# Patient Record
Sex: Female | Born: 1945
Health system: Southern US, Community
[De-identification: ages and names within clinical notes are randomized; demographics above are authoritative.]

## PROBLEM LIST (undated history)

## (undated) DIAGNOSIS — F329 Major depressive disorder, single episode, unspecified: Secondary | ICD-10-CM

## (undated) DIAGNOSIS — Z8543 Personal history of malignant neoplasm of ovary: Secondary | ICD-10-CM

## (undated) DIAGNOSIS — F32A Depression, unspecified: Secondary | ICD-10-CM

## (undated) DIAGNOSIS — C801 Malignant (primary) neoplasm, unspecified: Secondary | ICD-10-CM

## (undated) DIAGNOSIS — H269 Unspecified cataract: Secondary | ICD-10-CM

## (undated) DIAGNOSIS — N390 Urinary tract infection, site not specified: Secondary | ICD-10-CM

## (undated) HISTORY — DX: Unspecified cataract: H26.9

## (undated) HISTORY — DX: Personal history of malignant neoplasm of ovary: Z85.43

## (undated) HISTORY — PX: EYE SURGERY: SHX253

## (undated) HISTORY — DX: Urinary tract infection, site not specified: N39.0

## (undated) HISTORY — DX: Depression, unspecified: F32.A

## (undated) HISTORY — PX: ESOPHAGOGASTRODUODENOSCOPY: SHX1529

## (undated) HISTORY — PX: TONSILLECTOMY: SUR1361

## (undated) HISTORY — DX: Malignant (primary) neoplasm, unspecified: C80.1

---
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
## 1984-02-26 HISTORY — PX: ABDOMINAL HYSTERECTOMY: SHX81

## 1984-02-26 HISTORY — PX: APPENDECTOMY: SHX54

## 1985-02-25 HISTORY — PX: LAPAROTOMY: SHX154

## 2006-07-23 ENCOUNTER — Ambulatory Visit (HOSPITAL_COMMUNITY): Admission: RE | Admit: 2006-07-23 | Discharge: 2006-07-23 | Payer: Self-pay | Admitting: Specialist

## 2010-12-21 ENCOUNTER — Encounter (INDEPENDENT_AMBULATORY_CARE_PROVIDER_SITE_OTHER): Payer: Self-pay | Admitting: Surgery

## 2011-07-24 DIAGNOSIS — N3000 Acute cystitis without hematuria: Secondary | ICD-10-CM | POA: Diagnosis not present

## 2011-09-09 DIAGNOSIS — H1045 Other chronic allergic conjunctivitis: Secondary | ICD-10-CM | POA: Diagnosis not present

## 2011-09-09 DIAGNOSIS — H40019 Open angle with borderline findings, low risk, unspecified eye: Secondary | ICD-10-CM | POA: Diagnosis not present

## 2011-12-23 DIAGNOSIS — Z961 Presence of intraocular lens: Secondary | ICD-10-CM | POA: Diagnosis not present

## 2011-12-23 DIAGNOSIS — H43399 Other vitreous opacities, unspecified eye: Secondary | ICD-10-CM | POA: Diagnosis not present

## 2011-12-23 DIAGNOSIS — H35369 Drusen (degenerative) of macula, unspecified eye: Secondary | ICD-10-CM | POA: Diagnosis not present

## 2011-12-23 DIAGNOSIS — H40019 Open angle with borderline findings, low risk, unspecified eye: Secondary | ICD-10-CM | POA: Diagnosis not present

## 2011-12-31 DIAGNOSIS — Z23 Encounter for immunization: Secondary | ICD-10-CM | POA: Diagnosis not present

## 2012-02-03 DIAGNOSIS — E7800 Pure hypercholesterolemia, unspecified: Secondary | ICD-10-CM | POA: Diagnosis not present

## 2012-02-03 DIAGNOSIS — Z79899 Other long term (current) drug therapy: Secondary | ICD-10-CM | POA: Diagnosis not present

## 2012-02-03 DIAGNOSIS — G2581 Restless legs syndrome: Secondary | ICD-10-CM | POA: Diagnosis not present

## 2012-02-03 DIAGNOSIS — F429 Obsessive-compulsive disorder, unspecified: Secondary | ICD-10-CM | POA: Diagnosis not present

## 2012-04-30 DIAGNOSIS — Z79890 Hormone replacement therapy (postmenopausal): Secondary | ICD-10-CM | POA: Diagnosis not present

## 2012-04-30 DIAGNOSIS — C569 Malignant neoplasm of unspecified ovary: Secondary | ICD-10-CM | POA: Diagnosis not present

## 2012-04-30 DIAGNOSIS — Z1231 Encounter for screening mammogram for malignant neoplasm of breast: Secondary | ICD-10-CM | POA: Diagnosis not present

## 2012-09-16 DIAGNOSIS — D235 Other benign neoplasm of skin of trunk: Secondary | ICD-10-CM | POA: Diagnosis not present

## 2012-09-16 DIAGNOSIS — L570 Actinic keratosis: Secondary | ICD-10-CM | POA: Diagnosis not present

## 2012-09-16 DIAGNOSIS — L821 Other seborrheic keratosis: Secondary | ICD-10-CM | POA: Diagnosis not present

## 2012-09-16 DIAGNOSIS — D239 Other benign neoplasm of skin, unspecified: Secondary | ICD-10-CM | POA: Diagnosis not present

## 2012-09-16 DIAGNOSIS — D219 Benign neoplasm of connective and other soft tissue, unspecified: Secondary | ICD-10-CM | POA: Diagnosis not present

## 2012-11-30 DIAGNOSIS — Z23 Encounter for immunization: Secondary | ICD-10-CM | POA: Diagnosis not present

## 2013-03-11 DIAGNOSIS — Z79899 Other long term (current) drug therapy: Secondary | ICD-10-CM | POA: Diagnosis not present

## 2013-03-11 DIAGNOSIS — G2581 Restless legs syndrome: Secondary | ICD-10-CM | POA: Diagnosis not present

## 2013-03-11 DIAGNOSIS — F429 Obsessive-compulsive disorder, unspecified: Secondary | ICD-10-CM | POA: Diagnosis not present

## 2013-03-11 DIAGNOSIS — Z23 Encounter for immunization: Secondary | ICD-10-CM | POA: Diagnosis not present

## 2013-03-11 DIAGNOSIS — E7800 Pure hypercholesterolemia, unspecified: Secondary | ICD-10-CM | POA: Diagnosis not present

## 2013-03-30 DIAGNOSIS — L570 Actinic keratosis: Secondary | ICD-10-CM | POA: Diagnosis not present

## 2013-05-03 DIAGNOSIS — L570 Actinic keratosis: Secondary | ICD-10-CM | POA: Diagnosis not present

## 2013-05-13 DIAGNOSIS — Z23 Encounter for immunization: Secondary | ICD-10-CM | POA: Diagnosis not present

## 2013-05-13 DIAGNOSIS — C569 Malignant neoplasm of unspecified ovary: Secondary | ICD-10-CM | POA: Diagnosis not present

## 2013-05-13 DIAGNOSIS — Z01419 Encounter for gynecological examination (general) (routine) without abnormal findings: Secondary | ICD-10-CM | POA: Diagnosis not present

## 2013-05-13 DIAGNOSIS — Z79890 Hormone replacement therapy (postmenopausal): Secondary | ICD-10-CM | POA: Diagnosis not present

## 2013-05-13 DIAGNOSIS — Z1231 Encounter for screening mammogram for malignant neoplasm of breast: Secondary | ICD-10-CM | POA: Diagnosis not present

## 2013-05-13 DIAGNOSIS — Z0000 Encounter for general adult medical examination without abnormal findings: Secondary | ICD-10-CM | POA: Diagnosis not present

## 2013-05-13 DIAGNOSIS — Z8543 Personal history of malignant neoplasm of ovary: Secondary | ICD-10-CM | POA: Diagnosis not present

## 2013-06-07 DIAGNOSIS — L570 Actinic keratosis: Secondary | ICD-10-CM | POA: Diagnosis not present

## 2013-07-20 DIAGNOSIS — D219 Benign neoplasm of connective and other soft tissue, unspecified: Secondary | ICD-10-CM | POA: Diagnosis not present

## 2013-07-20 DIAGNOSIS — L570 Actinic keratosis: Secondary | ICD-10-CM | POA: Diagnosis not present

## 2013-07-20 DIAGNOSIS — D239 Other benign neoplasm of skin, unspecified: Secondary | ICD-10-CM | POA: Diagnosis not present

## 2013-07-22 DIAGNOSIS — D485 Neoplasm of uncertain behavior of skin: Secondary | ICD-10-CM | POA: Diagnosis not present

## 2013-07-22 DIAGNOSIS — D2110 Benign neoplasm of connective and other soft tissue of unspecified upper limb, including shoulder: Secondary | ICD-10-CM | POA: Diagnosis not present

## 2013-08-20 DIAGNOSIS — L821 Other seborrheic keratosis: Secondary | ICD-10-CM | POA: Diagnosis not present

## 2013-08-20 DIAGNOSIS — H35369 Drusen (degenerative) of macula, unspecified eye: Secondary | ICD-10-CM | POA: Diagnosis not present

## 2013-08-20 DIAGNOSIS — H524 Presbyopia: Secondary | ICD-10-CM | POA: Diagnosis not present

## 2013-08-20 DIAGNOSIS — H40019 Open angle with borderline findings, low risk, unspecified eye: Secondary | ICD-10-CM | POA: Diagnosis not present

## 2013-08-20 DIAGNOSIS — D1801 Hemangioma of skin and subcutaneous tissue: Secondary | ICD-10-CM | POA: Diagnosis not present

## 2013-08-20 DIAGNOSIS — Z961 Presence of intraocular lens: Secondary | ICD-10-CM | POA: Diagnosis not present

## 2013-08-20 DIAGNOSIS — H11159 Pinguecula, unspecified eye: Secondary | ICD-10-CM | POA: Diagnosis not present

## 2013-08-20 DIAGNOSIS — Q828 Other specified congenital malformations of skin: Secondary | ICD-10-CM | POA: Diagnosis not present

## 2013-08-20 DIAGNOSIS — H35039 Hypertensive retinopathy, unspecified eye: Secondary | ICD-10-CM | POA: Diagnosis not present

## 2013-10-15 DIAGNOSIS — N390 Urinary tract infection, site not specified: Secondary | ICD-10-CM | POA: Diagnosis not present

## 2013-11-26 DIAGNOSIS — Z23 Encounter for immunization: Secondary | ICD-10-CM | POA: Diagnosis not present

## 2014-04-28 DIAGNOSIS — Z01419 Encounter for gynecological examination (general) (routine) without abnormal findings: Secondary | ICD-10-CM | POA: Diagnosis not present

## 2014-04-28 DIAGNOSIS — R5383 Other fatigue: Secondary | ICD-10-CM | POA: Diagnosis not present

## 2014-04-28 DIAGNOSIS — Z79890 Hormone replacement therapy (postmenopausal): Secondary | ICD-10-CM | POA: Diagnosis not present

## 2014-04-28 DIAGNOSIS — C569 Malignant neoplasm of unspecified ovary: Secondary | ICD-10-CM | POA: Diagnosis not present

## 2014-04-28 DIAGNOSIS — Z79899 Other long term (current) drug therapy: Secondary | ICD-10-CM | POA: Diagnosis not present

## 2014-04-28 DIAGNOSIS — Z8543 Personal history of malignant neoplasm of ovary: Secondary | ICD-10-CM | POA: Diagnosis not present

## 2014-04-28 DIAGNOSIS — Z1329 Encounter for screening for other suspected endocrine disorder: Secondary | ICD-10-CM | POA: Diagnosis not present

## 2014-05-12 DIAGNOSIS — M7541 Impingement syndrome of right shoulder: Secondary | ICD-10-CM | POA: Insufficient documentation

## 2014-05-12 DIAGNOSIS — M25511 Pain in right shoulder: Secondary | ICD-10-CM

## 2014-05-12 DIAGNOSIS — M13811 Other specified arthritis, right shoulder: Secondary | ICD-10-CM | POA: Diagnosis not present

## 2014-05-12 DIAGNOSIS — M19011 Primary osteoarthritis, right shoulder: Secondary | ICD-10-CM | POA: Diagnosis not present

## 2014-05-12 HISTORY — DX: Impingement syndrome of right shoulder: M75.41

## 2014-05-12 HISTORY — DX: Pain in right shoulder: M25.511

## 2014-05-16 DIAGNOSIS — M75101 Unspecified rotator cuff tear or rupture of right shoulder, not specified as traumatic: Secondary | ICD-10-CM | POA: Diagnosis not present

## 2014-05-16 DIAGNOSIS — M7551 Bursitis of right shoulder: Secondary | ICD-10-CM | POA: Diagnosis not present

## 2014-05-16 DIAGNOSIS — M25511 Pain in right shoulder: Secondary | ICD-10-CM | POA: Diagnosis not present

## 2014-05-16 DIAGNOSIS — S4381XA Sprain of other specified parts of right shoulder girdle, initial encounter: Secondary | ICD-10-CM | POA: Diagnosis not present

## 2014-05-17 DIAGNOSIS — Z1231 Encounter for screening mammogram for malignant neoplasm of breast: Secondary | ICD-10-CM | POA: Diagnosis not present

## 2014-05-30 DIAGNOSIS — F42 Obsessive-compulsive disorder: Secondary | ICD-10-CM | POA: Diagnosis not present

## 2014-05-30 DIAGNOSIS — G2581 Restless legs syndrome: Secondary | ICD-10-CM | POA: Diagnosis not present

## 2014-05-30 DIAGNOSIS — Z0000 Encounter for general adult medical examination without abnormal findings: Secondary | ICD-10-CM | POA: Diagnosis not present

## 2014-05-30 DIAGNOSIS — E780 Pure hypercholesterolemia: Secondary | ICD-10-CM | POA: Diagnosis not present

## 2014-05-30 DIAGNOSIS — Z23 Encounter for immunization: Secondary | ICD-10-CM | POA: Diagnosis not present

## 2014-05-31 DIAGNOSIS — M7541 Impingement syndrome of right shoulder: Secondary | ICD-10-CM | POA: Diagnosis not present

## 2014-06-06 DIAGNOSIS — M25511 Pain in right shoulder: Secondary | ICD-10-CM | POA: Diagnosis not present

## 2014-06-10 DIAGNOSIS — M25511 Pain in right shoulder: Secondary | ICD-10-CM | POA: Diagnosis not present

## 2014-06-14 DIAGNOSIS — M25511 Pain in right shoulder: Secondary | ICD-10-CM | POA: Diagnosis not present

## 2014-06-21 DIAGNOSIS — M25511 Pain in right shoulder: Secondary | ICD-10-CM | POA: Diagnosis not present

## 2014-06-29 DIAGNOSIS — M25511 Pain in right shoulder: Secondary | ICD-10-CM | POA: Diagnosis not present

## 2014-08-23 DIAGNOSIS — H35033 Hypertensive retinopathy, bilateral: Secondary | ICD-10-CM | POA: Diagnosis not present

## 2014-08-23 DIAGNOSIS — Z961 Presence of intraocular lens: Secondary | ICD-10-CM | POA: Diagnosis not present

## 2014-08-23 DIAGNOSIS — H40013 Open angle with borderline findings, low risk, bilateral: Secondary | ICD-10-CM | POA: Diagnosis not present

## 2014-08-23 DIAGNOSIS — H11153 Pinguecula, bilateral: Secondary | ICD-10-CM | POA: Diagnosis not present

## 2014-08-23 DIAGNOSIS — H35369 Drusen (degenerative) of macula, unspecified eye: Secondary | ICD-10-CM | POA: Diagnosis not present

## 2014-09-16 DIAGNOSIS — L821 Other seborrheic keratosis: Secondary | ICD-10-CM | POA: Diagnosis not present

## 2014-09-16 DIAGNOSIS — D1801 Hemangioma of skin and subcutaneous tissue: Secondary | ICD-10-CM | POA: Diagnosis not present

## 2014-09-16 DIAGNOSIS — D3612 Benign neoplasm of peripheral nerves and autonomic nervous system, upper limb, including shoulder: Secondary | ICD-10-CM | POA: Diagnosis not present

## 2014-09-16 DIAGNOSIS — L814 Other melanin hyperpigmentation: Secondary | ICD-10-CM | POA: Diagnosis not present

## 2014-09-16 DIAGNOSIS — L570 Actinic keratosis: Secondary | ICD-10-CM | POA: Diagnosis not present

## 2014-09-16 DIAGNOSIS — L565 Disseminated superficial actinic porokeratosis (DSAP): Secondary | ICD-10-CM | POA: Diagnosis not present

## 2014-12-12 DIAGNOSIS — Z23 Encounter for immunization: Secondary | ICD-10-CM | POA: Diagnosis not present

## 2014-12-13 DIAGNOSIS — H40013 Open angle with borderline findings, low risk, bilateral: Secondary | ICD-10-CM | POA: Diagnosis not present

## 2014-12-13 DIAGNOSIS — H04123 Dry eye syndrome of bilateral lacrimal glands: Secondary | ICD-10-CM | POA: Diagnosis not present

## 2015-02-07 DIAGNOSIS — L2389 Allergic contact dermatitis due to other agents: Secondary | ICD-10-CM | POA: Diagnosis not present

## 2015-03-15 DIAGNOSIS — Z23 Encounter for immunization: Secondary | ICD-10-CM | POA: Diagnosis not present

## 2015-03-21 DIAGNOSIS — M25511 Pain in right shoulder: Secondary | ICD-10-CM | POA: Diagnosis not present

## 2015-03-23 DIAGNOSIS — R21 Rash and other nonspecific skin eruption: Secondary | ICD-10-CM | POA: Diagnosis not present

## 2015-04-26 DIAGNOSIS — E785 Hyperlipidemia, unspecified: Secondary | ICD-10-CM

## 2015-04-26 HISTORY — DX: Hyperlipidemia, unspecified: E78.5

## 2015-05-11 DIAGNOSIS — Z08 Encounter for follow-up examination after completed treatment for malignant neoplasm: Secondary | ICD-10-CM | POA: Diagnosis not present

## 2015-05-11 DIAGNOSIS — Z6821 Body mass index (BMI) 21.0-21.9, adult: Secondary | ICD-10-CM | POA: Diagnosis not present

## 2015-05-11 DIAGNOSIS — Z79899 Other long term (current) drug therapy: Secondary | ICD-10-CM | POA: Diagnosis not present

## 2015-05-11 DIAGNOSIS — Z01419 Encounter for gynecological examination (general) (routine) without abnormal findings: Secondary | ICD-10-CM | POA: Diagnosis not present

## 2015-05-11 DIAGNOSIS — Z79890 Hormone replacement therapy (postmenopausal): Secondary | ICD-10-CM | POA: Diagnosis not present

## 2015-05-11 DIAGNOSIS — C569 Malignant neoplasm of unspecified ovary: Secondary | ICD-10-CM | POA: Diagnosis not present

## 2015-05-11 DIAGNOSIS — E785 Hyperlipidemia, unspecified: Secondary | ICD-10-CM | POA: Diagnosis not present

## 2015-05-11 DIAGNOSIS — Z8543 Personal history of malignant neoplasm of ovary: Secondary | ICD-10-CM | POA: Diagnosis not present

## 2015-05-11 DIAGNOSIS — E039 Hypothyroidism, unspecified: Secondary | ICD-10-CM | POA: Diagnosis not present

## 2015-05-23 DIAGNOSIS — Z1231 Encounter for screening mammogram for malignant neoplasm of breast: Secondary | ICD-10-CM | POA: Diagnosis not present

## 2015-08-03 DIAGNOSIS — E038 Other specified hypothyroidism: Secondary | ICD-10-CM | POA: Diagnosis not present

## 2015-08-03 DIAGNOSIS — E782 Mixed hyperlipidemia: Secondary | ICD-10-CM | POA: Diagnosis not present

## 2015-08-03 DIAGNOSIS — G5600 Carpal tunnel syndrome, unspecified upper limb: Secondary | ICD-10-CM | POA: Diagnosis not present

## 2015-08-03 DIAGNOSIS — E018 Other iodine-deficiency related thyroid disorders and allied conditions: Secondary | ICD-10-CM | POA: Diagnosis not present

## 2015-08-03 DIAGNOSIS — Z13820 Encounter for screening for osteoporosis: Secondary | ICD-10-CM | POA: Diagnosis not present

## 2015-08-03 DIAGNOSIS — Z1211 Encounter for screening for malignant neoplasm of colon: Secondary | ICD-10-CM | POA: Diagnosis not present

## 2015-08-03 DIAGNOSIS — F5105 Insomnia due to other mental disorder: Secondary | ICD-10-CM | POA: Diagnosis not present

## 2015-08-21 DIAGNOSIS — Z1211 Encounter for screening for malignant neoplasm of colon: Secondary | ICD-10-CM | POA: Diagnosis not present

## 2015-08-21 DIAGNOSIS — Z780 Asymptomatic menopausal state: Secondary | ICD-10-CM | POA: Diagnosis not present

## 2015-08-21 DIAGNOSIS — Z13820 Encounter for screening for osteoporosis: Secondary | ICD-10-CM | POA: Diagnosis not present

## 2015-08-21 DIAGNOSIS — Z1212 Encounter for screening for malignant neoplasm of rectum: Secondary | ICD-10-CM | POA: Diagnosis not present

## 2015-09-11 DIAGNOSIS — E782 Mixed hyperlipidemia: Secondary | ICD-10-CM | POA: Diagnosis not present

## 2015-09-11 DIAGNOSIS — G5603 Carpal tunnel syndrome, bilateral upper limbs: Secondary | ICD-10-CM | POA: Diagnosis not present

## 2015-09-11 DIAGNOSIS — E038 Other specified hypothyroidism: Secondary | ICD-10-CM | POA: Diagnosis not present

## 2015-09-11 DIAGNOSIS — W5501XA Bitten by cat, initial encounter: Secondary | ICD-10-CM | POA: Diagnosis not present

## 2015-09-14 DIAGNOSIS — H40013 Open angle with borderline findings, low risk, bilateral: Secondary | ICD-10-CM | POA: Diagnosis not present

## 2015-09-14 DIAGNOSIS — H35033 Hypertensive retinopathy, bilateral: Secondary | ICD-10-CM | POA: Diagnosis not present

## 2015-09-14 DIAGNOSIS — H35363 Drusen (degenerative) of macula, bilateral: Secondary | ICD-10-CM | POA: Diagnosis not present

## 2015-09-14 DIAGNOSIS — Z961 Presence of intraocular lens: Secondary | ICD-10-CM | POA: Diagnosis not present

## 2015-09-22 DIAGNOSIS — L738 Other specified follicular disorders: Secondary | ICD-10-CM | POA: Diagnosis not present

## 2015-09-22 DIAGNOSIS — L821 Other seborrheic keratosis: Secondary | ICD-10-CM | POA: Diagnosis not present

## 2015-09-22 DIAGNOSIS — D3612 Benign neoplasm of peripheral nerves and autonomic nervous system, upper limb, including shoulder: Secondary | ICD-10-CM | POA: Diagnosis not present

## 2015-09-22 DIAGNOSIS — R21 Rash and other nonspecific skin eruption: Secondary | ICD-10-CM | POA: Diagnosis not present

## 2016-03-11 DIAGNOSIS — R7301 Impaired fasting glucose: Secondary | ICD-10-CM | POA: Diagnosis not present

## 2016-03-11 DIAGNOSIS — G5603 Carpal tunnel syndrome, bilateral upper limbs: Secondary | ICD-10-CM | POA: Diagnosis not present

## 2016-03-11 DIAGNOSIS — E782 Mixed hyperlipidemia: Secondary | ICD-10-CM | POA: Diagnosis not present

## 2016-03-11 DIAGNOSIS — E038 Other specified hypothyroidism: Secondary | ICD-10-CM | POA: Diagnosis not present

## 2016-03-11 DIAGNOSIS — F5105 Insomnia due to other mental disorder: Secondary | ICD-10-CM | POA: Diagnosis not present

## 2016-03-28 DIAGNOSIS — G5603 Carpal tunnel syndrome, bilateral upper limbs: Secondary | ICD-10-CM | POA: Diagnosis not present

## 2016-03-28 DIAGNOSIS — R200 Anesthesia of skin: Secondary | ICD-10-CM | POA: Diagnosis not present

## 2016-05-09 DIAGNOSIS — Z01419 Encounter for gynecological examination (general) (routine) without abnormal findings: Secondary | ICD-10-CM | POA: Diagnosis not present

## 2016-05-09 DIAGNOSIS — Z1231 Encounter for screening mammogram for malignant neoplasm of breast: Secondary | ICD-10-CM | POA: Diagnosis not present

## 2016-05-09 DIAGNOSIS — Z79899 Other long term (current) drug therapy: Secondary | ICD-10-CM | POA: Diagnosis not present

## 2016-05-09 DIAGNOSIS — Z79890 Hormone replacement therapy (postmenopausal): Secondary | ICD-10-CM | POA: Diagnosis not present

## 2016-05-09 DIAGNOSIS — C569 Malignant neoplasm of unspecified ovary: Secondary | ICD-10-CM | POA: Diagnosis not present

## 2016-05-09 DIAGNOSIS — Z6822 Body mass index (BMI) 22.0-22.9, adult: Secondary | ICD-10-CM | POA: Diagnosis not present

## 2016-05-09 DIAGNOSIS — Z8543 Personal history of malignant neoplasm of ovary: Secondary | ICD-10-CM | POA: Diagnosis not present

## 2016-06-04 DIAGNOSIS — C569 Malignant neoplasm of unspecified ovary: Secondary | ICD-10-CM | POA: Diagnosis not present

## 2016-06-11 DIAGNOSIS — Z6821 Body mass index (BMI) 21.0-21.9, adult: Secondary | ICD-10-CM | POA: Diagnosis not present

## 2016-06-11 DIAGNOSIS — E038 Other specified hypothyroidism: Secondary | ICD-10-CM | POA: Diagnosis not present

## 2016-06-11 DIAGNOSIS — Z0000 Encounter for general adult medical examination without abnormal findings: Secondary | ICD-10-CM | POA: Diagnosis not present

## 2016-06-20 DIAGNOSIS — E782 Mixed hyperlipidemia: Secondary | ICD-10-CM | POA: Diagnosis not present

## 2016-06-21 DIAGNOSIS — M79641 Pain in right hand: Secondary | ICD-10-CM | POA: Diagnosis not present

## 2016-06-21 DIAGNOSIS — M79642 Pain in left hand: Secondary | ICD-10-CM | POA: Diagnosis not present

## 2016-06-21 DIAGNOSIS — M180 Bilateral primary osteoarthritis of first carpometacarpal joints: Secondary | ICD-10-CM | POA: Diagnosis not present

## 2016-09-09 DIAGNOSIS — Z79899 Other long term (current) drug therapy: Secondary | ICD-10-CM | POA: Diagnosis not present

## 2016-09-09 DIAGNOSIS — E782 Mixed hyperlipidemia: Secondary | ICD-10-CM | POA: Diagnosis not present

## 2016-09-09 DIAGNOSIS — E038 Other specified hypothyroidism: Secondary | ICD-10-CM | POA: Diagnosis not present

## 2016-09-11 DIAGNOSIS — L821 Other seborrheic keratosis: Secondary | ICD-10-CM | POA: Diagnosis not present

## 2016-09-11 DIAGNOSIS — L814 Other melanin hyperpigmentation: Secondary | ICD-10-CM | POA: Diagnosis not present

## 2016-09-11 DIAGNOSIS — D692 Other nonthrombocytopenic purpura: Secondary | ICD-10-CM | POA: Diagnosis not present

## 2016-09-11 DIAGNOSIS — L570 Actinic keratosis: Secondary | ICD-10-CM | POA: Diagnosis not present

## 2016-09-11 DIAGNOSIS — L565 Disseminated superficial actinic porokeratosis (DSAP): Secondary | ICD-10-CM | POA: Diagnosis not present

## 2016-09-11 DIAGNOSIS — D1801 Hemangioma of skin and subcutaneous tissue: Secondary | ICD-10-CM | POA: Diagnosis not present

## 2016-09-11 DIAGNOSIS — D3612 Benign neoplasm of peripheral nerves and autonomic nervous system, upper limb, including shoulder: Secondary | ICD-10-CM | POA: Diagnosis not present

## 2016-10-01 DIAGNOSIS — H04123 Dry eye syndrome of bilateral lacrimal glands: Secondary | ICD-10-CM | POA: Diagnosis not present

## 2016-10-01 DIAGNOSIS — H35033 Hypertensive retinopathy, bilateral: Secondary | ICD-10-CM | POA: Diagnosis not present

## 2016-10-01 DIAGNOSIS — H40013 Open angle with borderline findings, low risk, bilateral: Secondary | ICD-10-CM | POA: Diagnosis not present

## 2016-10-01 DIAGNOSIS — H35363 Drusen (degenerative) of macula, bilateral: Secondary | ICD-10-CM | POA: Diagnosis not present

## 2016-12-11 DIAGNOSIS — Z23 Encounter for immunization: Secondary | ICD-10-CM | POA: Diagnosis not present

## 2017-04-08 DIAGNOSIS — H40013 Open angle with borderline findings, low risk, bilateral: Secondary | ICD-10-CM | POA: Diagnosis not present

## 2017-05-15 DIAGNOSIS — Z6822 Body mass index (BMI) 22.0-22.9, adult: Secondary | ICD-10-CM | POA: Diagnosis not present

## 2017-05-15 DIAGNOSIS — Z1231 Encounter for screening mammogram for malignant neoplasm of breast: Secondary | ICD-10-CM | POA: Diagnosis not present

## 2017-05-15 DIAGNOSIS — Z8543 Personal history of malignant neoplasm of ovary: Secondary | ICD-10-CM | POA: Diagnosis not present

## 2017-05-15 DIAGNOSIS — Z08 Encounter for follow-up examination after completed treatment for malignant neoplasm: Secondary | ICD-10-CM | POA: Diagnosis not present

## 2017-05-15 DIAGNOSIS — C569 Malignant neoplasm of unspecified ovary: Secondary | ICD-10-CM | POA: Diagnosis not present

## 2017-05-15 DIAGNOSIS — Z79899 Other long term (current) drug therapy: Secondary | ICD-10-CM | POA: Diagnosis not present

## 2017-05-15 DIAGNOSIS — Z79890 Hormone replacement therapy (postmenopausal): Secondary | ICD-10-CM | POA: Diagnosis not present

## 2017-05-15 DIAGNOSIS — Z9221 Personal history of antineoplastic chemotherapy: Secondary | ICD-10-CM | POA: Diagnosis not present

## 2017-06-23 DIAGNOSIS — Z6821 Body mass index (BMI) 21.0-21.9, adult: Secondary | ICD-10-CM | POA: Diagnosis not present

## 2017-06-23 DIAGNOSIS — E038 Other specified hypothyroidism: Secondary | ICD-10-CM | POA: Diagnosis not present

## 2017-06-23 DIAGNOSIS — R000 Tachycardia, unspecified: Secondary | ICD-10-CM | POA: Diagnosis not present

## 2017-06-23 DIAGNOSIS — Z0001 Encounter for general adult medical examination with abnormal findings: Secondary | ICD-10-CM | POA: Diagnosis not present

## 2017-06-23 DIAGNOSIS — E782 Mixed hyperlipidemia: Secondary | ICD-10-CM | POA: Diagnosis not present

## 2017-06-24 DIAGNOSIS — R000 Tachycardia, unspecified: Secondary | ICD-10-CM | POA: Diagnosis not present

## 2017-06-24 DIAGNOSIS — E782 Mixed hyperlipidemia: Secondary | ICD-10-CM | POA: Diagnosis not present

## 2017-06-24 DIAGNOSIS — E038 Other specified hypothyroidism: Secondary | ICD-10-CM | POA: Diagnosis not present

## 2017-07-08 DIAGNOSIS — L821 Other seborrheic keratosis: Secondary | ICD-10-CM | POA: Diagnosis not present

## 2017-07-08 DIAGNOSIS — L565 Disseminated superficial actinic porokeratosis (DSAP): Secondary | ICD-10-CM | POA: Diagnosis not present

## 2017-07-08 DIAGNOSIS — D1801 Hemangioma of skin and subcutaneous tissue: Secondary | ICD-10-CM | POA: Diagnosis not present

## 2017-07-28 DIAGNOSIS — Z13820 Encounter for screening for osteoporosis: Secondary | ICD-10-CM | POA: Diagnosis not present

## 2017-07-28 DIAGNOSIS — N959 Unspecified menopausal and perimenopausal disorder: Secondary | ICD-10-CM | POA: Diagnosis not present

## 2017-07-28 LAB — HM DEXA SCAN: HM Dexa Scan: NORMAL

## 2017-10-02 DIAGNOSIS — E038 Other specified hypothyroidism: Secondary | ICD-10-CM | POA: Diagnosis not present

## 2017-10-02 DIAGNOSIS — M25562 Pain in left knee: Secondary | ICD-10-CM | POA: Diagnosis not present

## 2017-10-02 DIAGNOSIS — E782 Mixed hyperlipidemia: Secondary | ICD-10-CM | POA: Diagnosis not present

## 2017-11-14 DIAGNOSIS — Z23 Encounter for immunization: Secondary | ICD-10-CM | POA: Diagnosis not present

## 2018-02-23 DIAGNOSIS — M25551 Pain in right hip: Secondary | ICD-10-CM | POA: Diagnosis not present

## 2018-02-23 DIAGNOSIS — M25552 Pain in left hip: Secondary | ICD-10-CM | POA: Diagnosis not present

## 2018-02-23 DIAGNOSIS — M545 Low back pain: Secondary | ICD-10-CM | POA: Diagnosis not present

## 2018-02-27 DIAGNOSIS — M545 Low back pain: Secondary | ICD-10-CM | POA: Diagnosis not present

## 2018-02-27 DIAGNOSIS — M5136 Other intervertebral disc degeneration, lumbar region: Secondary | ICD-10-CM | POA: Diagnosis not present

## 2018-02-27 DIAGNOSIS — M25551 Pain in right hip: Secondary | ICD-10-CM | POA: Diagnosis not present

## 2018-02-27 DIAGNOSIS — M160 Bilateral primary osteoarthritis of hip: Secondary | ICD-10-CM | POA: Diagnosis not present

## 2018-03-10 DIAGNOSIS — M545 Low back pain: Secondary | ICD-10-CM | POA: Diagnosis not present

## 2018-03-10 DIAGNOSIS — M5416 Radiculopathy, lumbar region: Secondary | ICD-10-CM | POA: Diagnosis not present

## 2018-03-10 DIAGNOSIS — M549 Dorsalgia, unspecified: Secondary | ICD-10-CM | POA: Insufficient documentation

## 2018-03-10 DIAGNOSIS — G8929 Other chronic pain: Secondary | ICD-10-CM

## 2018-03-10 DIAGNOSIS — E039 Hypothyroidism, unspecified: Secondary | ICD-10-CM

## 2018-03-10 HISTORY — DX: Other chronic pain: G89.29

## 2018-03-10 HISTORY — DX: Hypothyroidism, unspecified: E03.9

## 2018-03-12 DIAGNOSIS — M545 Low back pain: Secondary | ICD-10-CM | POA: Diagnosis not present

## 2018-03-16 DIAGNOSIS — M5416 Radiculopathy, lumbar region: Secondary | ICD-10-CM | POA: Diagnosis not present

## 2018-03-17 DIAGNOSIS — M545 Low back pain: Secondary | ICD-10-CM | POA: Diagnosis not present

## 2018-03-17 DIAGNOSIS — M5416 Radiculopathy, lumbar region: Secondary | ICD-10-CM | POA: Diagnosis not present

## 2018-03-23 DIAGNOSIS — M545 Low back pain: Secondary | ICD-10-CM | POA: Diagnosis not present

## 2018-03-23 DIAGNOSIS — M5416 Radiculopathy, lumbar region: Secondary | ICD-10-CM | POA: Diagnosis not present

## 2018-03-27 DIAGNOSIS — M5416 Radiculopathy, lumbar region: Secondary | ICD-10-CM | POA: Diagnosis not present

## 2018-03-27 DIAGNOSIS — M545 Low back pain: Secondary | ICD-10-CM | POA: Diagnosis not present

## 2018-03-30 DIAGNOSIS — M545 Low back pain: Secondary | ICD-10-CM | POA: Diagnosis not present

## 2018-03-30 DIAGNOSIS — M5416 Radiculopathy, lumbar region: Secondary | ICD-10-CM | POA: Diagnosis not present

## 2018-04-03 DIAGNOSIS — M5416 Radiculopathy, lumbar region: Secondary | ICD-10-CM | POA: Diagnosis not present

## 2018-04-03 DIAGNOSIS — M545 Low back pain: Secondary | ICD-10-CM | POA: Diagnosis not present

## 2018-04-06 DIAGNOSIS — E038 Other specified hypothyroidism: Secondary | ICD-10-CM | POA: Diagnosis not present

## 2018-04-06 DIAGNOSIS — E782 Mixed hyperlipidemia: Secondary | ICD-10-CM | POA: Diagnosis not present

## 2018-04-08 DIAGNOSIS — M545 Low back pain: Secondary | ICD-10-CM | POA: Diagnosis not present

## 2018-04-08 DIAGNOSIS — M5416 Radiculopathy, lumbar region: Secondary | ICD-10-CM | POA: Diagnosis not present

## 2018-04-09 DIAGNOSIS — C530 Malignant neoplasm of endocervix: Secondary | ICD-10-CM | POA: Diagnosis not present

## 2018-04-09 DIAGNOSIS — C539 Malignant neoplasm of cervix uteri, unspecified: Secondary | ICD-10-CM | POA: Diagnosis not present

## 2018-04-09 DIAGNOSIS — Z6823 Body mass index (BMI) 23.0-23.9, adult: Secondary | ICD-10-CM | POA: Diagnosis not present

## 2018-04-20 DIAGNOSIS — M5416 Radiculopathy, lumbar region: Secondary | ICD-10-CM | POA: Diagnosis not present

## 2018-04-20 DIAGNOSIS — M545 Low back pain: Secondary | ICD-10-CM | POA: Diagnosis not present

## 2018-04-27 DIAGNOSIS — G8929 Other chronic pain: Secondary | ICD-10-CM | POA: Diagnosis not present

## 2018-07-01 DIAGNOSIS — Z6821 Body mass index (BMI) 21.0-21.9, adult: Secondary | ICD-10-CM | POA: Diagnosis not present

## 2018-07-01 DIAGNOSIS — Z0001 Encounter for general adult medical examination with abnormal findings: Secondary | ICD-10-CM | POA: Diagnosis not present

## 2018-07-01 DIAGNOSIS — E038 Other specified hypothyroidism: Secondary | ICD-10-CM | POA: Diagnosis not present

## 2018-07-01 DIAGNOSIS — E782 Mixed hyperlipidemia: Secondary | ICD-10-CM | POA: Diagnosis not present

## 2018-07-02 DIAGNOSIS — D3612 Benign neoplasm of peripheral nerves and autonomic nervous system, upper limb, including shoulder: Secondary | ICD-10-CM | POA: Diagnosis not present

## 2018-07-02 DIAGNOSIS — L570 Actinic keratosis: Secondary | ICD-10-CM | POA: Diagnosis not present

## 2018-07-02 DIAGNOSIS — L821 Other seborrheic keratosis: Secondary | ICD-10-CM | POA: Diagnosis not present

## 2018-07-02 DIAGNOSIS — L738 Other specified follicular disorders: Secondary | ICD-10-CM | POA: Diagnosis not present

## 2018-07-02 DIAGNOSIS — L565 Disseminated superficial actinic porokeratosis (DSAP): Secondary | ICD-10-CM | POA: Diagnosis not present

## 2018-07-06 DIAGNOSIS — E038 Other specified hypothyroidism: Secondary | ICD-10-CM | POA: Diagnosis not present

## 2018-07-06 DIAGNOSIS — E782 Mixed hyperlipidemia: Secondary | ICD-10-CM | POA: Diagnosis not present

## 2018-07-07 DIAGNOSIS — R7301 Impaired fasting glucose: Secondary | ICD-10-CM | POA: Diagnosis not present

## 2018-08-21 DIAGNOSIS — Z1231 Encounter for screening mammogram for malignant neoplasm of breast: Secondary | ICD-10-CM | POA: Diagnosis not present

## 2018-09-07 DIAGNOSIS — Z1211 Encounter for screening for malignant neoplasm of colon: Secondary | ICD-10-CM | POA: Diagnosis not present

## 2018-09-07 LAB — COLOGUARD: Cologuard: NEGATIVE

## 2018-10-08 DIAGNOSIS — E782 Mixed hyperlipidemia: Secondary | ICD-10-CM | POA: Diagnosis not present

## 2018-10-09 DIAGNOSIS — H40013 Open angle with borderline findings, low risk, bilateral: Secondary | ICD-10-CM | POA: Diagnosis not present

## 2018-10-09 DIAGNOSIS — H35033 Hypertensive retinopathy, bilateral: Secondary | ICD-10-CM | POA: Diagnosis not present

## 2018-10-09 DIAGNOSIS — H35363 Drusen (degenerative) of macula, bilateral: Secondary | ICD-10-CM | POA: Diagnosis not present

## 2018-10-09 DIAGNOSIS — H43813 Vitreous degeneration, bilateral: Secondary | ICD-10-CM | POA: Diagnosis not present

## 2018-10-12 DIAGNOSIS — E782 Mixed hyperlipidemia: Secondary | ICD-10-CM | POA: Diagnosis not present

## 2018-10-12 DIAGNOSIS — E038 Other specified hypothyroidism: Secondary | ICD-10-CM | POA: Diagnosis not present

## 2018-10-12 DIAGNOSIS — F419 Anxiety disorder, unspecified: Secondary | ICD-10-CM | POA: Diagnosis not present

## 2018-11-30 DIAGNOSIS — N3001 Acute cystitis with hematuria: Secondary | ICD-10-CM | POA: Diagnosis not present

## 2019-01-18 DIAGNOSIS — E782 Mixed hyperlipidemia: Secondary | ICD-10-CM | POA: Diagnosis not present

## 2019-01-18 DIAGNOSIS — M5431 Sciatica, right side: Secondary | ICD-10-CM | POA: Diagnosis not present

## 2019-01-18 DIAGNOSIS — M545 Low back pain: Secondary | ICD-10-CM | POA: Diagnosis not present

## 2019-01-26 DIAGNOSIS — H04213 Epiphora due to excess lacrimation, bilateral lacrimal glands: Secondary | ICD-10-CM | POA: Diagnosis not present

## 2019-01-26 DIAGNOSIS — H40013 Open angle with borderline findings, low risk, bilateral: Secondary | ICD-10-CM | POA: Diagnosis not present

## 2019-01-26 DIAGNOSIS — H04123 Dry eye syndrome of bilateral lacrimal glands: Secondary | ICD-10-CM | POA: Diagnosis not present

## 2019-01-26 DIAGNOSIS — H11153 Pinguecula, bilateral: Secondary | ICD-10-CM | POA: Diagnosis not present

## 2019-02-26 DIAGNOSIS — I10 Essential (primary) hypertension: Secondary | ICD-10-CM

## 2019-02-26 HISTORY — DX: Essential (primary) hypertension: I10

## 2019-03-04 DIAGNOSIS — N3000 Acute cystitis without hematuria: Secondary | ICD-10-CM | POA: Diagnosis not present

## 2019-03-10 DIAGNOSIS — I160 Hypertensive urgency: Secondary | ICD-10-CM | POA: Diagnosis not present

## 2019-03-10 DIAGNOSIS — R5383 Other fatigue: Secondary | ICD-10-CM | POA: Diagnosis not present

## 2019-03-19 ENCOUNTER — Encounter: Payer: Self-pay | Admitting: *Deleted

## 2019-03-19 ENCOUNTER — Other Ambulatory Visit: Payer: Self-pay

## 2019-03-19 ENCOUNTER — Ambulatory Visit (INDEPENDENT_AMBULATORY_CARE_PROVIDER_SITE_OTHER): Payer: Medicare Other | Admitting: Cardiology

## 2019-03-19 DIAGNOSIS — R0609 Other forms of dyspnea: Secondary | ICD-10-CM

## 2019-03-19 DIAGNOSIS — E785 Hyperlipidemia, unspecified: Secondary | ICD-10-CM

## 2019-03-19 DIAGNOSIS — R0600 Dyspnea, unspecified: Secondary | ICD-10-CM | POA: Diagnosis not present

## 2019-03-19 DIAGNOSIS — E782 Mixed hyperlipidemia: Secondary | ICD-10-CM

## 2019-03-19 DIAGNOSIS — I10 Essential (primary) hypertension: Secondary | ICD-10-CM

## 2019-03-19 HISTORY — DX: Dyspnea, unspecified: R06.00

## 2019-03-19 HISTORY — DX: Mixed hyperlipidemia: E78.2

## 2019-03-19 HISTORY — DX: Other forms of dyspnea: R06.09

## 2019-03-19 HISTORY — DX: Essential (primary) hypertension: I10

## 2019-03-19 NOTE — Progress Notes (Signed)
Cardiology Consultation:    Date:  03/19/2019   ID:  Dana [name], DOB [birth year], MRN [id number]  PCP:  Rochel Brome, MD  Cardiologist:  Jenne Campus, MD   Referring MD: No ref. provider found   Chief Complaint  Patient presents with  . Hypertension    Up and down    History of Present Illness:    Dana [name] is a 74 y.o. female who is being seen today for the evaluation of essential hypertension at the request of No ref. provider found.  Recently she had urinary tract infection x2.  She went to her primary care physician she was identified to have high blood pressure.  She never had history of hypertension.  Since that time she has been checking her blood pressure on the regular basis and what she see a number all over the place blood pressure could be 180/97 there were also some numbers when her blood pressure dropped significantly to 103/58.  She denies having any chest pain tightness squeezing pressure burning chest she is to exercise on the regular basis by walking however stopped a year ago because of COVID-19 infection.  Denies having any dizziness passing out.  But is overall very concerned about her health and is why she would like to be established as a patient. Does not smoke Does have some family history of heart problem but not premature. Does have dyslipidemia I do not have results of it but she tells me that her HDL is always high but LDL is high as well but the ratio is pretty good.  Past Medical History:  Diagnosis Date  . Chronic back pain 03/10/2018  . Depression   . History of ovarian cancer   . Hyperlipidemia 04/2015  . Hypothyroidism 03/10/2018  . Impingement syndrome of right shoulder 05/12/2014  . Right shoulder pain 05/12/2014    Past Surgical History:  Procedure Laterality Date  . ABDOMINAL HYSTERECTOMY  1986   Total, also had chemo  . APPENDECTOMY  1986  . LAPAROTOMY  1987   To rule out recurrent ovarian cancer  . TONSILLECTOMY       Current Medications: Current Meds  Medication Sig  . ALPRAZolam (XANAX) 0.25 MG tablet Take 0.25 mg by mouth daily as needed.  Marland Kitchen atorvastatin (LIPITOR) 10 MG tablet Take 10 mg by mouth daily.  Marland Kitchen BYSTOLIC 20 MG TABS Take 1 tablet by mouth daily.  Marland Kitchen levothyroxine (SYNTHROID) 88 MCG tablet 88 mcg daily.  . naproxen (NAPROSYN) 500 MG tablet as needed.  Marland Kitchen PREMARIN 0.625 MG tablet Take 0.625 mg by mouth daily.     Allergies:   Pravastatin and Penicillins   Social History   Socioeconomic History  . Marital status: Married    Spouse name: Not on file  . Number of children: Not on file  . Years of education: Not on file  . Highest education level: Not on file  Occupational History  . Not on file  Tobacco Use  . Smoking status: Never Smoker  . Smokeless tobacco: Never Used  Substance and Sexual Activity  . Alcohol use: Not Currently    Comment: occasionally  . Drug use: Never  . Sexual activity: Not on file  Other Topics Concern  . Not on file  Social History Narrative  . Not on file   Social Determinants of Health   Financial Resource Strain:   . Difficulty of Paying Living Expenses: Not on file  Food Insecurity:   . Worried About  Running Out of Food in the Last Year: Not on file  . Ran Out of Food in the Last Year: Not on file  Transportation Needs:   . Lack of Transportation (Medical): Not on file  . Lack of Transportation (Non-Medical): Not on file  Physical Activity:   . Days of Exercise per Week: Not on file  . Minutes of Exercise per Session: Not on file  Stress:   . Feeling of Stress : Not on file  Social Connections:   . Frequency of Communication with Friends and Family: Not on file  . Frequency of Social Gatherings with Friends and Family: Not on file  . Attends Religious Services: Not on file  . Active Member of Clubs or Organizations: Not on file  . Attends Archivist Meetings: Not on file  . Marital Status: Not on file     Family History:  The patient's family history includes Congestive Heart Failure in her mother. ROS:   Please see the history of present illness.    All 14 point review of systems negative except as described per history of present illness.  EKGs/Labs/Other Studies Reviewed:    The following studies were reviewed today: Normal sinus rhythm, normal P interval, there are small Q waves inferiorly, nonspecific ST segment changes    Recent Labs: No results found for requested labs within last 8760 hours.  Recent Lipid Panel No results found for: CHOL, TRIG, HDL, CHOLHDL, VLDL, LDLCALC, LDLDIRECT  Physical Exam:    VS:  BP (!) 150/90 (BP Location: Left Arm, Patient Position: Sitting, Cuff Size: Normal)   Pulse 64   Ht 5\' 5"  (1.651 m)   Wt 138 lb (62.6 kg)   SpO2 97%   BMI 22.96 kg/m     Wt Readings from Last 3 Encounters:  03/19/19 138 lb (62.6 kg)     GEN:  Well nourished, well developed in no acute distress HEENT: Normal NECK: No JVD; No carotid bruits LYMPHATICS: No lymphadenopathy CARDIAC: RRR, no murmurs, no rubs, no gallops RESPIRATORY:  Clear to auscultation without rales, wheezing or rhonchi  ABDOMEN: Soft, non-tender, non-distended MUSCULOSKELETAL:  No edema; No deformity  SKIN: Warm and dry NEUROLOGIC:  Alert and oriented x 3 PSYCHIATRIC:  Normal affect   ASSESSMENT:    1. Essential hypertension   2. Dyslipidemia   3. Dyspnea on exertion    PLAN:    In order of problems listed above:  1. Essential hypertension which is a new diagnosis interestingly she did not have hypertension before.  We did talk in length about nonpharmacological way to manage her blood pressure which is low salt avoidance, exercise on the regular basis.  She does not snore.  She is already on Bystolic which I will continue.  I asked her to check her blood pressure on the regular basis at least twice daily and bring results to me next time.  I will schedule her to have echocardiogram to assess left  ventricle ejection fraction more importantly look of left ventricle hypertrophy.  Based on that we have to decide about aggressiveness of the therapy.  In terms of etiology of her hypertension probably is idiopathic but what strikes me is the fact that she never had high blood pressure and now suddenly she got quite significant difficulty with it on top of that she does have history of urinary tract infection.  I will schedule her to have renal ultrasound to look at her renal arteries. 2. Dyslipidemia we will request copy  of her fasting lipid profile from primary care physician.  In the future we may do some with more intense work-up trying to stratify her risk for coronary artery disease to decide about potentially therapy. 3. Dyspnea on exertion again echocardiogram will be done.  I asked her to exercise on a regular basis but gradually slowly increasing distance.  Again we talked in length about measures that we can take to prevent her from having hypertension she is very receptive to that.   Medication Adjustments/Labs and Tests Ordered: Current medicines are reviewed at length with the patient today.  Concerns regarding medicines are outlined above.  Orders Placed This Encounter  Procedures  . ECHOCARDIOGRAM COMPLETE  . VAS US RENAL ARTERY DUPLEX   No orders of the defined types were placed in this encounter.   Signed, Park Liter, MD, [facility]. 03/19/2019 11:19 AM    [place]

## 2019-03-19 NOTE — Patient Instructions (Signed)
Medication Instructions:  No change *If you need a refill on your cardiac medications before your next appointment, please call your pharmacy*  Lab Work: none If you have labs (blood work) drawn today and your tests are completely normal, you will receive your results only by: Marland Kitchen MyChart Message (if you have MyChart) OR . A paper copy in the mail If you have any lab test that is abnormal or we need to change your treatment, we will call you to review the results.  Testing/Procedures: Renal Duplex Your physician has requested that you have a renal artery duplex. During this test, an ultrasound is used to evaluate blood flow to the kidneys. Allow one hour for this exam. Do not eat after midnight the day before and avoid carbonated beverages. Take your medications as you usually do.   Echocardiogram Your physician has requested that you have an echocardiogram. Echocardiography is a painless test that uses sound waves to create images of your heart. It provides your doctor with information about the size and shape of your heart and how well your heart's chambers and valves are working. This procedure takes approximately one hour. There are no restrictions for this procedure.    Follow-Up: At [facility], you and your health needs are our priority.  As part of our continuing mission to provide you with exceptional heart care, we have created designated Provider Care Teams.  These Care Teams include your primary Cardiologist (physician) and Advanced Practice Providers (APPs -  Physician Assistants and Nurse Practitioners) who all work together to provide you with the care you need, when you need it.  Your next appointment:   1 month(s)  The format for your next appointment:   Either In Person or Virtual  Provider:   Agustin Cree  Other Instructions  Stay Well

## 2019-03-23 ENCOUNTER — Ambulatory Visit (HOSPITAL_BASED_OUTPATIENT_CLINIC_OR_DEPARTMENT_OTHER)
Admission: RE | Admit: 2019-03-23 | Discharge: 2019-03-23 | Disposition: A | Discharge status: Home / Self Care | Payer: Medicare Other | Source: Ambulatory Visit | Attending: Cardiology | Admitting: Cardiology

## 2019-03-23 ENCOUNTER — Other Ambulatory Visit: Payer: Self-pay

## 2019-03-23 DIAGNOSIS — I10 Essential (primary) hypertension: Secondary | ICD-10-CM | POA: Diagnosis not present

## 2019-03-23 NOTE — Progress Notes (Addendum)
VAS US RENAL ARTERY       Cardell Peach 03/23/2019, 9:55 AM

## 2019-03-23 NOTE — Progress Notes (Signed)
  Echocardiogram 2D Echocardiogram has been performed.  Dana [name] 03/23/2019, 9:58 AM

## 2019-03-31 ENCOUNTER — Telehealth: Payer: Self-pay

## 2019-03-31 NOTE — Telephone Encounter (Signed)
Patient called with concerns of her blood pressure fluctuating, spoke with Dr. Tobie Poet in regards to this matter who advised that patient contact her Cardiologist. Patient is aware.

## 2019-04-01 ENCOUNTER — Telehealth: Payer: Self-pay | Admitting: Cardiology

## 2019-04-01 NOTE — Telephone Encounter (Signed)
New Message     Pt c/o medication issue:  1. Name of Medication: BYSTOLIC 20 MG TABS  2. How are you currently taking this medication (dosage and times per day)? 1 x daily   3. Are you having a reaction (difficulty breathing--STAT)? No   4. What is your medication issue? Pt is calling and states her PCP wants her to follow up with cardiology because the medication is not stabilizing her BP  03/31/19 167/63  120/59 161/84  03/30/19 153/78 110/60 188/100  Pt has experienced some Dizziness    Please call

## 2019-04-01 NOTE — Telephone Encounter (Signed)
Returned call to pt she states that her Bp has been fluctuation lately. Her BP today is 155/78 after taking her medication, she states that it fluctuates "all the time" even after taking her medication. She states that she is feeling intermittent dizziness but denies any other sx chest pain,palp or any visual disturbances. Her BP yesterday was 167/63 @ 630am before her medication and then after taking her medication it was 110/59(still dizzy). Later that day it was 120/59(dizzy) and at 4pm it was 161/98 HR was 60. Informed pt to take her BP in the future (unless she is having sx) at least an hour after medication. Then on 03-30-19 she states that it was "weird and fluctuation all that day" 153/78 @ 830am after her medications. Then in the evening @9pm  188/100 experiencing dizziness at that time denied any other sx, she states that that her dizziness is intermittent all thru-out the day even when her BP is in the normal range. She states that she usually takes her Bystolic around [id number]. Would you like to make any medication changes, please advise.

## 2019-04-01 NOTE — Telephone Encounter (Signed)
He sees Dr. Agustin Cree I will send this to him he is in the [facility] office today and in general each of [place] answers patient calls ourselves outside of an emergency is referred to the DOD

## 2019-04-02 NOTE — Telephone Encounter (Signed)
I think solution for here is for me to see her sooner rather than in 2 months.  Please schedule her to see me within next few weeks.  I will not change any medication at this stage.

## 2019-04-06 DIAGNOSIS — M48062 Spinal stenosis, lumbar region with neurogenic claudication: Secondary | ICD-10-CM | POA: Diagnosis not present

## 2019-04-06 DIAGNOSIS — R29898 Other symptoms and signs involving the musculoskeletal system: Secondary | ICD-10-CM | POA: Diagnosis not present

## 2019-04-06 NOTE — Telephone Encounter (Signed)
Pt informed of providers result & recommendations. Pt verbalized understanding. Will continue to take and log BP reading until scheduled appt tomorrow and will make sure to take BP medication before appt scheduled 04-07-19. She will arrive early, alone and wearing a mask.

## 2019-04-07 ENCOUNTER — Telehealth: Payer: Self-pay

## 2019-04-07 ENCOUNTER — Encounter: Payer: Self-pay | Admitting: Cardiology

## 2019-04-07 ENCOUNTER — Telehealth: Payer: Self-pay | Admitting: Cardiology

## 2019-04-07 ENCOUNTER — Other Ambulatory Visit: Payer: Self-pay

## 2019-04-07 ENCOUNTER — Ambulatory Visit (INDEPENDENT_AMBULATORY_CARE_PROVIDER_SITE_OTHER): Payer: Medicare Other | Admitting: Cardiology

## 2019-04-07 VITALS — BP 150/100 | HR 67 | Ht 60.0 in | Wt 140.0 lb

## 2019-04-07 DIAGNOSIS — R0609 Other forms of dyspnea: Secondary | ICD-10-CM

## 2019-04-07 DIAGNOSIS — I10 Essential (primary) hypertension: Secondary | ICD-10-CM

## 2019-04-07 DIAGNOSIS — E785 Hyperlipidemia, unspecified: Secondary | ICD-10-CM

## 2019-04-07 DIAGNOSIS — R0600 Dyspnea, unspecified: Secondary | ICD-10-CM | POA: Diagnosis not present

## 2019-04-07 DIAGNOSIS — Z79899 Other long term (current) drug therapy: Secondary | ICD-10-CM

## 2019-04-07 LAB — BASIC METABOLIC PANEL
BUN/Creatinine Ratio: 24 (ref 12–28)
BUN: 18 mg/dL (ref 8–27)
CO2: 25 mmol/L (ref 20–29)
Calcium: 9.5 mg/dL (ref 8.7–10.3)
Chloride: 102 mmol/L (ref 96–106)
Creatinine, Ser: 0.75 mg/dL (ref 0.57–1.00)
GFR calc Af Amer: 92 mL/min/{1.73_m2} (ref >59)
GFR calc non Af Amer: 80 mL/min/{1.73_m2} (ref >59)
Glucose: 84 mg/dL (ref 65–99)
Potassium: 4.8 mmol/L (ref 3.5–5.2)
Sodium: 141 mmol/L (ref 134–144)

## 2019-04-07 NOTE — Progress Notes (Signed)
Cardiology Office Note:    Date:  04/07/2019   ID:  YAIRE MCLANAHAN, DOB 06-20-1945, MRN [id number]  PCP:  Rochel Brome, MD  Cardiologist:  Jenne Campus, MD    Referring MD: Rochel Brome, MD   Chief Complaint  Patient presents with  . Follow-up    History of Present Illness:    Dana [name] is a 74 y.o. female with past medical history significant for essential hypertension, hyperlipidemia, hypothyroidism.  Recently still having more difficulty with blood pressure she was referred to [place].  She was put on Bystolic 20 mg daily which seems to be helping but not completely her blood pressure still elevated.  Overall she is doing well.  Denies have any chest pain, tightness, pressure, burning in the chest.  She did have an echocardiogram which showed only mild left ventricle hypertrophy, she did have renal ultrasounds which did not show any significant renal artery stenosis.  Past Medical History:  Diagnosis Date  . Chronic back pain 03/10/2018  . Depression   . History of ovarian cancer   . Hyperlipidemia 04/2015  . Hypothyroidism 03/10/2018  . Impingement syndrome of right shoulder 05/12/2014  . Right shoulder pain 05/12/2014    Past Surgical History:  Procedure Laterality Date  . ABDOMINAL HYSTERECTOMY  1986   Total, also had chemo  . APPENDECTOMY  1986  . LAPAROTOMY  1987   To rule out recurrent ovarian cancer  . TONSILLECTOMY      Current Medications: Current Meds  Medication Sig  . ALPRAZolam (XANAX) 0.25 MG tablet Take 0.25 mg by mouth daily as needed.  Marland Kitchen atorvastatin (LIPITOR) 10 MG tablet Take 10 mg by mouth daily.  Marland Kitchen BYSTOLIC 20 MG TABS Take 1 tablet by mouth daily.  Marland Kitchen levothyroxine (SYNTHROID) 88 MCG tablet 88 mcg daily.  . naproxen (NAPROSYN) 500 MG tablet as needed.  Marland Kitchen PREMARIN 0.625 MG tablet Take 0.625 mg by mouth daily.     Allergies:   Pravastatin and Penicillins   Social History   Socioeconomic History  . Marital status: Married    Spouse name:  Not on file  . Number of children: Not on file  . Years of education: Not on file  . Highest education level: Not on file  Occupational History  . Not on file  Tobacco Use  . Smoking status: Never Smoker  . Smokeless tobacco: Never Used  Substance and Sexual Activity  . Alcohol use: Not Currently    Comment: occasionally  . Drug use: Never  . Sexual activity: Not on file  Other Topics Concern  . Not on file  Social History Narrative  . Not on file   Social Determinants of Health   Financial Resource Strain:   . Difficulty of Paying Living Expenses: Not on file  Food Insecurity:   . Worried About Charity fundraiser in the Last Year: Not on file  . Ran Out of Food in the Last Year: Not on file  Transportation Needs:   . Lack of Transportation (Medical): Not on file  . Lack of Transportation (Non-Medical): Not on file  Physical Activity:   . Days of Exercise per Week: Not on file  . Minutes of Exercise per Session: Not on file  Stress:   . Feeling of Stress : Not on file  Social Connections:   . Frequency of Communication with Friends and Family: Not on file  . Frequency of Social Gatherings with Friends and Family: Not on file  .  Attends Religious Services: Not on file  . Active Member of Clubs or Organizations: Not on file  . Attends Archivist Meetings: Not on file  . Marital Status: Not on file     Family History: The patient's family history includes Congestive Heart Failure in her mother. ROS:   Please see the history of present illness.    All 14 point review of systems negative except as described per history of present illness  EKGs/Labs/Other Studies Reviewed:      Recent Labs: No results found for requested labs within last 8760 hours.  Recent Lipid Panel No results found for: CHOL, TRIG, HDL, CHOLHDL, VLDL, LDLCALC, LDLDIRECT  Physical Exam:    VS:  BP (!) 150/100 (BP Location: Left Arm, Patient Position: Sitting, Cuff Size: Normal)    Pulse 67   Ht 5' (1.524 m)   Wt 140 lb (63.5 kg)   SpO2 98%   BMI 27.34 kg/m     Wt Readings from Last 3 Encounters:  04/07/19 140 lb (63.5 kg)  03/19/19 138 lb (62.6 kg)     GEN:  Well nourished, well developed in no acute distress HEENT: Normal NECK: No JVD; No carotid bruits LYMPHATICS: No lymphadenopathy CARDIAC: RRR, no murmurs, no rubs, no gallops RESPIRATORY:  Clear to auscultation without rales, wheezing or rhonchi  ABDOMEN: Soft, non-tender, non-distended MUSCULOSKELETAL:  No edema; No deformity  SKIN: Warm and dry LOWER EXTREMITIES: no swelling NEUROLOGIC:  Alert and oriented x 3 PSYCHIATRIC:  Normal affect   ASSESSMENT:    1. Essential hypertension   2. Dyslipidemia   3. Dyspnea on exertion    PLAN:    In order of problems listed above:  1. Essential hypertension I will check a Chem-7 today if Chem-7 is fine then will initiate losartan 25 twice daily.  Then will follow up on Chem-7 as well.  I asked her to check blood pressure on the regular basis and bring it to me next time when she will be here. 2. Dyslipidemia I will call primary care physician to get copy of her fasting lipid profile 3. Dyspnea exertion encouraged her to exercise on a regular basis the problem is issue with her back.  This is a chronic problem that she is working on now.   Medication Adjustments/Labs and Tests Ordered: Current medicines are reviewed at length with the patient today.  Concerns regarding medicines are outlined above.  No orders of the defined types were placed in this encounter.  Medication changes: No orders of the defined types were placed in this encounter.   Signed, Park Liter, MD, [facility] 04/07/2019 9:38 AM    [place]

## 2019-04-07 NOTE — Telephone Encounter (Signed)
Spoke to DOD. Advised to tell pt to take another 1/2 bystolic and that if she becomes symptomatic to call 911. Advised pt of recommendations. Verbalized understanding. Will forward to Dr for review.

## 2019-04-07 NOTE — Telephone Encounter (Signed)
Called pt - stated that her BP was high. Last note says 197/91 - she not symptomatic at all. She was in the office this morning and she was wanting to know whether she should take another half of her bystolic. Educated that I can not advise her to do that because I do not have the order from a MD. Advised her that I will see if DOD is still in office to discuss. Advised that she needs to take her xanax that she has that. Advised to call 911 if she becomes symptomatic and explained symtoms. Verbalized understanding. Will send to DOD.

## 2019-04-07 NOTE — Telephone Encounter (Signed)
Start losartan 25 mg po qd  Chem7 in 1 week

## 2019-04-07 NOTE — Addendum Note (Signed)
Addended by: Ashok Norris on: 04/07/2019 09:53 AM   Modules accepted: Orders

## 2019-04-07 NOTE — Patient Instructions (Signed)
Medication Instructions:  Your physician recommends that you continue on your current medications as directed. Please refer to the Current Medication list given to you today.  *If you need a refill on your cardiac medications before your next appointment, please call your pharmacy*  Lab Work: Your physician recommends that you return for lab work today: bmp   If you have labs (blood work) drawn today and your tests are completely normal, you will receive your results only by: Marland Kitchen MyChart Message (if you have MyChart) OR . A paper copy in the mail If you have any lab test that is abnormal or we need to change your treatment, we will call you to review the results.  Testing/Procedures: None.   Follow-Up: At [facility], you and your health needs are our priority.  As part of our continuing mission to provide you with exceptional heart care, we have created designated Provider Care Teams.  These Care Teams include your primary Cardiologist (physician) and Advanced Practice Providers (APPs -  Physician Assistants and Nurse Practitioners) who all work together to provide you with the care you need, when you need it.  Your next appointment:   3 month(s)  The format for your next appointment:   In Person  Provider:   Jenne Campus, MD  Other Instructions

## 2019-04-07 NOTE — Telephone Encounter (Signed)
Pt c/o BP issue: STAT if pt c/o blurred vision, one-sided weakness or slurred speech  1. What are your last 5 BP readings?150/100 9:00am today, 197/91 now  2. Are you having any other symptoms (ex. Dizziness, headache, blurred vision, passed out)? no  3. What is your BP issue? Patient is concerned, because her BP has never been this high. Please advise.

## 2019-04-08 ENCOUNTER — Other Ambulatory Visit: Payer: Self-pay | Admitting: Cardiology

## 2019-04-08 ENCOUNTER — Other Ambulatory Visit: Payer: Self-pay

## 2019-04-08 MED ORDER — LOSARTAN POTASSIUM 25 MG PO TABS: 25.0000 mg | ORAL_TABLET | Freq: Every day | ORAL | 3 refills | Status: DC

## 2019-04-08 NOTE — Telephone Encounter (Signed)
Spoke with patient. Per Dr. Agustin Cree patient is to start Losartan 25mg  daily and have a Chem7 done in one week. Patient verbalized understanding.

## 2019-04-13 DIAGNOSIS — R202 Paresthesia of skin: Secondary | ICD-10-CM | POA: Diagnosis not present

## 2019-04-13 DIAGNOSIS — R29898 Other symptoms and signs involving the musculoskeletal system: Secondary | ICD-10-CM | POA: Diagnosis not present

## 2019-04-19 DIAGNOSIS — Z79899 Other long term (current) drug therapy: Secondary | ICD-10-CM | POA: Diagnosis not present

## 2019-04-19 DIAGNOSIS — I10 Essential (primary) hypertension: Secondary | ICD-10-CM | POA: Diagnosis not present

## 2019-04-19 DIAGNOSIS — Z0120 Encounter for dental examination and cleaning without abnormal findings: Secondary | ICD-10-CM | POA: Diagnosis not present

## 2019-04-19 LAB — BASIC METABOLIC PANEL
BUN/Creatinine Ratio: 19 (ref 12–28)
BUN: 14 mg/dL (ref 8–27)
CO2: 26 mmol/L (ref 20–29)
Calcium: 9.4 mg/dL (ref 8.7–10.3)
Chloride: 105 mmol/L (ref 96–106)
Creatinine, Ser: 0.73 mg/dL (ref 0.57–1.00)
GFR calc Af Amer: 95 mL/min/{1.73_m2} (ref >59)
GFR calc non Af Amer: 83 mL/min/{1.73_m2} (ref >59)
Glucose: 86 mg/dL (ref 65–99)
Potassium: 4.3 mmol/L (ref 3.5–5.2)
Sodium: 142 mmol/L (ref 134–144)

## 2019-04-21 ENCOUNTER — Ambulatory Visit (INDEPENDENT_AMBULATORY_CARE_PROVIDER_SITE_OTHER): Payer: Medicare Other | Admitting: Family Medicine

## 2019-04-21 ENCOUNTER — Other Ambulatory Visit: Payer: Self-pay

## 2019-04-21 ENCOUNTER — Encounter: Payer: Self-pay | Admitting: Family Medicine

## 2019-04-21 VITALS — BP 136/82 | HR 56 | Temp 96.3°F | Resp 16 | Ht 60.0 in | Wt 140.2 lb

## 2019-04-21 DIAGNOSIS — E034 Atrophy of thyroid (acquired): Secondary | ICD-10-CM

## 2019-04-21 DIAGNOSIS — I10 Essential (primary) hypertension: Secondary | ICD-10-CM | POA: Diagnosis not present

## 2019-04-21 DIAGNOSIS — E785 Hyperlipidemia, unspecified: Secondary | ICD-10-CM | POA: Diagnosis not present

## 2019-04-21 NOTE — Patient Instructions (Addendum)
  Essential hypertension Well-controlled.  Continue Bystolic and losartan.  Continue to eat healthy and exercise.  Check CBC.  Labs reviewed from January 2021.  Hypothyroidism Well-controlled.  Continue current medications.  Dyslipidemia Well-controlled.  Recommend low-fat diet and continued exercise.  Continue current medications.  Check lipid panel today.

## 2019-04-21 NOTE — Progress Notes (Signed)
Subjective:  Patient ID: Dana [name], female    DOB: [birth year]  Age: 74 y.o. MRN: [id number]  Chief Complaint  Patient presents with  . Hypertension  . Hypothyroidism  . Hyperlipidemia    HPI Patient is a 74 year [name] white female who presents in follow-up of hypertension, hyperlipidemia and hypothyroidism.  Her hypertension was recently diagnosed in the last 2 months.  She underwent renal arterial ultrasound ordered by Dr. Agustin Cree which were normal.  I referred her there after I had done an initial work-up on her hypertension.  It was very labile and would be low normal and into the systolic blood pressure [id number] in the same day.  Dr. Agustin Cree prescribed losartan 25 mg once daily with the Bystolic 20 mg once daily that I had started her on.  This combination seems to be working.  Patient also takes levothyroxine for her hypothyroidism which has been stable for some time.  In addition she takes Lipitor for hyperlipidemia.  The patient does eat healthy and is very active. Social Hx   Social History   Socioeconomic History  . Marital status: Married    Spouse name: Not on file  . Number of children: Not on file  . Years of education: Not on file  . Highest education level: Not on file  Occupational History  . Not on file  Tobacco Use  . Smoking status: Never Smoker  . Smokeless tobacco: Never Used  Substance and Sexual Activity  . Alcohol use: Yes    Comment: occasionally  . Drug use: Yes    Types: Solvent inhalants  . Sexual activity: Not on file  Other Topics Concern  . Not on file  Social History Narrative  . Not on file   Social Determinants of Health   Financial Resource Strain:   . Difficulty of Paying Living Expenses: Not on file  Food Insecurity:   . Worried About Charity fundraiser in the Last Year: Not on file  . Ran Out of Food in the Last Year: Not on file  Transportation Needs:   . Lack of Transportation (Medical): Not on file  . Lack of Transportation  (Non-Medical): Not on file  Physical Activity:   . Days of Exercise per Week: Not on file  . Minutes of Exercise per Session: Not on file  Stress:   . Feeling of Stress : Not on file  Social Connections:   . Frequency of Communication with Friends and Family: Not on file  . Frequency of Social Gatherings with Friends and Family: Not on file  . Attends Religious Services: Not on file  . Active Member of Clubs or Organizations: Not on file  . Attends Archivist Meetings: Not on file  . Marital Status: Not on file   Past Medical History:  Diagnosis Date  . Chronic back pain 03/10/2018  . Depression   . History of ovarian cancer   . Hyperlipidemia 04/2015  . Hypothyroidism 03/10/2018  . Impingement syndrome of right shoulder 05/12/2014  . Right shoulder pain 05/12/2014   Family History  Problem Relation Age of Onset  . Congestive Heart Failure Mother   . Osteoarthritis Mother     Review of Systems  Constitutional: Negative for chills, fatigue and fever.  HENT: Negative for congestion, ear pain and sore throat.   Respiratory: Negative for cough and shortness of breath.   Cardiovascular: Negative for chest pain.  Gastrointestinal: Negative for abdominal pain, constipation, diarrhea, nausea and vomiting.  Endocrine: Negative  for polydipsia, polyphagia and polyuria.  Genitourinary: Negative for dysuria and urgency.  Musculoskeletal: Negative for arthralgias and myalgias.  Neurological: Negative for dizziness and headaches.  Psychiatric/Behavioral: Negative for dysphoric mood. The patient is not nervous/anxious.      Objective:  BP 136/82   Pulse (!) 56   Temp (!) 96.3 F (35.7 C)   Resp 16   Ht 5' (1.524 m)   Wt 140 lb 3.2 oz (63.6 kg)   BMI 27.38 kg/m   BP/Weight 04/21/2019 04/07/2019 0000000  Systolic BP XX123456 Q000111Q Q000111Q  Diastolic BP 82 123XX123 90  Wt. (Lbs) 140.2 140 138  BMI 27.38 27.34 22.96    Physical Exam Vitals reviewed.  Constitutional:      General:  She is not in acute distress.    Appearance: Normal appearance. She is normal weight.  Eyes:     Conjunctiva/sclera: Conjunctivae normal.  Neck:     Thyroid: No thyroid mass.     Vascular: No carotid bruit.  Cardiovascular:     Rate and Rhythm: Normal rate and regular rhythm.     Pulses: Normal pulses.     Heart sounds: Normal heart sounds. No murmur.  Pulmonary:     Effort: Pulmonary effort is normal.     Breath sounds: Normal breath sounds.  Abdominal:     General: Bowel sounds are normal.     Palpations: Abdomen is soft. There is no mass.     Tenderness: There is no abdominal tenderness.  Skin:    General: Skin is warm and dry.  Neurological:     Mental Status: She is alert and oriented to person, place, and time.     Cranial Nerves: No cranial nerve deficit.  Psychiatric:        Mood and Affect: Mood normal.        Behavior: Behavior normal.      Lab Results  Component Value Date   WBC 6.2 04/21/2019   HGB 13.9 04/21/2019   HCT 41.1 04/21/2019   PLT 326 04/21/2019   GLUCOSE 86 04/19/2019   CHOL 192 04/21/2019   TRIG 86 04/21/2019   HDL 83 04/21/2019   LDLCALC 94 04/21/2019   NA 142 04/19/2019   K 4.3 04/19/2019   CL 105 04/19/2019   CREATININE 0.73 04/19/2019   BUN 14 04/19/2019   CO2 26 04/19/2019      Assessment & Plan:   Problem List Items Addressed This Visit      Cardiovascular and Mediastinum   Essential hypertension - Primary   Relevant Orders   CBC with Differential/Platelet (Completed)     Other   Dyslipidemia   Relevant Orders   Lipid panel (Completed)    Essential hypertension Well-controlled.  Continue Bystolic and losartan.  Continue to eat healthy and exercise.  Check CBC.  Labs reviewed from January 2021.  Hypothyroidism Well-controlled.  Continue current medications.  Dyslipidemia Well-controlled.  Recommend low-fat diet and continued exercise.  Continue current medications.  Check lipid panel today.  Follow-up: Return in  about 3 months (around 07/19/2019).  A CLINICAL SUMMARY including a written plan identify barriers to care unique to individual due to social or financial issues and help create solutions together. and a patient's and the patient's families understanding of their medical issues and care needs   [place] [phone]

## 2019-04-22 LAB — CBC WITH DIFFERENTIAL/PLATELET
Basophils Absolute: 0 10*3/uL (ref 0.0–0.2)
Basos: 1 %
EOS (ABSOLUTE): 0.1 10*3/uL (ref 0.0–0.4)
Eos: 1 %
Hematocrit: 41.1 % (ref 34.0–46.6)
Hemoglobin: 13.9 g/dL (ref 11.1–15.9)
Immature Grans (Abs): 0 10*3/uL (ref 0.0–0.1)
Immature Granulocytes: 0 %
Lymphocytes Absolute: 1.7 10*3/uL (ref 0.7–3.1)
Lymphs: 28 %
MCH: 31.7 pg (ref 26.6–33.0)
MCHC: 33.8 g/dL (ref 31.5–35.7)
MCV: 94 fL (ref 79–97)
Monocytes Absolute: 0.5 10*3/uL (ref 0.1–0.9)
Monocytes: 7 %
Neutrophils Absolute: 3.9 10*3/uL (ref 1.4–7.0)
Neutrophils: 63 %
Platelets: 326 10*3/uL (ref 150–450)
RBC: 4.39 x10E6/uL (ref 3.77–5.28)
RDW: 12.5 % (ref 11.7–15.4)
WBC: 6.2 10*3/uL (ref 3.4–10.8)

## 2019-04-22 LAB — LIPID PANEL
Chol/HDL Ratio: 2.3 ratio (ref 0.0–4.4)
Cholesterol, Total: 192 mg/dL (ref 100–199)
HDL: 83 mg/dL (ref >39)
LDL Chol Calc (NIH): 94 mg/dL (ref 0–99)
Triglycerides: 86 mg/dL (ref 0–149)
VLDL Cholesterol Cal: 15 mg/dL (ref 5–40)

## 2019-04-22 LAB — CARDIOVASCULAR RISK ASSESSMENT

## 2019-04-23 ENCOUNTER — Other Ambulatory Visit: Payer: Self-pay | Admitting: Family Medicine

## 2019-04-26 ENCOUNTER — Encounter: Payer: Self-pay | Admitting: Family Medicine

## 2019-04-26 NOTE — Assessment & Plan Note (Signed)
Well-controlled.  Recommend low-fat diet and continued exercise.  Continue current medications.  Check lipid panel today.

## 2019-04-26 NOTE — Assessment & Plan Note (Signed)
Well controlled. Continue current medications  

## 2019-04-26 NOTE — Assessment & Plan Note (Signed)
Well-controlled.  Continue Bystolic and losartan.  Continue to eat healthy and exercise.  Check CBC.  Labs reviewed from January 2021.

## 2019-04-27 ENCOUNTER — Ambulatory Visit: Payer: Medicare Other | Admitting: Cardiology

## 2019-04-28 DIAGNOSIS — M48062 Spinal stenosis, lumbar region with neurogenic claudication: Secondary | ICD-10-CM | POA: Diagnosis not present

## 2019-06-29 ENCOUNTER — Other Ambulatory Visit: Payer: Self-pay | Admitting: Family Medicine

## 2019-07-02 DIAGNOSIS — L821 Other seborrheic keratosis: Secondary | ICD-10-CM | POA: Diagnosis not present

## 2019-07-02 DIAGNOSIS — L570 Actinic keratosis: Secondary | ICD-10-CM | POA: Diagnosis not present

## 2019-07-02 DIAGNOSIS — L565 Disseminated superficial actinic porokeratosis (DSAP): Secondary | ICD-10-CM | POA: Diagnosis not present

## 2019-07-13 DIAGNOSIS — M48062 Spinal stenosis, lumbar region with neurogenic claudication: Secondary | ICD-10-CM | POA: Diagnosis not present

## 2019-07-16 NOTE — Progress Notes (Signed)
Subjective:  Patient ID: Dana [name], female    DOB: [birth year]  Age: 74 y.o. MRN: [id number]  Chief Complaint  Patient presents with  . Hyperlipidemia  . Hypertension  . Hypothyroidism    HPI Patient is a 74 year [name] white female who presents in follow-up of hypertension, hyperlipidemia and hypothyroidism.   Her hypertension was recently diagnosed in the last 3 months.  Renal arterial ultrasound was normal in February 2021. Following with by Dr. Agustin Cree. Dr. Agustin Cree prescribed losartan 25 mg once daily with the Bystolic 20 mg once daily that has helped her bp.  Patient also takes levothyroxine for her hypothyroidism which has been stable for some time.   In addition she takes Lipitor for hyperlipidemia.  The patient does eat healthy and is very active.  Patient is having lumbar pain and has seen Dr. Brien Few and has had two ESI. She tried naproxen, but felt like her bp increases. NCV EMG was done. Helped some.  Social History   Socioeconomic History  . Marital status: Married    Spouse name: Not on file  . Number of children: Not on file  . Years of education: Not on file  . Highest education level: Not on file  Occupational History  . Not on file  Tobacco Use  . Smoking status: Never Smoker  . Smokeless tobacco: Never Used  Substance and Sexual Activity  . Alcohol use: Yes    Comment: occasionally  . Drug use: Yes    Types: Solvent inhalants  . Sexual activity: Not on file  Other Topics Concern  . Not on file  Social History Narrative  . Not on file   Social Determinants of Health   Financial Resource Strain:   . Difficulty of Paying Living Expenses:   Food Insecurity:   . Worried About Charity fundraiser in the Last Year:   . Arboriculturist in the Last Year:   Transportation Needs:   . Film/video editor (Medical):   Marland Kitchen Lack of Transportation (Non-Medical):   Physical Activity:   . Days of Exercise per Week:   . Minutes of Exercise per Session:     Stress:   . Feeling of Stress :   Social Connections:   . Frequency of Communication with Friends and Family:   . Frequency of Social Gatherings with Friends and Family:   . Attends Religious Services:   . Active Member of Clubs or Organizations:   . Attends Archivist Meetings:   Marland Kitchen Marital Status:    Past Medical History:  Diagnosis Date  . Anxiety disorder 07/19/2019  . Chronic back pain 03/10/2018  . Depression   . History of ovarian cancer   . Hyperlipidemia 04/2015  . Hypertension 02/2019  . Hypothyroidism 03/10/2018  . Impingement syndrome of right shoulder 05/12/2014  . Insomnia due to other mental disorder 07/19/2019  . Right shoulder pain 05/12/2014  . Sciatic nerve pain, left 07/19/2019  . Sciatic nerve pain, right 07/19/2019   Family History  Problem Relation Age of Onset  . Congestive Heart Failure Mother   . Osteoarthritis Mother     Review of Systems  Constitutional: Negative for chills, fatigue and fever.  HENT: Negative for congestion, ear pain and sore throat.   Respiratory: Negative for cough and shortness of breath.   Cardiovascular: Negative for chest pain.  Gastrointestinal: Negative for abdominal pain, constipation, diarrhea, nausea and vomiting.  Endocrine: Negative for polydipsia, polyphagia and polyuria.  Genitourinary: Negative for  dysuria and urgency.  Musculoskeletal: Negative for arthralgias and myalgias.  Neurological: Negative for dizziness and headaches.  Psychiatric/Behavioral: Negative for dysphoric mood. The patient is not nervous/anxious.    Objective:  BP 116/68   Pulse 96   Temp (!) 96.3 F (35.7 C)   Resp 16   Ht 5' (1.524 m)   Wt 137 lb (62.1 kg)   BMI 26.76 kg/m   BP/Weight 07/20/2019 04/21/2019 [id number]  Systolic BP 99991111 [id number] Q000111Q  Diastolic BP 68 82 123XX123  Wt. (Lbs) 137 140.2 140  BMI 26.76 27.38 27.34    Physical Exam Vitals reviewed.  Constitutional:      General: She is not in acute distress.    Appearance:  Normal appearance. She is normal weight.  Cardiovascular:     Rate and Rhythm: Normal rate and regular rhythm.     Pulses: Normal pulses.     Heart sounds: Normal heart sounds. No murmur.  Pulmonary:     Effort: Pulmonary effort is normal.     Breath sounds: Normal breath sounds.  Abdominal:     General: Bowel sounds are normal.     Palpations: Abdomen is soft. There is no mass.     Tenderness: There is no abdominal tenderness.  Skin:    General: Skin is warm and dry.  Neurological:     Mental Status: She is alert and oriented to person, place, and time.     Cranial Nerves: No cranial nerve deficit.  Psychiatric:        Mood and Affect: Mood normal.        Behavior: Behavior normal.      Lab Results  Component Value Date   WBC 6.2 04/21/2019   HGB 13.9 04/21/2019   HCT 41.1 04/21/2019   PLT 326 04/21/2019   GLUCOSE 86 04/19/2019   CHOL 192 04/21/2019   TRIG 86 04/21/2019   HDL 83 04/21/2019   LDLCALC 94 04/21/2019   NA 142 04/19/2019   K 4.3 04/19/2019   CL 105 04/19/2019   CREATININE 0.73 04/19/2019   BUN 14 04/19/2019   CO2 26 04/19/2019      Assessment & Plan:  1. Essential hypertension The current medical regimen is effective;  continue present plan and medications. - Comprehensive metabolic panel - CBC with Differential/Platelet  2. Hypothyroidism due to acquired atrophy of thyroid The current medical regimen is effective;  continue present plan and medications. - TSH  3. Dyslipidemia Recommend continue to work on eating healthy diet and exercise. - Lipid panel  4. Lumbar back pain Follow up with Dr. Brien Few. Request records.   Follow-up: Return in about 3 months (around 10/20/2019).  A CLINICAL SUMMARY including a written plan identify barriers to care unique to individual due to social or financial issues and help create solutions together. and a patient's and the patient's families understanding of their medical issues and care needs

## 2019-07-19 ENCOUNTER — Encounter: Payer: Self-pay | Admitting: Family Medicine

## 2019-07-19 DIAGNOSIS — M5432 Sciatica, left side: Secondary | ICD-10-CM

## 2019-07-19 DIAGNOSIS — M5431 Sciatica, right side: Secondary | ICD-10-CM

## 2019-07-19 DIAGNOSIS — F5105 Insomnia due to other mental disorder: Secondary | ICD-10-CM

## 2019-07-19 DIAGNOSIS — F419 Anxiety disorder, unspecified: Secondary | ICD-10-CM

## 2019-07-19 HISTORY — DX: Sciatica, right side: M54.31

## 2019-07-19 HISTORY — DX: Insomnia due to other mental disorder: F51.05

## 2019-07-19 HISTORY — DX: Anxiety disorder, unspecified: F41.9

## 2019-07-19 HISTORY — DX: Sciatica, left side: M54.32

## 2019-07-20 ENCOUNTER — Encounter: Payer: Self-pay | Admitting: Family Medicine

## 2019-07-20 ENCOUNTER — Other Ambulatory Visit: Payer: Self-pay | Admitting: Family Medicine

## 2019-07-20 ENCOUNTER — Other Ambulatory Visit: Payer: Self-pay

## 2019-07-20 ENCOUNTER — Ambulatory Visit (INDEPENDENT_AMBULATORY_CARE_PROVIDER_SITE_OTHER): Payer: Medicare Other | Admitting: Family Medicine

## 2019-07-20 VITALS — BP 116/68 | HR 96 | Temp 96.3°F | Resp 16 | Ht 60.0 in | Wt 137.0 lb

## 2019-07-20 DIAGNOSIS — M545 Low back pain, unspecified: Secondary | ICD-10-CM

## 2019-07-20 DIAGNOSIS — I10 Essential (primary) hypertension: Secondary | ICD-10-CM

## 2019-07-20 DIAGNOSIS — E034 Atrophy of thyroid (acquired): Secondary | ICD-10-CM

## 2019-07-20 DIAGNOSIS — E785 Hyperlipidemia, unspecified: Secondary | ICD-10-CM | POA: Diagnosis not present

## 2019-07-20 DIAGNOSIS — R7301 Impaired fasting glucose: Secondary | ICD-10-CM | POA: Diagnosis not present

## 2019-07-20 HISTORY — DX: Low back pain, unspecified: M54.50

## 2019-07-20 MED ORDER — MELOXICAM 7.5 MG PO TABS
7.5000 mg | ORAL_TABLET | Freq: Two times a day (BID) | ORAL | 1 refills | Status: DC | PRN
Start: 2019-07-20 — End: 2023-08-04

## 2019-07-20 NOTE — Patient Instructions (Signed)
Trial on meloxicam for back. rx sent No other changes.

## 2019-07-21 LAB — CBC WITH DIFFERENTIAL/PLATELET
Basophils Absolute: 0.1 10*3/uL (ref 0.0–0.2)
Basos: 1 %
EOS (ABSOLUTE): 0.1 10*3/uL (ref 0.0–0.4)
Eos: 1 %
Hematocrit: 42.6 % (ref 34.0–46.6)
Hemoglobin: 14.3 g/dL (ref 11.1–15.9)
Immature Grans (Abs): 0 10*3/uL (ref 0.0–0.1)
Immature Granulocytes: 0 %
Lymphocytes Absolute: 2.1 10*3/uL (ref 0.7–3.1)
Lymphs: 25 %
MCH: 31.2 pg (ref 26.6–33.0)
MCHC: 33.6 g/dL (ref 31.5–35.7)
MCV: 93 fL (ref 79–97)
Monocytes Absolute: 0.6 10*3/uL (ref 0.1–0.9)
Monocytes: 8 %
Neutrophils Absolute: 5.4 10*3/uL (ref 1.4–7.0)
Neutrophils: 65 %
Platelets: 359 10*3/uL (ref 150–450)
RBC: 4.59 x10E6/uL (ref 3.77–5.28)
RDW: 12.4 % (ref 11.7–15.4)
WBC: 8.3 10*3/uL (ref 3.4–10.8)

## 2019-07-21 LAB — COMPREHENSIVE METABOLIC PANEL
ALT: 9 IU/L (ref 0–32)
AST: 14 IU/L (ref 0–40)
Albumin/Globulin Ratio: 2.3 — ABNORMAL HIGH (ref 1.2–2.2)
Albumin: 4.3 g/dL (ref 3.7–4.7)
Alkaline Phosphatase: 54 IU/L (ref 48–121)
BUN/Creatinine Ratio: 18 (ref 12–28)
BUN: 15 mg/dL (ref 8–27)
Bilirubin Total: 0.5 mg/dL (ref 0.0–1.2)
CO2: 24 mmol/L (ref 20–29)
Calcium: 10 mg/dL (ref 8.7–10.3)
Chloride: 101 mmol/L (ref 96–106)
Creatinine, Ser: 0.84 mg/dL (ref 0.57–1.00)
GFR calc Af Amer: 80 mL/min/{1.73_m2} (ref >59)
GFR calc non Af Amer: 70 mL/min/{1.73_m2} (ref >59)
Globulin, Total: 1.9 g/dL (ref 1.5–4.5)
Glucose: 109 mg/dL — ABNORMAL HIGH (ref 65–99)
Potassium: 4.5 mmol/L (ref 3.5–5.2)
Sodium: 137 mmol/L (ref 134–144)
Total Protein: 6.2 g/dL (ref 6.0–8.5)

## 2019-07-21 LAB — TSH: TSH: 3.22 u[IU]/mL (ref 0.450–4.500)

## 2019-07-21 LAB — LIPID PANEL
Chol/HDL Ratio: 2.3 ratio (ref 0.0–4.4)
Cholesterol, Total: 215 mg/dL — ABNORMAL HIGH (ref 100–199)
HDL: 94 mg/dL (ref >39)
LDL Chol Calc (NIH): 105 mg/dL — ABNORMAL HIGH (ref 0–99)
Triglycerides: 91 mg/dL (ref 0–149)
VLDL Cholesterol Cal: 16 mg/dL (ref 5–40)

## 2019-07-21 LAB — CARDIOVASCULAR RISK ASSESSMENT

## 2019-07-23 LAB — SPECIMEN STATUS REPORT

## 2019-07-23 LAB — HGB A1C W/O EAG: Hgb A1c MFr Bld: 5.7 % — ABNORMAL HIGH (ref 4.8–5.6)

## 2019-07-28 ENCOUNTER — Encounter: Payer: Self-pay | Admitting: Cardiology

## 2019-07-28 ENCOUNTER — Ambulatory Visit: Payer: Medicare Other | Admitting: Cardiology

## 2019-07-28 ENCOUNTER — Other Ambulatory Visit: Payer: Self-pay

## 2019-07-28 VITALS — BP 118/78 | HR 70 | Ht 63.0 in | Wt 138.0 lb

## 2019-07-28 DIAGNOSIS — R0609 Other forms of dyspnea: Secondary | ICD-10-CM

## 2019-07-28 DIAGNOSIS — I10 Essential (primary) hypertension: Secondary | ICD-10-CM

## 2019-07-28 DIAGNOSIS — R0600 Dyspnea, unspecified: Secondary | ICD-10-CM

## 2019-07-28 DIAGNOSIS — E785 Hyperlipidemia, unspecified: Secondary | ICD-10-CM | POA: Insufficient documentation

## 2019-07-28 HISTORY — DX: Hyperlipidemia, unspecified: E78.5

## 2019-07-28 NOTE — Patient Instructions (Signed)

## 2019-07-28 NOTE — Progress Notes (Signed)
Cardiology Office Note:    Date:  07/28/2019   ID:  Dana [name], DOB [birth year], MRN [id number]  PCP:  Dana Brome, MD  Cardiologist:  Dana Campus, MD    Referring MD: Dana Brome, MD   No chief complaint on file. Doing well  History of Present Illness:    Dana [name] is a 74 y.o. female with past medical history significant for essential hypertension, hypothyroidism, dyslipidemia.  She was referred to me because there was a problem controlling her blood pressure.  However blood pressure appears to be now well controlled.  She is doing fine.  Denies have any chest pain tightness squeezing pressure burning chest.  She does have some chronic problem with the back with slow her down in terms of exercises but overall seems to be doing well.  Past Medical History:  Diagnosis Date  . Anxiety disorder 07/19/2019  . Chronic back pain 03/10/2018  . Depression   . History of ovarian cancer   . Hyperlipidemia 04/2015  . Hypertension 02/2019  . Hypothyroidism 03/10/2018  . Impingement syndrome of right shoulder 05/12/2014  . Insomnia due to other mental disorder 07/19/2019  . Right shoulder pain 05/12/2014  . Sciatic nerve pain, left 07/19/2019  . Sciatic nerve pain, right 07/19/2019    Past Surgical History:  Procedure Laterality Date  . ABDOMINAL HYSTERECTOMY  1986   Total, also had chemo  . APPENDECTOMY  1986  . LAPAROTOMY  1987   To rule out recurrent ovarian cancer  . TONSILLECTOMY      Current Medications: Current Meds  Medication Sig  . ALPRAZolam (XANAX) 0.25 MG tablet Take 0.25 mg by mouth daily as needed.  Marland Kitchen atorvastatin (LIPITOR) 10 MG tablet TAKE 1 TABLET ONCE DAILY  . BYSTOLIC 20 MG TABS TAKE 1 TABLET BY MOUTH ONCE DAILY  . levothyroxine (SYNTHROID) 88 MCG tablet TAKE 1 TABLET ONCE DAILY  . meloxicam (MOBIC) 7.5 MG tablet Take 1 tablet (7.5 mg total) by mouth 2 (two) times daily as needed for pain.  Marland Kitchen PREMARIN 0.625 MG tablet Take 0.625 mg by mouth daily.     Allergies:   Penicillins and Pravastatin   Social History   Socioeconomic History  . Marital status: Married    Spouse name: Not on file  . Number of children: Not on file  . Years of education: Not on file  . Highest education level: Not on file  Occupational History  . Not on file  Tobacco Use  . Smoking status: Never Smoker  . Smokeless tobacco: Never Used  Substance and Sexual Activity  . Alcohol use: Yes    Comment: occasionally  . Drug use: Yes    Types: Solvent inhalants  . Sexual activity: Not on file  Other Topics Concern  . Not on file  Social History Narrative  . Not on file   Social Determinants of Health   Financial Resource Strain:   . Difficulty of Paying Living Expenses:   Food Insecurity:   . Worried About Charity fundraiser in the Last Year:   . Arboriculturist in the Last Year:   Transportation Needs:   . Film/video editor (Medical):   Marland Kitchen Lack of Transportation (Non-Medical):   Physical Activity:   . Days of Exercise per Week:   . Minutes of Exercise per Session:   Stress:   . Feeling of Stress :   Social Connections:   . Frequency of Communication with Friends and Family:   .  Frequency of Social Gatherings with Friends and Family:   . Attends Religious Services:   . Active Member of Clubs or Organizations:   . Attends Archivist Meetings:   Marland Kitchen Marital Status:      Family History: The patient's family history includes Congestive Heart Failure in her mother; Osteoarthritis in her mother. ROS:   Please see the history of present illness.    All 14 point review of systems negative except as described per history of present illness  EKGs/Labs/Other Studies Reviewed:      Recent Labs: 07/20/2019: ALT 9; BUN 15; Creatinine, Ser 0.84; Hemoglobin 14.3; Platelets 359; Potassium 4.5; Sodium 137; TSH 3.220  Recent Lipid Panel    Component Value Date/Time   CHOL 215 (H) 07/20/2019 0846   TRIG 91 07/20/2019 0846   HDL 94  07/20/2019 0846   CHOLHDL 2.3 07/20/2019 0846   LDLCALC 105 (H) 07/20/2019 0846    Physical Exam:    VS:  BP 118/78   Pulse 70   Ht 5\' 3"  (1.6 m)   Wt 138 lb (62.6 kg)   SpO2 96%   BMI 24.45 kg/m     Wt Readings from Last 3 Encounters:  07/28/19 138 lb (62.6 kg)  07/20/19 137 lb (62.1 kg)  04/21/19 140 lb 3.2 oz (63.6 kg)     GEN:  Well nourished, well developed in no acute distress HEENT: Normal NECK: No JVD; No carotid bruits LYMPHATICS: No lymphadenopathy CARDIAC: RRR, no murmurs, no rubs, no gallops RESPIRATORY:  Clear to auscultation without rales, wheezing or rhonchi  ABDOMEN: Soft, non-tender, non-distended MUSCULOSKELETAL:  No edema; No deformity  SKIN: Warm and dry LOWER EXTREMITIES: no swelling NEUROLOGIC:  Alert and oriented x 3 PSYCHIATRIC:  Normal affect   ASSESSMENT:    1. Essential hypertension   2. Dyspnea on exertion   3. Dyslipidemia    PLAN:    In order of problems listed above:  1. Essential hypertension well-controlled, continue present management.  She tells me that sometimes when she check her blood pressure first thing in the morning she will see numbers of 140/90.  I told her not to check blood pressure first thing in the morning.  She should check it maybe once a day every other day also at different times of the day different day.  She said she will do it we did talk in length about nonpharmacological ways to reduce her blood pressure which include avoiding salty food exercises on the regular basis and proper weight management.  She understand and she will try to do that.  She describes episode of her blood pressure being high but that was related to chronic back pain also also use of nonsteroidal anti-inflammatory medications. 2. Dyspnea on exertion denies having any.  Echocardiogram reviewed showed mild left ventricle hypertrophy but overall left ventricle ejection fraction was normal. 3. Dyslipidemia: She is on Lipitor 10 which I will  continue.  I did review laboratory test done by her primary care physician which showing LDL of 101 and HDL 94.  Previously I calculated her 10-year risk which came as intermediate, therefore, she need to be on statin and she is already on it.  For her clinical scenario intermediate intensity statin will be appropriate and Lipitor time should meet those criteria.  Also reviewed her hemoglobin A1c which was minimally elevated 5.7 with upper limits of normal 5.6.  We did talk already about need to exercise.  Her TSH was normal.   Medication Adjustments/Labs  and Tests Ordered: Current medicines are reviewed at length with the patient today.  Concerns regarding medicines are outlined above.  No orders of the defined types were placed in this encounter.  Medication changes: No orders of the defined types were placed in this encounter.   Signed, Park Liter, MD, [facility] [place] 07/28/2019 8:38 AM    [place]

## 2019-07-29 ENCOUNTER — Ambulatory Visit (INDEPENDENT_AMBULATORY_CARE_PROVIDER_SITE_OTHER): Payer: Medicare Other

## 2019-07-29 VITALS — BP 124/78 | HR 62 | Temp 98.4°F | Resp 16 | Ht 60.0 in | Wt 134.0 lb

## 2019-07-29 DIAGNOSIS — Z6826 Body mass index (BMI) 26.0-26.9, adult: Secondary | ICD-10-CM | POA: Diagnosis not present

## 2019-07-29 DIAGNOSIS — Z0000 Encounter for general adult medical examination without abnormal findings: Secondary | ICD-10-CM | POA: Diagnosis not present

## 2019-07-29 DIAGNOSIS — Z8543 Personal history of malignant neoplasm of ovary: Secondary | ICD-10-CM

## 2019-07-29 NOTE — Patient Instructions (Signed)
 Fall Prevention in the Home, Adult Falls can cause injuries. They can happen to people of all ages. There are many things you can do to make your home safe and to help prevent falls. Ask for help when making these changes, if needed. What actions can I take to prevent falls? General Instructions  Use good lighting in all rooms. Replace any light bulbs that burn out.  Turn on the lights when you go into a dark area. Use night-lights.  Keep items that you use often in easy-to-reach places. Lower the shelves around your home if necessary.  Set up your furniture so you have a clear path. Avoid moving your furniture around.  Do not have throw rugs and other things on the floor that can make you trip.  Avoid walking on wet floors.  If any of your floors are uneven, fix them.  Add color or contrast paint or tape to clearly mark and help you see: ? Any grab bars or handrails. ? First and last steps of stairways. ? Where the edge of each step is.  If you use a stepladder: ? Make sure that it is fully opened. Do not climb a closed stepladder. ? Make sure that both sides of the stepladder are locked into place. ? Ask someone to hold the stepladder for you while you use it.  If there are any pets around you, be aware of where they are. What can I do in the bathroom?      Keep the floor dry. Clean up any water that spills onto the floor as soon as it happens.  Remove soap buildup in the tub or shower regularly.  Use non-skid mats or decals on the floor of the tub or shower.  Attach bath mats securely with double-sided, non-slip rug tape.  If you need to sit down in the shower, use a plastic, non-slip stool.  Install grab bars by the toilet and in the tub and shower. Do not use towel bars as grab bars. What can I do in the bedroom?  Make sure that you have a light by your bed that is easy to reach.  Do not use any sheets or blankets that are too big for your bed. They should  not hang down onto the floor.  Have a firm chair that has side arms. You can use this for support while you get dressed. What can I do in the kitchen?  Clean up any spills right away.  If you need to reach something above you, use a strong step stool that has a grab bar.  Keep electrical cords out of the way.  Do not use floor polish or wax that makes floors slippery. If you must use wax, use non-skid floor wax. What can I do with my stairs?  Do not leave any items on the stairs.  Make sure that you have a light switch at the top of the stairs and the bottom of the stairs. If you do not have them, ask someone to add them for you.  Make sure that there are handrails on both sides of the stairs, and use them. Fix handrails that are broken or loose. Make sure that handrails are as long as the stairways.  Install non-slip stair treads on all stairs in your home.  Avoid having throw rugs at the top or bottom of the stairs. If you do have throw rugs, attach them to the floor with carpet tape.  Choose a carpet that   does not hide the edge of the steps on the stairway.  Check any carpeting to make sure that it is firmly attached to the stairs. Fix any carpet that is loose or worn. What can I do on the outside of my home?  Use bright outdoor lighting.  Regularly fix the edges of walkways and driveways and fix any cracks.  Remove anything that might make you trip as you walk through a door, such as a raised step or threshold.  Trim any bushes or trees on the path to your home.  Regularly check to see if handrails are loose or broken. Make sure that both sides of any steps have handrails.  Install guardrails along the edges of any raised decks and porches.  Clear walking paths of anything that might make someone trip, such as tools or rocks.  Have any leaves, snow, or ice cleared regularly.  Use sand or salt on walking paths during winter.  Clean up any spills in your garage right  away. This includes grease or oil spills. What other actions can I take?  Wear shoes that: ? Have a low heel. Do not wear high heels. ? Have rubber bottoms. ? Are comfortable and fit you well. ? Are closed at the toe. Do not wear open-toe sandals.  Use tools that help you move around (mobility aids) if they are needed. These include: ? Canes. ? Walkers. ? Scooters. ? Crutches.  Review your medicines with your doctor. Some medicines can make you feel dizzy. This can increase your chance of falling. Ask your doctor what other things you can do to help prevent falls. Where to find more information  Centers for Disease Control and Prevention, [employer]AD[employer]: https://cdc.gov  National Institute on Aging: https://go4life.nia.nih.gov Contact a doctor if:  You are afraid of falling at home.  You feel weak, drowsy, or dizzy at home.  You fall at home. Summary  There are many simple things that you can do to make your home safe and to help prevent falls.  Ways to make your home safe include removing tripping hazards and installing grab bars in the bathroom.  Ask for help when making these changes in your home. This information is not intended to replace advice given to you by your health care provider. Make sure you discuss any questions you have with your health care provider. Document Revised: 06/04/2018 Document Reviewed: 09/26/2016 Elsevier Patient Education  2020 Elsevier Inc.   Health Maintenance, Female Adopting a healthy lifestyle and getting preventive care are important in promoting health and wellness. Ask your health care provider about:  The right schedule for you to have regular tests and exams.  Things you can do on your own to prevent diseases and keep yourself healthy. What should I know about diet, weight, and exercise? Eat a healthy diet   Eat a diet that includes plenty of vegetables, fruits, low-fat dairy products, and lean protein.  Do not eat a lot of foods  that are high in solid fats, added sugars, or sodium. Maintain a healthy weight Body mass index (BMI) is used to identify weight problems. It estimates body fat based on height and weight. Your health care provider can help determine your BMI and help you achieve or maintain a healthy weight. Get regular exercise Get regular exercise. This is one of the most important things you can do for your health. Most adults should:  Exercise for at least 150 minutes each week. The exercise should increase your heart rate   and make you sweat (moderate-intensity exercise).  Do strengthening exercises at least twice a week. This is in addition to the moderate-intensity exercise.  Spend less time sitting. Even light physical activity can be beneficial. Watch cholesterol and blood lipids Have your blood tested for lipids and cholesterol at 73 years of age, then have this test every 5 years. Have your cholesterol levels checked more often if:  Your lipid or cholesterol levels are high.  You are older than 73 years of age.  You are at high risk for heart disease. What should I know about cancer screening? Depending on your health history and family history, you may need to have cancer screening at various ages. This may include screening for:  Breast cancer.  Cervical cancer.  Colorectal cancer.  Skin cancer.  Lung cancer. What should I know about heart disease, diabetes, and high blood pressure? Blood pressure and heart disease  High blood pressure causes heart disease and increases the risk of stroke. This is more likely to develop in people who have high blood pressure readings, are of African descent, or are overweight.  Have your blood pressure checked: ? Every 3-5 years if you are 18-39 years of age. ? Every year if you are 40 years old or older. Diabetes Have regular diabetes screenings. This checks your fasting blood sugar level. Have the screening done:  Once every three years after  age 40 if you are at a normal weight and have a low risk for diabetes.  More often and at a younger age if you are overweight or have a high risk for diabetes. What should I know about preventing infection? Hepatitis B If you have a higher risk for hepatitis B, you should be screened for this virus. Talk with your health care provider to find out if you are at risk for hepatitis B infection. Hepatitis C Testing is recommended for:  Everyone born from 1945 through 1965.  Anyone with known risk factors for hepatitis C. Sexually transmitted infections (STIs)  Get screened for STIs, including gonorrhea and chlamydia, if: ? You are sexually active and are younger than 73 years of age. ? You are older than 73 years of age and your health care provider tells you that you are at risk for this type of infection. ? Your sexual activity has changed since you were last screened, and you are at increased risk for chlamydia or gonorrhea. Ask your health care provider if you are at risk.  Ask your health care provider about whether you are at high risk for HIV. Your health care provider may recommend a prescription medicine to help prevent HIV infection. If you choose to take medicine to prevent HIV, you should first get tested for HIV. You should then be tested every 3 months for as long as you are taking the medicine. Pregnancy  If you are about to stop having your period (premenopausal) and you may become pregnant, seek counseling before you get pregnant.  Take 400 to 800 micrograms (mcg) of folic acid every day if you become pregnant.  Ask for birth control (contraception) if you want to prevent pregnancy. Osteoporosis and menopause Osteoporosis is a disease in which the bones lose minerals and strength with aging. This can result in bone fractures. If you are 65 years old or older, or if you are at risk for osteoporosis and fractures, ask your health care provider if you should:  Be screened for  bone loss.  Take a calcium or vitamin   D supplement to lower your risk of fractures.  Be given hormone replacement therapy (HRT) to treat symptoms of menopause. Follow these instructions at home: Lifestyle  Do not use any products that contain nicotine or tobacco, such as cigarettes, e-cigarettes, and chewing tobacco. If you need help quitting, ask your health care provider.  Do not use street drugs.  Do not share needles.  Ask your health care provider for help if you need support or information about quitting drugs. Alcohol use  Do not drink alcohol if: ? Your health care provider tells you not to drink. ? You are pregnant, may be pregnant, or are planning to become pregnant.  If you drink alcohol: ? Limit how much you use to 0-1 drink a day. ? Limit intake if you are breastfeeding.  Be aware of how much alcohol is in your drink. In the [place] one drink equals one 12 oz bottle of beer (355 mL), one 5 oz glass of wine (148 mL), or one 1 oz glass of hard liquor (44 mL). General instructions  Schedule regular health, dental, and eye exams.  Stay current with your vaccines.  Tell your health care provider if: ? You often feel depressed. ? You have ever been abused or do not feel safe at home. Summary  Adopting a healthy lifestyle and getting preventive care are important in promoting health and wellness.  Follow your health care provider's instructions about healthy diet, exercising, and getting tested or screened for diseases.  Follow your health care provider's instructions on monitoring your cholesterol and blood pressure. This information is not intended to replace advice given to you by your health care provider. Make sure you discuss any questions you have with your health care provider. Document Revised: 02/04/2018 Document Reviewed: 02/04/2018 Elsevier Patient Education  2020 Elsevier Inc.  

## 2019-07-29 NOTE — Progress Notes (Signed)
Subjective:   Dana [name] is a 74 y.o. female who presents for Medicare Annual (Subsequent) preventive examination. This wellness visit is conducted by a nurse.  The patient's medications were reviewed and reconciled since the patient's last visit.  History details were provided by the patient.  The history appears to be reliable.    Patient's last AWV was one year ago.   Medical History: Patient history and Family history was reviewed  Medications, Allergies, and preventative health maintenance was reviewed and updated.  Review of Systems:  Review of Systems  Constitutional: Negative.   HENT: Negative.   Eyes: Negative.   Respiratory: Negative.  Negative for cough, chest tightness and shortness of breath.   Cardiovascular: Negative.  Negative for chest pain and palpitations.  Gastrointestinal: Negative.   Genitourinary: Negative.   Musculoskeletal: Positive for back pain.  Neurological: Negative.  Negative for dizziness, numbness and headaches.  Psychiatric/Behavioral: Negative.  Negative for agitation, dysphoric mood and suicidal ideas.   Cardiac Risk Factors include: none     Objective:     Vitals: BP 124/78 (BP Location: Left Arm, Patient Position: Sitting, Cuff Size: Large)   Pulse 62   Temp 98.4 F (36.9 C) (Temporal)   Resp 16   Ht 5' (1.524 m)   Wt 134 lb (60.8 kg)   SpO2 97%   BMI 26.17 kg/m   Body mass index is 26.17 kg/m.  Advanced Directives 07/29/2019  Does Patient Have a Medical Advance Directive? Yes  Type of Paramedic of [place];Living will  Does patient want to make changes to medical advance directive? No - Patient declined  Copy of [place] in Chart? No - copy requested    Tobacco Social History   Tobacco Use  Smoking Status Never Smoker  Smokeless Tobacco Never Used     Counseling given: Not Answered   Clinical Intake:  Pre-visit preparation completed: Yes  Pain : 0-10 Pain Score: 2    Pain Type: Chronic pain Pain Location: Back Pain Descriptors / Indicators: Aching Pain Frequency: Intermittent     BMI - recorded: 26.17 Nutritional Status: BMI 25 -29 Overweight Nutritional Risks: None Diabetes: Yes CBG done?: No Did pt. bring in CBG monitor from home?: No  How often do you need to have someone help you when you read instructions, pamphlets, or other written materials from your doctor or pharmacy?: 1 - Never  Interpreter Needed?: No     Past Medical History:  Diagnosis Date  . Anxiety disorder 07/19/2019  . Chronic back pain 03/10/2018  . Depression   . History of ovarian cancer   . Hyperlipidemia 04/2015  . Hypertension 02/2019  . Hypothyroidism 03/10/2018  . Impingement syndrome of right shoulder 05/12/2014  . Insomnia due to other mental disorder 07/19/2019  . Right shoulder pain 05/12/2014  . Sciatic nerve pain, left 07/19/2019  . Sciatic nerve pain, right 07/19/2019   Past Surgical History:  Procedure Laterality Date  . ABDOMINAL HYSTERECTOMY  1986   Total, also had chemo  . APPENDECTOMY  1986  . LAPAROTOMY  1987   To rule out recurrent ovarian cancer  . TONSILLECTOMY     Family History  Problem Relation Age of Onset  . Congestive Heart Failure Mother   . Osteoarthritis Mother    Social History   Socioeconomic History  . Marital status: Married    Spouse name: Not on file  . Number of children: Not on file  . Years of education:  Not on file  . Highest education level: Not on file  Occupational History  . Not on file  Tobacco Use  . Smoking status: Never Smoker  . Smokeless tobacco: Never Used  Substance and Sexual Activity  . Alcohol use: Yes    Comment: occasionally  . Drug use: Yes    Types: Solvent inhalants  . Sexual activity: Not on file  Other Topics Concern  . Not on file  Social History Narrative  . Not on file   Social Determinants of Health   Financial Resource Strain:   . Difficulty of Paying Living Expenses:    Food Insecurity:   . Worried About Charity fundraiser in the Last Year:   . Arboriculturist in the Last Year:   Transportation Needs:   . Film/video editor (Medical):   Marland Kitchen Lack of Transportation (Non-Medical):   Physical Activity:   . Days of Exercise per Week:   . Minutes of Exercise per Session:   Stress:   . Feeling of Stress :   Social Connections:   . Frequency of Communication with Friends and Family:   . Frequency of Social Gatherings with Friends and Family:   . Attends Religious Services:   . Active Member of Clubs or Organizations:   . Attends Archivist Meetings:   Marland Kitchen Marital Status:     Outpatient Encounter Medications as of 07/29/2019  Medication Sig  . ALPRAZolam (XANAX) 0.25 MG tablet Take 0.25 mg by mouth daily as needed.  Marland Kitchen atorvastatin (LIPITOR) 10 MG tablet TAKE 1 TABLET ONCE DAILY  . BYSTOLIC 20 MG TABS TAKE 1 TABLET BY MOUTH ONCE DAILY  . levothyroxine (SYNTHROID) 88 MCG tablet TAKE 1 TABLET ONCE DAILY  . meloxicam (MOBIC) 7.5 MG tablet Take 1 tablet (7.5 mg total) by mouth 2 (two) times daily as needed for pain.  Marland Kitchen PREMARIN 0.625 MG tablet Take 0.625 mg by mouth daily.  Marland Kitchen losartan (COZAAR) 25 MG tablet Take 1 tablet (25 mg total) by mouth daily.   No facility-administered encounter medications on file as of 07/29/2019.    Activities of Daily Living In your present state of health, do you have any difficulty performing the following activities: 07/29/2019  Hearing? N  Vision? N  Difficulty concentrating or making decisions? N  Walking or climbing stairs? N  Dressing or bathing? N  Doing errands, shopping? N  Preparing Food and eating ? N  Using the Toilet? N  In the past six months, have you accidently leaked urine? N  Do you have problems with loss of bowel control? N  Managing your Medications? N  Managing your Finances? N  Housekeeping or managing your Housekeeping? N  Some recent data might be hidden    Patient Care Team: Rochel Brome, MD as PCP - General (Family Medicine) Park Liter, MD as Consulting Physician (Cardiology) Marlaine Hind, MD as Consulting Physician (Physical Medicine and Rehabilitation)    Assessment:   This is a routine wellness examination for Dana [name].  Exercise Activities and Dietary recommendations Current Exercise Habits: The patient does not participate in regular exercise at present, Exercise limited by: Other - see comments(Back Pain)  Goals    . Exercise 3x per week (30 min per time)     As tolerated with back pain    . Keep A1C Low     Last A1C 5.7, patient will modify diet and exercise to remain < Prediabetic at next lab check  Fall Risk Fall Risk  07/29/2019 07/20/2019  Falls in the past year? 0 0  Number falls in past yr: 0 0  Injury with Fall? 0 -  Risk for fall due to : No Fall Risks -  Follow up Falls evaluation completed;Falls prevention discussed -   Is the patient's home free of loose throw rugs in walkways, pet beds, electrical cords, etc?   yes      Grab bars in the bathroom? no      Handrails on the stairs?   yes      Adequate lighting?   yes   Depression Screen PHQ 2/9 Scores 07/29/2019 07/20/2019  PHQ - 2 Score 0 0     Cognitive Function     6CIT Screen 07/29/2019  What Year? 0 points  What month? 0 points  What time? 0 points  Count back from 20 0 points  Months in reverse 0 points  Repeat phrase 0 points  Total Score 0    Immunization History  Administered Date(s) Administered  . Influenza-Unspecified 11/25/2017, 11/30/2018  . Pneumococcal Conjugate-13 05/13/2013, 05/30/2014  . Pneumococcal Polysaccharide-23 08/03/2011  . Tdap 03/11/2013  . Zoster Recombinat (Shingrix) 01/26/2018  Pfizer COVID Vaccines Complete - please bring card to next visit so we can document dates.  Screening Tests Health Maintenance  Topic Date Due  . Hepatitis C Screening  Never done  . COVID-19 Vaccine (1) Never done  . DEXA SCAN  07/29/2019  .  MAMMOGRAM  08/21/2019  . INFLUENZA VACCINE  09/26/2019  . TETANUS/TDAP  03/12/2023  . PNA vac Low Risk Adult  Completed  . COLONOSCOPY  Discontinued    Cancer Screenings: Lung: Low Dose CT Chest recommended if Age 8-80 years, 30 pack-year currently smoking OR have quit w/in 15years. Patient does not qualify. Breast:  Up to date on Mammogram? Yes   Up to date of Bone Density/Dexa? No Colorectal: Cologuard Negative 2020     Plan:    Counseling was provided today regarding the following topics: healthy eating habits, home safety, vitamin and mineral supplementation (calcium and Vit D), regular exercise, breast self-exam, tobacco avoidance, limitation of alcohol intake, use of seat belts, firearm safety, and fall prevention.  Annual recommendations include: influenza vaccine, dental cleanings, and eye exams.  Mammogram is scheduled  Please bring COVID Vaccine Card to next appointment so the correct dates can be documented    I have personally reviewed and noted the following in the patient's chart:   . Medical and social history . Use of alcohol, tobacco or illicit drugs  . Current medications and supplements . Functional ability and status . Nutritional status . Physical activity . Advanced directives . List of other physicians . Hospitalizations, surgeries, and ER visits in previous 12 months . Vitals . Screenings to include cognitive, depression, and falls . Referrals and appointments  In addition, I have reviewed and discussed with patient certain preventive protocols, quality metrics, and best practice recommendations. A written personalized care plan for preventive services as well as general preventive health recommendations were provided to patient.     Erie Noe, LPN  [id number]

## 2019-08-04 DIAGNOSIS — H40013 Open angle with borderline findings, low risk, bilateral: Secondary | ICD-10-CM | POA: Diagnosis not present

## 2019-08-04 DIAGNOSIS — H43813 Vitreous degeneration, bilateral: Secondary | ICD-10-CM | POA: Diagnosis not present

## 2019-08-04 DIAGNOSIS — H35033 Hypertensive retinopathy, bilateral: Secondary | ICD-10-CM | POA: Diagnosis not present

## 2019-08-04 DIAGNOSIS — H35363 Drusen (degenerative) of macula, bilateral: Secondary | ICD-10-CM | POA: Diagnosis not present

## 2019-08-23 DIAGNOSIS — Z1231 Encounter for screening mammogram for malignant neoplasm of breast: Secondary | ICD-10-CM | POA: Diagnosis not present

## 2019-08-24 DIAGNOSIS — Z0120 Encounter for dental examination and cleaning without abnormal findings: Secondary | ICD-10-CM | POA: Diagnosis not present

## 2019-09-02 DIAGNOSIS — Z880 Allergy status to penicillin: Secondary | ICD-10-CM | POA: Diagnosis not present

## 2019-09-02 DIAGNOSIS — R928 Other abnormal and inconclusive findings on diagnostic imaging of breast: Secondary | ICD-10-CM | POA: Diagnosis not present

## 2019-09-02 DIAGNOSIS — R922 Inconclusive mammogram: Secondary | ICD-10-CM | POA: Diagnosis not present

## 2019-09-02 DIAGNOSIS — Z8041 Family history of malignant neoplasm of ovary: Secondary | ICD-10-CM | POA: Diagnosis not present

## 2019-09-02 DIAGNOSIS — Z90722 Acquired absence of ovaries, bilateral: Secondary | ICD-10-CM | POA: Diagnosis not present

## 2019-09-02 DIAGNOSIS — N6314 Unspecified lump in the right breast, lower inner quadrant: Secondary | ICD-10-CM | POA: Diagnosis not present

## 2019-09-02 DIAGNOSIS — C569 Malignant neoplasm of unspecified ovary: Secondary | ICD-10-CM | POA: Diagnosis not present

## 2019-09-15 DIAGNOSIS — Z6824 Body mass index (BMI) 24.0-24.9, adult: Secondary | ICD-10-CM | POA: Diagnosis not present

## 2019-09-15 DIAGNOSIS — Z9221 Personal history of antineoplastic chemotherapy: Secondary | ICD-10-CM | POA: Diagnosis not present

## 2019-09-15 DIAGNOSIS — Z79890 Hormone replacement therapy (postmenopausal): Secondary | ICD-10-CM | POA: Diagnosis not present

## 2019-09-15 DIAGNOSIS — Z880 Allergy status to penicillin: Secondary | ICD-10-CM | POA: Diagnosis not present

## 2019-09-15 DIAGNOSIS — Z8543 Personal history of malignant neoplasm of ovary: Secondary | ICD-10-CM | POA: Diagnosis not present

## 2019-09-15 DIAGNOSIS — Z90710 Acquired absence of both cervix and uterus: Secondary | ICD-10-CM | POA: Diagnosis not present

## 2019-09-15 DIAGNOSIS — N6011 Diffuse cystic mastopathy of right breast: Secondary | ICD-10-CM | POA: Diagnosis not present

## 2019-09-15 DIAGNOSIS — Z8041 Family history of malignant neoplasm of ovary: Secondary | ICD-10-CM | POA: Diagnosis not present

## 2019-09-15 DIAGNOSIS — Z90722 Acquired absence of ovaries, bilateral: Secondary | ICD-10-CM | POA: Diagnosis not present

## 2019-09-15 DIAGNOSIS — N6031 Fibrosclerosis of right breast: Secondary | ICD-10-CM | POA: Diagnosis not present

## 2019-09-15 DIAGNOSIS — R928 Other abnormal and inconclusive findings on diagnostic imaging of breast: Secondary | ICD-10-CM | POA: Diagnosis not present

## 2019-09-15 DIAGNOSIS — N6001 Solitary cyst of right breast: Secondary | ICD-10-CM | POA: Diagnosis not present

## 2019-09-15 DIAGNOSIS — N6091 Unspecified benign mammary dysplasia of right breast: Secondary | ICD-10-CM | POA: Diagnosis not present

## 2019-09-21 ENCOUNTER — Other Ambulatory Visit: Payer: Self-pay | Admitting: Cardiology

## 2019-09-27 ENCOUNTER — Other Ambulatory Visit: Payer: Self-pay | Admitting: Family Medicine

## 2019-10-01 DIAGNOSIS — H524 Presbyopia: Secondary | ICD-10-CM | POA: Diagnosis not present

## 2019-10-20 NOTE — Progress Notes (Signed)
Subjective:  Patient ID: Dana [name], female    DOB: 1945/08/22  Age: 74 y.o. MRN: [id number]  Chief Complaint  Patient presents with  . Hypertension  . Hyperlipidemia  . Hypothyroidism    HPI Patient is a 56 year [name] white female who presents in follow-up of hypertension, hyperlipidemia and hypothyroidism.   Her hypertension was recently diagnosed in the February 2021.  Renal arterial ultrasound was normal in February 2021. Following with by Dr. Agustin Cree. Dr. Agustin Cree prescribed losartan 25 mg once daily with the Bystolic 20 mg once daily.    Patient also takes levothyroxine for her hypothyroidism which has been stable for some time.    In addition she takes Lipitor for hyperlipidemia.  The patient does eat healthy and is very active.  Patient is having lumbar pain and has seen Dr. Brien Few and has had two ESI. She tried naproxen, but felt like her bp increases. NCV EMG was done. Helped some.  Social History   Socioeconomic History  . Marital status: Married    Spouse name: Not on file  . Number of children: Not on file  . Years of education: Not on file  . Highest education level: Not on file  Occupational History  . Not on file  Tobacco Use  . Smoking status: Never Smoker  . Smokeless tobacco: Never Used  Substance and Sexual Activity  . Alcohol use: Yes    Comment: occasionally  . Drug use: Yes    Types: Solvent inhalants  . Sexual activity: Not on file  Other Topics Concern  . Not on file  Social History Narrative  . Not on file   Social Determinants of Health   Financial Resource Strain:   . Difficulty of Paying Living Expenses: Not on file  Food Insecurity:   . Worried About Charity fundraiser in the Last Year: Not on file  . Ran Out of Food in the Last Year: Not on file  Transportation Needs:   . Lack of Transportation (Medical): Not on file  . Lack of Transportation (Non-Medical): Not on file  Physical Activity:   . Days of Exercise per Week:  Not on file  . Minutes of Exercise per Session: Not on file  Stress:   . Feeling of Stress : Not on file  Social Connections:   . Frequency of Communication with Friends and Family: Not on file  . Frequency of Social Gatherings with Friends and Family: Not on file  . Attends Religious Services: Not on file  . Active Member of Clubs or Organizations: Not on file  . Attends Archivist Meetings: Not on file  . Marital Status: Not on file   Past Medical History:  Diagnosis Date  . Anxiety disorder 07/19/2019  . Chronic back pain 03/10/2018  . Depression   . History of ovarian cancer   . Hyperlipidemia 04/2015  . Hypertension 02/2019  . Hypothyroidism 03/10/2018  . Impingement syndrome of right shoulder 05/12/2014  . Insomnia due to other mental disorder 07/19/2019  . Right shoulder pain 05/12/2014  . Sciatic nerve pain, left 07/19/2019  . Sciatic nerve pain, right 07/19/2019   Family History  Problem Relation Age of Onset  . Congestive Heart Failure Mother   . Osteoarthritis Mother     Review of Systems  Constitutional: Negative for chills, fatigue and fever.  HENT: Negative for congestion, ear pain and sore throat.   Respiratory: Negative for cough and shortness of breath.   Cardiovascular: Negative for chest  pain.  Gastrointestinal: Negative for abdominal pain, constipation, diarrhea, nausea and vomiting.  Endocrine: Negative for polydipsia, polyphagia and polyuria.  Genitourinary: Negative for dysuria and urgency.  Musculoskeletal: Negative for arthralgias and myalgias.  Neurological: Negative for dizziness and headaches.  Psychiatric/Behavioral: Negative for dysphoric mood. The patient is not nervous/anxious.    Objective:  BP 104/72   Pulse 60   Temp (!) 96.1 F (35.6 C)   Resp 14   Ht 5' (1.524 m)   Wt 135 lb 6.4 oz (61.4 kg)   BMI 26.44 kg/m   BP/Weight 10/21/2019 05/03/9371 05/28/8766  Systolic BP 115 726 203  Diastolic BP 72 78 78  Wt. (Lbs) 135.4 134 138    BMI 26.44 26.17 24.45    Physical Exam Vitals reviewed.  Constitutional:      General: She is not in acute distress.    Appearance: Normal appearance. She is normal weight.  Cardiovascular:     Rate and Rhythm: Normal rate and regular rhythm.     Pulses: Normal pulses.     Heart sounds: Normal heart sounds. No murmur.  Pulmonary:     Effort: Pulmonary effort is normal.     Breath sounds: Normal breath sounds.  Abdominal:     General: Bowel sounds are normal.     Palpations: Abdomen is soft. There is no mass.     Tenderness: There is no abdominal tenderness.  Skin:    General: Skin is warm and dry.  Neurological:     Mental Status: She is alert and oriented to person, place, and time.     Cranial Nerves: No cranial nerve deficit.  Psychiatric:        Mood and Affect: Mood normal.        Behavior: Behavior normal.      Lab Results  Component Value Date   WBC 6.2 10/21/2019   HGB 14.5 10/21/2019   HCT 41.7 10/21/2019   PLT 335 10/21/2019   GLUCOSE 90 10/21/2019   CHOL 198 10/21/2019   TRIG 75 10/21/2019   HDL 88 10/21/2019   LDLCALC 97 10/21/2019   ALT 12 10/21/2019   AST 16 10/21/2019   NA 140 10/21/2019   K 4.9 10/21/2019   CL 101 10/21/2019   CREATININE 0.74 10/21/2019   BUN 14 10/21/2019   CO2 24 10/21/2019   TSH 3.220 07/20/2019   HGBA1C 5.7 (H) 07/20/2019      Assessment & Plan:  1. Essential hypertension The current medical regimen is effective;  continue present plan and medications. - Comprehensive metabolic panel - CBC with Differential/Platelet  2. Hypothyroidism due to acquired atrophy of thyroid The current medical regimen is effective;  continue present plan and medications. - TSH  3. Dyslipidemia Recommend continue to work on eating healthy diet and exercise. - Lipid panel  4. Lumbar back pain Follow up with Dr. Brien Few.   Follow-up: Return in about 6 months (around 04/22/2020) for fasting.  A CLINICAL SUMMARY including a written  plan identify barriers to care unique to individual due to social or financial issues and help create solutions together. and a patient's and the patient's families understanding of their medical issues and care needs

## 2019-10-21 ENCOUNTER — Encounter: Payer: Self-pay | Admitting: Family Medicine

## 2019-10-21 ENCOUNTER — Other Ambulatory Visit: Payer: Self-pay

## 2019-10-21 ENCOUNTER — Ambulatory Visit (INDEPENDENT_AMBULATORY_CARE_PROVIDER_SITE_OTHER): Payer: Medicare Other | Admitting: Family Medicine

## 2019-10-21 VITALS — BP 104/72 | HR 60 | Temp 96.1°F | Resp 14 | Ht 60.0 in | Wt 135.4 lb

## 2019-10-21 DIAGNOSIS — E785 Hyperlipidemia, unspecified: Secondary | ICD-10-CM | POA: Diagnosis not present

## 2019-10-21 DIAGNOSIS — D485 Neoplasm of uncertain behavior of skin: Secondary | ICD-10-CM | POA: Diagnosis not present

## 2019-10-21 DIAGNOSIS — E034 Atrophy of thyroid (acquired): Secondary | ICD-10-CM

## 2019-10-21 DIAGNOSIS — E782 Mixed hyperlipidemia: Secondary | ICD-10-CM

## 2019-10-21 DIAGNOSIS — M795 Residual foreign body in soft tissue: Secondary | ICD-10-CM | POA: Diagnosis not present

## 2019-10-21 DIAGNOSIS — I10 Essential (primary) hypertension: Secondary | ICD-10-CM

## 2019-10-21 DIAGNOSIS — L0889 Other specified local infections of the skin and subcutaneous tissue: Secondary | ICD-10-CM | POA: Diagnosis not present

## 2019-10-21 DIAGNOSIS — F419 Anxiety disorder, unspecified: Secondary | ICD-10-CM | POA: Diagnosis not present

## 2019-10-21 MED ORDER — DOXYCYCLINE HYCLATE 100 MG PO TABS
100.0000 mg | ORAL_TABLET | Freq: Two times a day (BID) | ORAL | 0 refills | Status: DC
Start: 2019-10-21 — End: 2019-11-02

## 2019-10-21 NOTE — Patient Instructions (Signed)
Dermatology appointment at 2 pm today.  Start on doxycycline.

## 2019-10-22 ENCOUNTER — Other Ambulatory Visit: Payer: Self-pay | Admitting: Physician Assistant

## 2019-10-22 LAB — COMPREHENSIVE METABOLIC PANEL
ALT: 12 IU/L (ref 0–32)
AST: 16 IU/L (ref 0–40)
Albumin/Globulin Ratio: 2 (ref 1.2–2.2)
Albumin: 4.3 g/dL (ref 3.7–4.7)
Alkaline Phosphatase: 55 IU/L (ref 48–121)
BUN/Creatinine Ratio: 19 (ref 12–28)
BUN: 14 mg/dL (ref 8–27)
Bilirubin Total: 0.4 mg/dL (ref 0.0–1.2)
CO2: 24 mmol/L (ref 20–29)
Calcium: 9.8 mg/dL (ref 8.7–10.3)
Chloride: 101 mmol/L (ref 96–106)
Creatinine, Ser: 0.74 mg/dL (ref 0.57–1.00)
GFR calc Af Amer: 93 mL/min/{1.73_m2} (ref >59)
GFR calc non Af Amer: 81 mL/min/{1.73_m2} (ref >59)
Globulin, Total: 2.1 g/dL (ref 1.5–4.5)
Glucose: 90 mg/dL (ref 65–99)
Potassium: 4.9 mmol/L (ref 3.5–5.2)
Sodium: 140 mmol/L (ref 134–144)
Total Protein: 6.4 g/dL (ref 6.0–8.5)

## 2019-10-22 LAB — CBC WITH DIFFERENTIAL/PLATELET
Basophils Absolute: 0 10*3/uL (ref 0.0–0.2)
Basos: 1 %
EOS (ABSOLUTE): 0.1 10*3/uL (ref 0.0–0.4)
Eos: 1 %
Hematocrit: 41.7 % (ref 34.0–46.6)
Hemoglobin: 14.5 g/dL (ref 11.1–15.9)
Immature Grans (Abs): 0 10*3/uL (ref 0.0–0.1)
Immature Granulocytes: 0 %
Lymphocytes Absolute: 1.7 10*3/uL (ref 0.7–3.1)
Lymphs: 28 %
MCH: 32.7 pg (ref 26.6–33.0)
MCHC: 34.8 g/dL (ref 31.5–35.7)
MCV: 94 fL (ref 79–97)
Monocytes Absolute: 0.4 10*3/uL (ref 0.1–0.9)
Monocytes: 6 %
Neutrophils Absolute: 4 10*3/uL (ref 1.4–7.0)
Neutrophils: 64 %
Platelets: 335 10*3/uL (ref 150–450)
RBC: 4.43 x10E6/uL (ref 3.77–5.28)
RDW: 12.8 % (ref 11.7–15.4)
WBC: 6.2 10*3/uL (ref 3.4–10.8)

## 2019-10-22 LAB — LIPID PANEL
Chol/HDL Ratio: 2.3 ratio (ref 0.0–4.4)
Cholesterol, Total: 198 mg/dL (ref 100–199)
HDL: 88 mg/dL (ref >39)
LDL Chol Calc (NIH): 97 mg/dL (ref 0–99)
Triglycerides: 75 mg/dL (ref 0–149)
VLDL Cholesterol Cal: 13 mg/dL (ref 5–40)

## 2019-10-22 LAB — CARDIOVASCULAR RISK ASSESSMENT

## 2019-11-02 ENCOUNTER — Encounter: Payer: Self-pay | Admitting: Family Medicine

## 2019-11-02 NOTE — Progress Notes (Deleted)
Changed to nurse visit

## 2019-11-05 ENCOUNTER — Ambulatory Visit (INDEPENDENT_AMBULATORY_CARE_PROVIDER_SITE_OTHER): Payer: Medicare Other

## 2019-11-05 ENCOUNTER — Encounter: Payer: Self-pay | Admitting: Family Medicine

## 2019-11-05 ENCOUNTER — Other Ambulatory Visit: Payer: Self-pay

## 2019-11-05 DIAGNOSIS — Z23 Encounter for immunization: Secondary | ICD-10-CM | POA: Diagnosis not present

## 2019-12-29 ENCOUNTER — Telehealth: Payer: Self-pay | Admitting: Family Medicine

## 2019-12-29 NOTE — Progress Notes (Signed)
  Chronic Care Management   Outreach Note  12/29/2019 Name: Dana [name] MRN: [id number] DOB: 09-Sep-1945  Referred by: Rochel Brome, MD Reason for referral : Chronic Care Management   An unsuccessful telephone outreach was attempted today. The patient was referred to the pharmacist for assistance with care management and care coordination.   Follow Up Plan:   Hilario Quarry  Upstream Scheduler

## 2020-01-05 ENCOUNTER — Telehealth: Payer: Self-pay | Admitting: Family Medicine

## 2020-01-05 NOTE — Progress Notes (Signed)
  Chronic Care Management   Outreach Note  01/05/2020 Name: Dana [name] MRN: [id number] DOB: [birth year]  Referred by: Rochel Brome, MD Reason for referral : Chronic Care Management   A second unsuccessful telephone outreach was attempted today. The patient was referred to pharmacist for assistance with care management and care coordination.  Follow Up Plan:   Hilario Quarry  Upstream Scheduler

## 2020-01-05 NOTE — Chronic Care Management (AMB) (Signed)
  Chronic Care Management   Note  01/05/2020 Name: Dana [name] MRN: [id number] DOB: 08/04/1945  Dana [name] is a 74 y.o. year [name] female who is a primary care patient of Cox, Kirsten, MD. I reached out to Dana [name] by phone today in response to a referral sent by Ms. Dana [name]'s PCP, Cox, Kirsten, MD.   [name] was given information about Chronic Care Management services today including:  1. CCM service includes personalized support from designated clinical staff supervised by her physician, including individualized plan of care and coordination with other care providers 2. 24/7 contact phone numbers for assistance for urgent and routine care needs. 3. Service will only be billed when office clinical staff spend 20 minutes or more in a month to coordinate care. 4. Only one practitioner may furnish and bill the service in a calendar month. 5. The patient may stop CCM services at any time (effective at the end of the month) by phone call to the office staff.   Patient agreed to services and verbal consent obtained.   Follow up plan:   [place]

## 2020-01-11 ENCOUNTER — Other Ambulatory Visit: Payer: Self-pay | Admitting: Physician Assistant

## 2020-01-28 ENCOUNTER — Other Ambulatory Visit: Payer: Self-pay | Admitting: Family Medicine

## 2020-01-28 DIAGNOSIS — Z8543 Personal history of malignant neoplasm of ovary: Secondary | ICD-10-CM | POA: Insufficient documentation

## 2020-01-28 DIAGNOSIS — F32A Depression, unspecified: Secondary | ICD-10-CM | POA: Insufficient documentation

## 2020-01-31 ENCOUNTER — Ambulatory Visit: Payer: Medicare Other | Admitting: Cardiology

## 2020-01-31 ENCOUNTER — Encounter: Payer: Self-pay | Admitting: Cardiology

## 2020-01-31 ENCOUNTER — Other Ambulatory Visit: Payer: Self-pay

## 2020-01-31 VITALS — BP 124/82 | HR 56 | Ht 63.0 in | Wt 134.0 lb

## 2020-01-31 DIAGNOSIS — E034 Atrophy of thyroid (acquired): Secondary | ICD-10-CM

## 2020-01-31 DIAGNOSIS — E782 Mixed hyperlipidemia: Secondary | ICD-10-CM | POA: Diagnosis not present

## 2020-01-31 DIAGNOSIS — M545 Low back pain, unspecified: Secondary | ICD-10-CM | POA: Diagnosis not present

## 2020-01-31 DIAGNOSIS — I10 Essential (primary) hypertension: Secondary | ICD-10-CM

## 2020-01-31 DIAGNOSIS — G8929 Other chronic pain: Secondary | ICD-10-CM

## 2020-01-31 NOTE — Patient Instructions (Signed)

## 2020-01-31 NOTE — Progress Notes (Signed)
Cardiology Office Note:    Date:  01/31/2020   ID:  [name], DOB 02-07-1946, MRN [id number]  PCP:  Rochel Brome, MD  Cardiologist:  Jenne Campus, MD    Referring MD: Rochel Brome, MD   Chief Complaint  Patient presents with  . Follow-up  I am doing fine except my back  History of Present Illness:    Dana [name] is a 74 y.o. female who was referred to [place] because of difficult to control blood pressure.  She also got dyslipidemia, hypothyroidism.  She comes today 2 months of follow-up overall doing great however problem is her back she says she got good days bad days the days that she is able to walk and move around and try to exercise however there are days that her husband has to help her to get off the back.  Still trying to be active and usually walks about 5 times a week however lately because of poor weather she does not only 3 times a week.  Denies have any palpitation chest pain tightness squeezing pressure burning chest no swelling of lower extremities.  Past Medical History:  Diagnosis Date  . Anxiety disorder 07/19/2019  . Chronic back pain 03/10/2018  . Depression   . Dyslipidemia 07/28/2019  . Dyspnea on exertion 03/19/2019  . Essential hypertension 03/19/2019  . History of ovarian cancer   . Hyperlipidemia 04/2015  . Hypertension 02/2019  . Hypothyroidism 03/10/2018  . Impingement syndrome of right shoulder 05/12/2014  . Insomnia due to other mental disorder 07/19/2019  . Lumbar back pain 07/20/2019  . Mixed hyperlipidemia 03/19/2019  . Right shoulder pain 05/12/2014  . Sciatic nerve pain, left 07/19/2019  . Sciatic nerve pain, right 07/19/2019    Past Surgical History:  Procedure Laterality Date  . ABDOMINAL HYSTERECTOMY  1986   Total, also had chemo  . APPENDECTOMY  1986  . LAPAROTOMY  1987   To rule out recurrent ovarian cancer  . TONSILLECTOMY      Current Medications: Current Meds  Medication Sig  . ALPRAZolam (XANAX) 0.25 MG tablet TAKE 1 TABLET  BY MOUTH DAILY AT BEDTIME AS NEEDED FOR SLEEP  . atorvastatin (LIPITOR) 10 MG tablet TAKE 1 TABLET BY MOUTH ONCE DAILY  . BYSTOLIC 20 MG TABS TAKE 1 TABLET BY MOUTH ONCE DAILY  . levothyroxine (SYNTHROID) 88 MCG tablet TAKE 1 TABLET BY MOUTH ONCE DAILY  . loratadine (CLARITIN) 10 MG tablet Take 10 mg by mouth daily.  Marland Kitchen losartan (COZAAR) 25 MG tablet Take 1 tablet (25 mg total) by mouth daily.  . meloxicam (MOBIC) 7.5 MG tablet Take 1 tablet (7.5 mg total) by mouth 2 (two) times daily as needed for pain.  Marland Kitchen PREMARIN 0.625 MG tablet Take 0.625 mg by mouth daily.     Allergies:   Penicillins and Pravastatin   Social History   Socioeconomic History  . Marital status: Married    Spouse name: Not on file  . Number of children: Not on file  . Years of education: Not on file  . Highest education level: Not on file  Occupational History  . Not on file  Tobacco Use  . Smoking status: Never Smoker  . Smokeless tobacco: Never Used  Substance and Sexual Activity  . Alcohol use: Yes    Comment: occasionally  . Drug use: Yes    Types: Solvent inhalants  . Sexual activity: Not on file  Other Topics Concern  . Not on file  Social History  Narrative  . Not on file   Social Determinants of Health   Financial Resource Strain:   . Difficulty of Paying Living Expenses: Not on file  Food Insecurity:   . Worried About Charity fundraiser in the Last Year: Not on file  . Ran Out of Food in the Last Year: Not on file  Transportation Needs:   . Lack of Transportation (Medical): Not on file  . Lack of Transportation (Non-Medical): Not on file  Physical Activity:   . Days of Exercise per Week: Not on file  . Minutes of Exercise per Session: Not on file  Stress:   . Feeling of Stress : Not on file  Social Connections:   . Frequency of Communication with Friends and Family: Not on file  . Frequency of Social Gatherings with Friends and Family: Not on file  . Attends Religious Services: Not on  file  . Active Member of Clubs or Organizations: Not on file  . Attends Archivist Meetings: Not on file  . Marital Status: Not on file     Family History: The patient's family history includes Congestive Heart Failure in her mother; Osteoarthritis in her mother. ROS:   Please see the history of present illness.    All 14 point review of systems negative except as described per history of present illness  EKGs/Labs/Other Studies Reviewed:      Recent Labs: 07/20/2019: TSH 3.220 10/21/2019: ALT 12; BUN 14; Creatinine, Ser 0.74; Hemoglobin 14.5; Platelets 335; Potassium 4.9; Sodium 140  Recent Lipid Panel    Component Value Date/Time   CHOL 198 10/21/2019 0940   TRIG 75 10/21/2019 0940   HDL 88 10/21/2019 0940   CHOLHDL 2.3 10/21/2019 0940   LDLCALC 97 10/21/2019 0940    Physical Exam:    VS:  BP 124/82 (BP Location: Right Arm, Patient Position: Sitting)   Pulse (!) 56   Ht 5\' 3"  (1.6 m)   Wt 134 lb (60.8 kg)   SpO2 96%   BMI 23.74 kg/m     Wt Readings from Last 3 Encounters:  01/31/20 134 lb (60.8 kg)  10/21/19 135 lb 6.4 oz (61.4 kg)  07/29/19 134 lb (60.8 kg)     GEN:  Well nourished, well developed in no acute distress HEENT: Normal NECK: No JVD; No carotid bruits LYMPHATICS: No lymphadenopathy CARDIAC: RRR, no murmurs, no rubs, no gallops RESPIRATORY:  Clear to auscultation without rales, wheezing or rhonchi  ABDOMEN: Soft, non-tender, non-distended MUSCULOSKELETAL:  No edema; No deformity  SKIN: Warm and dry LOWER EXTREMITIES: no swelling NEUROLOGIC:  Alert and oriented x 3 PSYCHIATRIC:  Normal affect   ASSESSMENT:    1. Essential hypertension   2. Hypothyroidism due to acquired atrophy of thyroid   3. Mixed hyperlipidemia   4. Chronic midline low back pain without sciatica    PLAN:    In order of problems listed above:  1. Essential hypertension: Blood pressure well controlled today 124/80.  She check her blood pressure at home always  good.  We will continue present management. 2. Hypothyroidism therapy managed by primary care physician.  I did review K PN which show me last data from May 2021 with TSH of 3.22. 3. Mixed dyslipidemia: She is taking moderate intensity statin in form of Lipitor 10 mg daily, her LDL 97 but at the same time her HDL is 88 this is data from K PN from October 21, 2019.  We will continue present management. 4. Chronic  back pain which is the biggest problem for her.  That is what slows her down.  She is working with pain clinic as well with some neuro largest.  She does not have any long-term plans for that. 5. We did talk about healthy lifestyle need to exercise on the regular basis and she is doing this with some difficulties.   Medication Adjustments/Labs and Tests Ordered: Current medicines are reviewed at length with the patient today.  Concerns regarding medicines are outlined above.  No orders of the defined types were placed in this encounter.  Medication changes: No orders of the defined types were placed in this encounter.   Signed, Park Liter, MD, [facility] 01/31/2020 8:22 AM    [place]

## 2020-02-04 DIAGNOSIS — H40013 Open angle with borderline findings, low risk, bilateral: Secondary | ICD-10-CM | POA: Diagnosis not present

## 2020-02-07 DIAGNOSIS — Z20822 Contact with and (suspected) exposure to covid-19: Secondary | ICD-10-CM | POA: Diagnosis not present

## 2020-02-09 ENCOUNTER — Ambulatory Visit (INDEPENDENT_AMBULATORY_CARE_PROVIDER_SITE_OTHER): Payer: Medicare Other | Admitting: Nurse Practitioner

## 2020-02-09 ENCOUNTER — Encounter: Payer: Self-pay | Admitting: Nurse Practitioner

## 2020-02-09 VITALS — BP 140/70

## 2020-02-09 DIAGNOSIS — J019 Acute sinusitis, unspecified: Secondary | ICD-10-CM | POA: Diagnosis not present

## 2020-02-09 DIAGNOSIS — R059 Cough, unspecified: Secondary | ICD-10-CM

## 2020-02-09 MED ORDER — AZITHROMYCIN 250 MG PO TABS
ORAL_TABLET | ORAL | 0 refills | Status: DC
Start: 2020-02-09 — End: 2020-04-20

## 2020-02-09 MED ORDER — FLUTICASONE PROPIONATE 50 MCG/ACT NA SUSP: 2.0000 | Freq: Every day | NASAL | 6 refills | Status: DC

## 2020-02-09 MED ORDER — BENZONATATE 100 MG PO CAPS: 100.0000 mg | ORAL_CAPSULE | Freq: Three times a day (TID) | ORAL | 0 refills | Status: DC | PRN

## 2020-02-09 NOTE — Progress Notes (Signed)
Virtual Visit via Telephone Note   This visit type was conducted due to national recommendations for restrictions regarding the COVID-19 Pandemic (e.g. social distancing) in an effort to limit this patient's exposure and mitigate transmission in our community.  Due to her co-morbid illnesses, this patient is at least at moderate risk for complications without adequate follow up.  This format is felt to be most appropriate for this patient at this time.  The patient did not have access to video technology/had technical difficulties with video requiring transitioning to audio format only (telephone).  All issues noted in this document were discussed and addressed.  No physical exam could be performed with this format.  Patient verbally consented to a telehealth visit.   Date:  02/09/2020   ID:  Dana [name], DOB 1945-12-22, MRN [id number]  Patient Location: Home Provider Location: Office/Clinic  PCP:  Rochel Brome, MD   Evaluation Performed:  Established patient, acute telemedicine visit  Chief Complaint:  Cough  History of Present Illness:    Dana [name] is a 74 y.o. female with congested sinus congestion/pressure, post-nasal drip, sore throat, and congested cough. She has experienced malaise and decreased appetite. Onset of symptoms was 5-days ago. She denies fever or dyspnea. Treatment includes Mucinex and Excedrin which provided minimal relief. She states Mucinex caused elevated blood pressure.Two COVID-19 tests have been negative, one at home and one at local pharmacy. She has obtained COVID-19 vaccines and booster shot. Seasonal flu immunization has been given. She has a past medical history of hypertension, hyperlipidemia, hypothyroidism, depression, and ovarian cancer that required chemotherapy/surgery.   The patient does have symptoms concerning for COVID-19 infection (fever, chills, cough, or new shortness of breath).    Past Medical History:  Diagnosis Date  . Anxiety  disorder 07/19/2019  . Chronic back pain 03/10/2018  . Depression   . Dyslipidemia 07/28/2019  . Dyspnea on exertion 03/19/2019  . Essential hypertension 03/19/2019  . History of ovarian cancer   . Hyperlipidemia 04/2015  . Hypertension 02/2019  . Hypothyroidism 03/10/2018  . Impingement syndrome of right shoulder 05/12/2014  . Insomnia due to other mental disorder 07/19/2019  . Lumbar back pain 07/20/2019  . Mixed hyperlipidemia 03/19/2019  . Right shoulder pain 05/12/2014  . Sciatic nerve pain, left 07/19/2019  . Sciatic nerve pain, right 07/19/2019    Past Surgical History:  Procedure Laterality Date  . ABDOMINAL HYSTERECTOMY  1986   Total, also had chemo  . APPENDECTOMY  1986  . LAPAROTOMY  1987   To rule out recurrent ovarian cancer  . TONSILLECTOMY      Family History  Problem Relation Age of Onset  . Congestive Heart Failure Mother   . Osteoarthritis Mother     Social History   Socioeconomic History  . Marital status: Married    Spouse name: Not on file  . Number of children: Not on file  . Years of education: Not on file  . Highest education level: Not on file  Occupational History  . Not on file  Tobacco Use  . Smoking status: Never Smoker  . Smokeless tobacco: Never Used  Substance and Sexual Activity  . Alcohol use: Yes    Comment: occasionally  . Drug use: Yes    Types: Solvent inhalants  . Sexual activity: Not on file  Other Topics Concern  . Not on file  Social History Narrative  . Not on file   Social Determinants of Health   Financial Resource Strain: Not  on file  Food Insecurity: Not on file  Transportation Needs: Not on file  Physical Activity: Not on file  Stress: Not on file  Social Connections: Not on file  Intimate Partner Violence: Not on file    Outpatient Medications Prior to Visit  Medication Sig Dispense Refill  . ALPRAZolam (XANAX) 0.25 MG tablet TAKE 1 TABLET BY MOUTH DAILY AT BEDTIME AS NEEDED FOR SLEEP 30 tablet 4  .  atorvastatin (LIPITOR) 10 MG tablet TAKE 1 TABLET BY MOUTH ONCE DAILY 90 tablet 0  . BYSTOLIC 20 MG TABS TAKE 1 TABLET BY MOUTH ONCE DAILY 90 tablet 2  . levothyroxine (SYNTHROID) 88 MCG tablet TAKE 1 TABLET BY MOUTH ONCE DAILY 90 tablet 1  . loratadine (CLARITIN) 10 MG tablet Take 10 mg by mouth daily.    Marland Kitchen losartan (COZAAR) 25 MG tablet Take 1 tablet (25 mg total) by mouth daily. 90 tablet 3  . meloxicam (MOBIC) 7.5 MG tablet Take 1 tablet (7.5 mg total) by mouth 2 (two) times daily as needed for pain. 60 tablet 1  . PREMARIN 0.625 MG tablet Take 0.625 mg by mouth daily.     No facility-administered medications prior to visit.    Allergies:   Penicillins and Pravastatin   Social History   Tobacco Use  . Smoking status: Never Smoker  . Smokeless tobacco: Never Used  Substance Use Topics  . Alcohol use: Yes    Comment: occasionally  . Drug use: Yes    Types: Solvent inhalants     Review of Systems  Constitutional: Positive for malaise/fatigue. Negative for chills and fever.       Decreased appetite  HENT: Positive for congestion, sinus pain (sinus pressure and tenderness) and sore throat.        Post-nasal drip  Respiratory: Positive for cough (persistent congested cough). Negative for shortness of breath.   Cardiovascular: Negative for chest pain and orthopnea.  Gastrointestinal: Negative for nausea and vomiting.       No appetite  Genitourinary: Negative for dysuria.  Musculoskeletal: Negative for back pain, falls, joint pain, myalgias and neck pain.  Neurological: Positive for weakness and headaches. Negative for dizziness.  Psychiatric/Behavioral: The patient has insomnia (persistent coughing interfering with rest).      Labs/Other Tests and Data Reviewed:    Recent Labs: 07/20/2019: TSH 3.220 10/21/2019: ALT 12; BUN 14; Creatinine, Ser 0.74; Hemoglobin 14.5; Platelets 335; Potassium 4.9; Sodium 140   Recent Lipid Panel Lab Results  Component Value Date/Time   CHOL  198 10/21/2019 09:40 AM   TRIG 75 10/21/2019 09:40 AM   HDL 88 10/21/2019 09:40 AM   CHOLHDL 2.3 10/21/2019 09:40 AM   LDLCALC 97 10/21/2019 09:40 AM    Wt Readings from Last 3 Encounters:  01/31/20 134 lb (60.8 kg)  10/21/19 135 lb 6.4 oz (61.4 kg)  07/29/19 134 lb (60.8 kg)     Objective:    BP 140/70     Physical Exam No physical exam due to telemedicine visit  ASSESSMENT & PLAN:    1. Acute non-recurrent sinusitis, unspecified location - fluticasone (FLONASE) 50 MCG/ACT nasal spray; Place 2 sprays into both nostrils daily.  Dispense: 16 g; Refill: 6 - azithromycin (ZITHROMAX) 250 MG tablet; Take two tablets by mouth day one Take one tablet by mouth days two-five  Dispense: 6 tablet; Refill: 0  2. Cough - benzonatate (TESSALON) 100 MG capsule; Take 1 capsule (100 mg total) by mouth 3 (three) times daily as needed for  cough.  Dispense: 30 capsule; Refill: 0  Rest, push fluids (especially water) Claritin 10 mg daily for rhinorrhea Flonase nasal spray daily for sinus congestion Tessalon perles three times daily as needed for cough Z-pak-two tablets today, one tablet daily day two-five Notify office if symptoms worsen or fail to imrove  COVID-19 Education: The signs and symptoms of COVID-19 were discussed with the patient and how to seek care for testing (follow up with PCP or arrange E-visit). The importance of social distancing was discussed today.   I spent 10 minutes dedicated to the care of this patient on the date of this telemedicine encounter  Telephone call: [phone]  Follow Up:  Virtual Visit  prn  Signed,  Jerrell Belfast, DNP  02/09/2020 [place]

## 2020-02-28 ENCOUNTER — Other Ambulatory Visit: Payer: Self-pay | Admitting: Family Medicine

## 2020-03-02 ENCOUNTER — Telehealth: Payer: Self-pay

## 2020-03-02 NOTE — Chronic Care Management (AMB) (Deleted)
° ° °  Chronic Care Management Pharmacy Assistant   Name: Dana [name]  MRN: [id number] DOB: 1945/11/16  Reason for Encounter: Disease State call for initial visit   PCP : Blane Ohara, MD  Allergies:   Allergies  Allergen Reactions   Penicillins Rash    Other reaction(s): RASH    Pravastatin Other (See Comments)    Myalgia    Medications: Outpatient Encounter Medications as of 03/02/2020  Medication Sig   ALPRAZolam (XANAX) 0.25 MG tablet TAKE 1 TABLET BY MOUTH DAILY AT BEDTIME AS NEEDED FOR SLEEP   atorvastatin (LIPITOR) 10 MG tablet TAKE 1 TABLET BY MOUTH ONCE DAILY   azithromycin (ZITHROMAX) 250 MG tablet Take two tablets by mouth day one Take one tablet by mouth days two-five   benzonatate (TESSALON) 100 MG capsule Take 1 capsule (100 mg total) by mouth 3 (three) times daily as needed for cough.   BYSTOLIC 20 MG TABS TAKE 1 TABLET BY MOUTH ONCE DAILY   fluticasone (FLONASE) 50 MCG/ACT nasal spray Place 2 sprays into both nostrils daily.   levothyroxine (SYNTHROID) 88 MCG tablet TAKE 1 TABLET BY MOUTH ONCE DAILY   loratadine (CLARITIN) 10 MG tablet Take 10 mg by mouth daily.   losartan (COZAAR) 25 MG tablet Take 1 tablet (25 mg total) by mouth daily.   meloxicam (MOBIC) 7.5 MG tablet Take 1 tablet (7.5 mg total) by mouth 2 (two) times daily as needed for pain.   PREMARIN 0.625 MG tablet Take 0.625 mg by mouth daily.   No facility-administered encounter medications on file as of 03/02/2020.    Current Diagnosis: Patient Active Problem List   Diagnosis Date Noted   History of ovarian cancer    Depression    Dyslipidemia 07/28/2019   Lumbar back pain 07/20/2019   Insomnia due to other mental disorder 07/19/2019   Anxiety disorder 07/19/2019   Sciatic nerve pain, right 07/19/2019   Sciatic nerve pain, left 07/19/2019   Essential hypertension 03/19/2019   Mixed hyperlipidemia 03/19/2019   Dyspnea on exertion 03/19/2019   Hypertension 02/2019    Chronic back pain 03/10/2018   Hypothyroidism 03/10/2018   Hyperlipidemia 04/2015   Right shoulder pain 05/12/2014   Impingement syndrome of right shoulder 05/12/2014   Have you seen any other providers since your last visit? No  Any changes in your medications or health? Yes, Patient stated she is having some muscle aches from her statin medication, she is currently taking this medication in the daytime.  Any side effects from any medications? Yes, Patient stated she is having muscle aches from statin medication.  Do you have an symptoms or problems not managed by your medications? No, Patient stated she is not currently having any issues.  Any concerns about your health right now? Yes, Patient stated her only concern is the muscle aches.  Has your provider asked that you check blood pressure, blood sugar, or follow special diet at home? No,   Do you get any type of exercise on a regular basis? {YES NO:22349}  Can you think of a goal you would like to reach for your health?   Do you have any problems getting your medications? {YES NO:22349}  Is there anything that you would like to discuss during the appointment?   Please bring medications and supplements to appointment    Follow-Up:  Pharmacist Review  Lucia Gaskins, CPP notified  Leilani Able, [facility] Clinical Pharmacist Assistant [phone]

## 2020-03-02 NOTE — Progress Notes (Signed)
Chronic Care Management Pharmacy Assistant   Name: Dana PihBelinda S [name]  MRN: [id number] DOB: 1945/03/12  Reason for Encounter: Initial visit questions for appointment on 03/07/2020    PCP : Blane Oharaox, Kirsten, MD  Allergies:   Allergies  Allergen Reactions   Penicillins Rash    Other reaction(s): RASH    Pravastatin Other (See Comments)    Myalgia    Medications: Outpatient Encounter Medications as of 03/02/2020  Medication Sig   ALPRAZolam (XANAX) 0.25 MG tablet TAKE 1 TABLET BY MOUTH DAILY AT BEDTIME AS NEEDED FOR SLEEP   atorvastatin (LIPITOR) 10 MG tablet TAKE 1 TABLET BY MOUTH ONCE DAILY   azithromycin (ZITHROMAX) 250 MG tablet Take two tablets by mouth day one Take one tablet by mouth days two-five   benzonatate (TESSALON) 100 MG capsule Take 1 capsule (100 mg total) by mouth 3 (three) times daily as needed for cough.   BYSTOLIC 20 MG TABS TAKE 1 TABLET BY MOUTH ONCE DAILY   fluticasone (FLONASE) 50 MCG/ACT nasal spray Place 2 sprays into both nostrils daily.   levothyroxine (SYNTHROID) 88 MCG tablet TAKE 1 TABLET BY MOUTH ONCE DAILY   loratadine (CLARITIN) 10 MG tablet Take 10 mg by mouth daily.   losartan (COZAAR) 25 MG tablet Take 1 tablet (25 mg total) by mouth daily.   meloxicam (MOBIC) 7.5 MG tablet Take 1 tablet (7.5 mg total) by mouth 2 (two) times daily as needed for pain.   PREMARIN 0.625 MG tablet Take 0.625 mg by mouth daily.   No facility-administered encounter medications on file as of 03/02/2020.    Current Diagnosis: Patient Active Problem List   Diagnosis Date Noted   History of ovarian cancer    Depression    Dyslipidemia 07/28/2019   Lumbar back pain 07/20/2019   Insomnia due to other mental disorder 07/19/2019   Anxiety disorder 07/19/2019   Sciatic nerve pain, right 07/19/2019   Sciatic nerve pain, left 07/19/2019   Essential hypertension 03/19/2019   Mixed hyperlipidemia 03/19/2019   Dyspnea on exertion 03/19/2019    Hypertension 02/2019   Chronic back pain 03/10/2018   Hypothyroidism 03/10/2018   Hyperlipidemia 04/2015   Right shoulder pain 05/12/2014   Impingement syndrome of right shoulder 05/12/2014   Have you seen any other providers since your last visit? No  Any changes in your medications or health? Yes, Patient stated she is having some muscle aches with her statin medication, she is currently taking it in the morning.  Any side effects from any medications? Yes, Patient stated she is having muscle aches with statin.  Do you have an symptoms or problems not managed by your medications? No, Patient stated she is not having any other problems.  Any concerns about your health right now? Yes, Patient stated her only concern is the muscle aches.  Has your provider asked that you check blood pressure, blood sugar, or follow special diet at home? No,Patient stated she is not currently checking her blood pressure, but is trying to eat healthy.   Do you get any type of exercise on a regular basis? Yes, Patient stated she tries to walk a couple times a week.  Can you think of a goal you would like to reach for your health? Patient stated she would to walk more.  Do you have any problems getting your medications? No, Patient stated she has not issue getting her medications.  Is there anything that you would like to discuss during the appointment? Patient wants  to discuss muscle aches.   Patient is aware to medications and supplements to appointment.    Follow-Up:  Pharmacist Review   Lucia Gaskins, CPP notified  Leilani Able, [facility] Clinical Pharmacist Assistant [phone]

## 2020-03-03 NOTE — Chronic Care Management (AMB) (Signed)
Chronic Care Management Pharmacy  Name: Dana [name]  MRN: [id number] DOB: 1946-01-23  Chief Complaint/ HPI  Marquette Old,  75 y.o. , female presents for their Initial CCM visit with the clinical pharmacist via telephone due to COVID-19 Pandemic.  PCP : Rochel Brome, MD   Plan Recommendations:   Patient is experiencing leg weakness and muscle pain. Feels as if she could fall due to leg weakness. States that her muscle pain and weakness is consistent with the response to her 2 previous attempts with a statin medication. We discussed options of reducing dose, alternative dosing of 2-3 times weekly of statin or taking CoQ10 daily. Patient would like to trial off of atorvastatin until next appointment and blood work in February to determine change in symptoms and lipid panel.    Their chronic conditions include: hypertension, hypothyroidism, chronic back pain, mixed hyperlipidemia, insomnia due to other mental disorder, anxiety, depression,   Office Visits: 02/09/2020 -  Flonase, azithromycin, benzonotate and calritin recommended.  11/05/2019 - Flu shot.  10/21/2019 - labs drawn in office. Follow up with Dr. Brien Few for lumbar back pain.  Consult Visit: 01/31/2020 - Cardiology - good control of cholesterol and blood pressure.  09/15/2019 - hematology and oncology - abnoraml mammogram. Ultrasound guided biopsy.  09/02/2019 - OBGYN Oncology - breast biopsy and blood work.  Medications: Outpatient Encounter Medications as of 03/07/2020  Medication Sig  . ALPRAZolam (XANAX) 0.25 MG tablet TAKE 1 TABLET BY MOUTH DAILY AT BEDTIME AS NEEDED FOR SLEEP  . atorvastatin (LIPITOR) 10 MG tablet TAKE 1 TABLET BY MOUTH ONCE DAILY  . BYSTOLIC 20 MG TABS TAKE 1 TABLET BY MOUTH ONCE DAILY  . levothyroxine (SYNTHROID) 88 MCG tablet TAKE 1 TABLET BY MOUTH ONCE DAILY  . losartan (COZAAR) 25 MG tablet Take 1 tablet (25 mg total) by mouth daily.  . meloxicam (MOBIC) 7.5 MG tablet Take 1 tablet (7.5 mg  total) by mouth 2 (two) times daily as needed for pain.  Marland Kitchen PREMARIN 0.625 MG tablet Take 0.625 mg by mouth daily.  Marland Kitchen azithromycin (ZITHROMAX) 250 MG tablet Take two tablets by mouth day one Take one tablet by mouth days two-five (Patient not taking: Reported on 03/07/2020)  . benzonatate (TESSALON) 100 MG capsule Take 1 capsule (100 mg total) by mouth 3 (three) times daily as needed for cough. (Patient not taking: Reported on 03/07/2020)  . fluticasone (FLONASE) 50 MCG/ACT nasal spray Place 2 sprays into both nostrils daily. (Patient not taking: Reported on 03/07/2020)  . loratadine (CLARITIN) 10 MG tablet Take 10 mg by mouth daily. (Patient not taking: Reported on 03/07/2020)   No facility-administered encounter medications on file as of 03/07/2020.   Allergies  Allergen Reactions  . Penicillins Rash    Other reaction(s): RASH   . Pravastatin Other (See Comments)    Myalgia   SDOH Screenings   Alcohol Screen: Not on file  Depression (PHQ2-9): Low Risk   . PHQ-2 Score: 0  Financial Resource Strain: Not on file  Food Insecurity: No Food Insecurity  . Worried About Charity fundraiser in the Last Year: Never true  . Ran Out of Food in the Last Year: Never true  Housing: Low Risk   . Last Housing Risk Score: 0  Physical Activity: Insufficiently Active  . Days of Exercise per Week: 1 day  . Minutes of Exercise per Session: 30 min  Social Connections: Not on file  Stress: Not on file  Tobacco Use: Low Risk   .  Smoking Tobacco Use: Never Smoker  . Smokeless Tobacco Use: Never Used  Transportation Needs: No Transportation Needs  . Lack of Transportation (Medical): No  . Lack of Transportation (Non-Medical): No     Current Diagnosis/Assessment:  Goals Addressed            This Visit's Progress   . Pharmacy Care Plan       CARE PLAN ENTRY (see longitudinal plan of care for additional care plan information)  Current Barriers:  . Chronic Disease Management support, education,  and care coordination needs related to Hypertension and Hyperlipidemia   Hypertension BP Readings from Last 3 Encounters:  02/09/20 140/70  01/31/20 124/82  10/21/19 104/72   . Pharmacist Clinical Goal(s): o Over the next 90 days, patient will work with PharmD and providers to maintain BP goal <130/80 . Current regimen:   Losartan 25 mg daily   Bystolic 20 mg daily  . Interventions: o Patient reports well controlled blood pressure.  o Reviewed medication regimen.  o Discussed diet and lifestyle.  o Encouraged patient to resume walking and yoga for exercise 3 times weekly as tolerated.  . Patient self care activities - Over the next 90 days, patient will: o Check BP as needed, document, and provide at future appointments o Ensure daily salt intake < 2300 mg/day  Hyperlipidemia Lab Results  Component Value Date/Time   LDLCALC 97 10/21/2019 09:40 AM   . Pharmacist Clinical Goal(s): o Over the next 90 days, patient will work with PharmD and providers to maintain LDL goal < 100 . Current regimen:  o atorvastatin 10 mg daily  . Interventions: o Medication regimen reviewed.  o Discussed patient's history of not tolerating 2 previous statin medications and leg weakness and muscle pain for the past few months similar to previous reaction to other statin medications.  o Discussed options of adding CoQ10 supplement daily, reducing dose, or alternate dose schedule.  . Patient self care activities - Over the next 90 days, patient will: o Patient request to trial off atorvastatin before next appointment with Dr. Tobie Poet to assess lipid panel response and muscle pain/weakness improvement.  Medication management . Pharmacist Clinical Goal(s): o Over the next 90 days, patient will work with PharmD and providers to maintain optimal medication adherence . Current pharmacy: [facility]  . Interventions o Comprehensive medication review performed. o Continue current medication management  strategy . Patient self care activities - Over the next 90 days, patient will: o Focus on medication adherence by using pill box.  o Take medications as prescribed o Report any questions or concerns to PharmD and/or provider(s)  Please see past updates related to this goal by clicking on the "Past Updates" button in the selected goal         Hypertension   BP today is:  <140/90  Office blood pressures are  BP Readings from Last 3 Encounters:  02/09/20 140/70  01/31/20 124/82  10/21/19 104/72    Patient has failed these meds in the past: none reported Patient is currently controlled on the following medications:   Losartan 25 mg daily   Bystolic 20 mg daily  Patient checks BP at home infrequently  Patient home BP readings are ranging: at goal when checking  We discussed diet and exercise extensively. Reviewed patient's exercise regimen. Patient reports that she has not been able to walk for exercise lately due to muscle pain and leg weakness (attributing to statin). Patient reports healthy diet. Typically oatmeal, eggs or cereal for  breakfast. Lunch is often fruit or yogurt with granola. Supper typically lean meat and vegetable. Patient eats fish often and blueberries daily.   Plan  Continue current medications    Hyperlipidemia   LDL goal < 100  Last lipids Lab Results  Component Value Date   CHOL 198 10/21/2019   HDL 88 10/21/2019   LDLCALC 97 10/21/2019   TRIG 75 10/21/2019   CHOLHDL 2.3 10/21/2019   Hepatic Function Latest Ref Rng & Units 10/21/2019 07/20/2019  Total Protein 6.0 - 8.5 g/dL 6.4 6.2  Albumin 3.7 - 4.7 g/dL 4.3 4.3  AST 0 - 40 IU/L 16 14  ALT 0 - 32 IU/L 12 9  Alk Phosphatase 48 - 121 IU/L 55 54  Total Bilirubin 0.0 - 1.2 mg/dL 0.4 0.5     The 10-year ASCVD risk score Mikey Bussing DC Jr., et al., 2013) is: 19.6%   Values used to calculate the score:     Age: 12 years     Sex: Female     Is Non-Hispanic African American: No     Diabetic: No      Tobacco smoker: No     Systolic Blood Pressure: 536 mmHg     Is BP treated: Yes     HDL Cholesterol: 88 mg/dL     Total Cholesterol: 198 mg/dL   Patient has failed these meds in past: pravastatin and one other statin  Patient is currently controlled on the following medications:  . atorvastatin 10 mg daily   We discussed:  diet and exercise extensively  Patient complaining of muscle aches with cholesterol medication. Has not tried COQ10. Would like to stop taking and evaluate symptom and blood work response with next lipid panel in February. Patient eats healthy and tries to exercise. Walking has been limited by muscle pain and weakness. Patient reports similar pain to previous statins.   Discussed options of cutting dose in half, adding CoQ10 daily or considering 2-3 times weekly dosing. Patient prefers a trial off of Lipitor to assess.   Plan  Consider trial off of atorvastatin to assess symptom improvement and lab results.   Hypothyroidism   Lab Results  Component Value Date/Time   TSH 3.220 07/20/2019 08:46 AM    Patient has failed these meds in past: none reported Patient is currently controlled on the following medications:  . levothyroxine 88 mcg daily   We discussed:  Patient takes medication each morning as prescribed. Denies symptoms at this time. States she has a stable "knot" on her thyroid that has been there for years. Previously had a needle biopsy.   Plan  Continue current medications   Anxiety   Patient has failed these meds in past: none reported Patient is currently controlled on the following medications:  . alprazolam 0.25 mg daily at bedtime as needed for sleep   We discussed:  Patient reports using this nightly to sleep. Denies getting up during the night for bathroom breaks. Reports that she sleeps well when taking medication otherwise can't rest. Reports dealing with anxiety since her teens and states symptoms are stable/well controlled at this  time. Discussed changes during COVID with lack of bridge club groups or volunteering which feels different. Denies concern at this time.   Plan  Continue current medications  Back pain    Patient has failed these meds in past: none reported Patient is currently controlled on the following medications:  . meloxicam 7.5 mg bid prn pain   We discussed:  Patient sees  Dr. Arnoldo Morale for back pain. Rarely uses meloxicam - last dose ~6 weeks ago. Patient has had 2 injections in her back which helped. Currently trying to delay a third injection at this time.   Patient has completed physical therapy in the past and has a yoga program that she does weekly. The yoga improves back pain and patient would like to increase to three times weekly to help improve back pain.   Plan  Continue current medications  Osteopenia / Osteoporosis   Last DEXA Scan: 07/2017   T-Score femoral neck: -0.2   T-Score lumbar spine: 0.9   No results found for: VD25OH   Patient is not a candidate for pharmacologic treatment  Patient has failed these meds in past: none repoted Patient is currently controlled on the following medications:  . Diet and lifestyle  We discussed:  Recommend [id number] units of vitamin D daily. Recommend 1200 mg of calcium daily from dietary and supplemental sources. Recommend weight-bearing and muscle strengthening exercises for building and maintaining bone density.  Plan  Continue control with diet and exercise  Health Maintenance   Patient is currently controlled on the following medications:  . Premarin 0.625 mg daily    We discussed:  Patient reports her doctor at [facility] wants her to remain on Premarin for life. Patient is tolerating well and denies concenrs.   Plan  Continue current medications  Vaccines   Reviewed and discussed patient's vaccination history.  Patient reports having all 3 COVID vaccines.   Immunization History  Administered Date(s) Administered   . Influenza Inj Mdck Quad Pf 11/05/2019  . Influenza-Unspecified 11/25/2017, 11/30/2018  . Pneumococcal Conjugate-13 05/13/2013, 05/30/2014  . Pneumococcal Polysaccharide-23 08/03/2011  . Tdap 03/11/2013  . Zoster Recombinat (Shingrix) 01/26/2018    Plan  Patient up to date on COVID and flu vaccine at this time.  Medication Management   Patient's preferred pharmacy is:  [place], [place] [address] Phone: [phone] Fax: [phone]  Uses pill box? Yes Pt endorses good compliance  We discussed: Discussed benefits of medication synchronization, packaging and delivery as well as enhanced pharmacist oversight with [facility].   Patient's insurance does not prefer [facility]. Reviewed medication cost with preferred and non-preferred pharmacies. Preferred pharmacies have $0 copay on generics. Non-preferred pharmacy has at least $3 every three months copay on generic medications. Premarin is the main cost difference with non-preferred pharmacy. Patient is aware and will let pharmacist know if she needs any questions answered.   Plan  Continue current medication management strategy    Follow up: will speak with patient about Dr. Alyse Low response to trial off of atorvastatin

## 2020-03-07 ENCOUNTER — Other Ambulatory Visit: Payer: Self-pay

## 2020-03-07 ENCOUNTER — Ambulatory Visit: Payer: Medicare Other

## 2020-03-07 DIAGNOSIS — I10 Essential (primary) hypertension: Secondary | ICD-10-CM

## 2020-03-07 DIAGNOSIS — E782 Mixed hyperlipidemia: Secondary | ICD-10-CM

## 2020-03-07 NOTE — Patient Instructions (Addendum)
Visit Information  Thank you for your time discussing your medications. I look forward to working with you to achieve your health care goals. Below is a summary of what we talked about during our visit.   Goals Addressed            This Visit's Progress   . Pharmacy Care Plan       CARE PLAN ENTRY (see longitudinal plan of care for additional care plan information)  Current Barriers:  . Chronic Disease Management support, education, and care coordination needs related to Hypertension and Hyperlipidemia   Hypertension BP Readings from Last 3 Encounters:  02/09/20 140/70  01/31/20 124/82  10/21/19 104/72   . Pharmacist Clinical Goal(s): o Over the next 90 days, patient will work with PharmD and providers to maintain BP goal <130/80 . Current regimen:   Losartan 25 mg daily   Bystolic 20 mg daily  . Interventions: o Patient reports well controlled blood pressure.  o Reviewed medication regimen.  o Discussed diet and lifestyle.  o Encouraged patient to resume walking and yoga for exercise 3 times weekly as tolerated.  . Patient self care activities - Over the next 90 days, patient will: o Check BP as needed, document, and provide at future appointments o Ensure daily salt intake < 2300 mg/day  Hyperlipidemia Lab Results  Component Value Date/Time   LDLCALC 97 10/21/2019 09:40 AM   . Pharmacist Clinical Goal(s): o Over the next 90 days, patient will work with PharmD and providers to maintain LDL goal < 100 . Current regimen:  o atorvastatin 10 mg daily  . Interventions: o Medication regimen reviewed.  o Discussed patient's history of not tolerating 2 previous statin medications and leg weakness and muscle pain for the past few months similar to previous reaction to other statin medications.  o Discussed options of adding CoQ10 supplement daily, reducing dose, or alternate dose schedule.  . Patient self care activities - Over the next 90 days, patient will: o Patient  request to trial off atorvastatin before next appointment with Dr. Tobie Poet to assess lipid panel response and muscle pain/weakness improvement.  Medication management . Pharmacist Clinical Goal(s): o Over the next 90 days, patient will work with PharmD and providers to maintain optimal medication adherence . Current pharmacy: [facility]  . Interventions o Comprehensive medication review performed. o Continue current medication management strategy . Patient self care activities - Over the next 90 days, patient will: o Focus on medication adherence by using pill box.  o Take medications as prescribed o Report any questions or concerns to PharmD and/or provider(s)  Please see past updates related to this goal by clicking on the "Past Updates" button in the selected goal         Dana [name] was given information about Chronic Care Management services today including:  1. CCM service includes personalized support from designated clinical staff supervised by her physician, including individualized plan of care and coordination with other care providers 2. 24/7 contact phone numbers for assistance for urgent and routine care needs. 3. Standard insurance, coinsurance, copays and deductibles apply for chronic care management only during months in which we provide at least 20 minutes of these services. Most insurances cover these services at 100%, however patients may be responsible for any copay, coinsurance and/or deductible if applicable. This service may help you avoid the need for more expensive face-to-face services. 4. Only one practitioner may furnish and bill the service in a calendar month. 5. The  patient may stop CCM services at any time (effective at the end of the month) by phone call to the office staff.  Patient agreed to services and verbal consent obtained.   The patient verbalized understanding of instructions, educational materials, and care plan provided today and declined  offer to receive copy of patient instructions, educational materials, and care plan.  Telephone follow up appointment with pharmacy team member scheduled for: follow-up once Dr. Tobie Poet approves cholesterol medication adjustment.   Sherre Poot, PharmD Clinical Pharmacist [facility] [phone] (office) [phone] (mobile)  Exercises to do While Sitting  Exercises that you do while sitting (chair exercises) can give you many of the same benefits as full exercise. Benefits include strengthening your heart, burning calories, and keeping muscles and joints healthy. Exercise can also improve your mood and help with depression and anxiety. You may benefit from chair exercises if you are unable to do standing exercises because of:  Diabetic foot pain.  Obesity.  Illness.  Arthritis.  Recovery from surgery or injury.  Breathing problems.  Balance problems.  Another type of disability. Before starting chair exercises, check with your health care provider or a physical therapist to find out how much exercise you can tolerate and which exercises are safe for you. If your health care provider approves:  Start out slowly and build up over time. Aim to work up to about 10-20 minutes for each exercise session.  Make exercise part of your daily routine.  Drink water when you exercise. Do not wait until you are thirsty. Drink every 10-15 minutes.  Stop exercising right away if you have pain, nausea, shortness of breath, or dizziness.  If you are exercising in a wheelchair, make sure to lock the wheels.  Ask your health care provider whether you can do tai chi or yoga. Many positions in these mind-body exercises can be modified to do while seated. Warm-up Before starting other exercises: 1. Sit up as straight as you can. Have your knees bent at 90 degrees, which is the shape of the capital letter "L." Keep your feet flat on the floor. 2. Sit at the front edge of your chair, if  you can. 3. Pull in (tighten) the muscles in your abdomen and stretch your spine and neck as straight as you can. Hold this position for a few minutes. 4. Breathe in and out evenly. Try to concentrate on your breathing, and relax your mind. Stretching Exercise A: Arm stretch 1. Hold your arms out straight in front of your body. 2. Bend your hands at the wrist with your fingers pointing up, as if signaling someone to stop. Notice the slight tension in your forearms as you hold the position. 3. Keeping your arms out and your hands bent, rotate your hands outward as far as you can and hold this stretch. Aim to have your thumbs pointing up and your pinkie fingers pointing down. Slowly repeat arm stretches for one minute as tolerated. Exercise B: Leg stretch 1. If you can move your legs, try to "draw" letters on the floor with the toes of your foot. Write your name with one foot. 2. Write your name with the toes of your other foot. Slowly repeat the movements for one minute as tolerated. Exercise C: Reach for the sky 1. Reach your hands as far over your head as you can to stretch your spine. 2. Move your hands and arms as if you are climbing a rope. Slowly repeat the movements for one  minute as tolerated. Range of motion exercises Exercise A: Shoulder roll 1. Let your arms hang loosely at your sides. 2. Lift just your shoulders up toward your ears, then let them relax back down. 3. When your shoulders feel loose, rotate your shoulders in backward and forward circles. Do shoulder rolls slowly for one minute as tolerated. Exercise B: March in place 1. As if you are marching, pump your arms and lift your legs up and down. Lift your knees as high as you can. ? If you are unable to lift your knees, just pump your arms and move your ankles and feet up and down. March in place for one minute as tolerated. Exercise C: Seated jumping jacks 1. Let your arms hang down straight. 2. Keeping your arms  straight, lift them up over your head. Aim to point your fingers to the ceiling. 3. While you lift your arms, straighten your legs and slide your heels along the floor to your sides, as wide as you can. 4. As you bring your arms back down to your sides, slide your legs back together. ? If you are unable to use your legs, just move your arms. Slowly repeat seated jumping jacks for one minute as tolerated. Strengthening exercises Exercise A: Shoulder squeeze 1. Hold your arms straight out from your body to your sides, with your elbows bent and your fists pointed at the ceiling. 2. Keeping your arms in the bent position, move them forward so your elbows and forearms meet in front of your face. 3. Open your arms back out as wide as you can with your elbows still bent, until you feel your shoulder blades squeezing together. Hold for 5 seconds. Slowly repeat the movements forward and backward for one minute as tolerated. Contact a health care provider if you:  Had to stop exercising due to any of the following: ? Pain. ? Nausea. ? Shortness of breath. ? Dizziness. ? Fatigue.  Have significant pain or soreness after exercising. Get help right away if you have:  Chest pain.  Difficulty breathing. These symptoms may represent a serious problem that is an emergency. Do not wait to see if the symptoms will go away. Get medical help right away. Call your local emergency services (911 in the [place] Do not drive yourself to the hospital. This information is not intended to replace advice given to you by your health care provider. Make sure you discuss any questions you have with your health care provider. Document Revised: 06/10/2019 Document Reviewed: 06/10/2019 Elsevier Patient Education  2021 [employer].

## 2020-03-13 ENCOUNTER — Telehealth: Payer: Self-pay

## 2020-03-13 NOTE — Progress Notes (Signed)
° ° °  Chronic Care Management Pharmacy Assistant   Name: Dana [name]  MRN: [id number] DOB: [birth year]  Reason for Encounter: Medication Review for reaction to Atorvastatin    PCP : Rochel Brome, MD  Allergies:   Allergies  Allergen Reactions   Penicillins Rash    Other reaction(s): RASH    Pravastatin Other (See Comments)    Myalgia    Medications: Outpatient Encounter Medications as of 03/13/2020  Medication Sig   ALPRAZolam (XANAX) 0.25 MG tablet TAKE 1 TABLET BY MOUTH DAILY AT BEDTIME AS NEEDED FOR SLEEP   atorvastatin (LIPITOR) 10 MG tablet TAKE 1 TABLET BY MOUTH ONCE DAILY   azithromycin (ZITHROMAX) 250 MG tablet Take two tablets by mouth day one Take one tablet by mouth days two-five (Patient not taking: Reported on 03/07/2020)   benzonatate (TESSALON) 100 MG capsule Take 1 capsule (100 mg total) by mouth 3 (three) times daily as needed for cough. (Patient not taking: Reported on 06/03/8117)   BYSTOLIC 20 MG TABS TAKE 1 TABLET BY MOUTH ONCE DAILY   fluticasone (FLONASE) 50 MCG/ACT nasal spray Place 2 sprays into both nostrils daily. (Patient not taking: Reported on 03/07/2020)   levothyroxine (SYNTHROID) 88 MCG tablet TAKE 1 TABLET BY MOUTH ONCE DAILY   loratadine (CLARITIN) 10 MG tablet Take 10 mg by mouth daily. (Patient not taking: Reported on 03/07/2020)   losartan (COZAAR) 25 MG tablet Take 1 tablet (25 mg total) by mouth daily.   meloxicam (MOBIC) 7.5 MG tablet Take 1 tablet (7.5 mg total) by mouth 2 (two) times daily as needed for pain.   PREMARIN 0.625 MG tablet Take 0.625 mg by mouth daily.   No facility-administered encounter medications on file as of 03/13/2020.    Current Diagnosis: Patient Active Problem List   Diagnosis Date Noted   History of ovarian cancer    Depression    Dyslipidemia 07/28/2019   Lumbar back pain 07/20/2019   Insomnia due to other mental disorder 07/19/2019   Anxiety disorder 07/19/2019   Sciatic nerve pain, right  07/19/2019   Sciatic nerve pain, left 07/19/2019   Essential hypertension 03/19/2019   Mixed hyperlipidemia 03/19/2019   Dyspnea on exertion 03/19/2019   Hypertension 02/2019   Chronic back pain 03/10/2018   Hypothyroidism 03/10/2018   Hyperlipidemia 04/2015   Right shoulder pain 05/12/2014   Impingement syndrome of right shoulder 05/12/2014   Per Donette Larry, CPP, she wanted me to call the patient and tell her to stop taking her stating due to muscle aches, Dr. Tobie Poet was in agreement with this decision.  I contacted the patient and she had already stopped the medication after speaking witg Emeterio Reeve, CPP earlier.   The patient stated she is feeling much better.  Follow-Up:  Pharmacist Review  Donette Larry, CPP notified  Clarita Leber, [place] Pharmacist Assistant [phone]

## 2020-03-20 DIAGNOSIS — R922 Inconclusive mammogram: Secondary | ICD-10-CM | POA: Diagnosis not present

## 2020-03-21 ENCOUNTER — Other Ambulatory Visit: Payer: Self-pay | Admitting: Cardiology

## 2020-03-22 NOTE — Telephone Encounter (Signed)
Rx refill sent to pharmacy. 

## 2020-04-20 NOTE — Progress Notes (Signed)
Subjective:  Patient ID: Dana [name], female    DOB: 1946-01-02  Age: 75 y.o. MRN: [id number]  Chief Complaint  Patient presents with  . Hypertension    HPI Essential hypertension      Controlled with bystolic and losartan BP increased to 187/70. Pt feels poorly when her bp goes up.   Mixed hyperlipidemia Intolerant to statins. Held lipitor 2-3 weeks ago due to leg weaknes and muscle pain. Side effects have improved.   Hypothyroidism due to acquired atrophy of thyroid Patient is taking synthroid  Anxiety disorder, unspecified type Takes xanax spariingly.  Current Outpatient Medications on File Prior to Visit  Medication Sig Dispense Refill  . ALPRAZolam (XANAX) 0.25 MG tablet TAKE 1 TABLET BY MOUTH DAILY AT BEDTIME AS NEEDED FOR SLEEP 30 tablet 3  . BYSTOLIC 20 MG TABS TAKE 1 TABLET BY MOUTH ONCE DAILY 90 tablet 2  . levothyroxine (SYNTHROID) 88 MCG tablet TAKE 1 TABLET BY MOUTH ONCE DAILY 90 tablet 1  . meloxicam (MOBIC) 7.5 MG tablet Take 1 tablet (7.5 mg total) by mouth 2 (two) times daily as needed for pain. 60 tablet 1  . PREMARIN 0.625 MG tablet Take 0.625 mg by mouth daily.     No current facility-administered medications on file prior to visit.   Past Medical History:  Diagnosis Date  . Anxiety disorder 07/19/2019  . Chronic back pain 03/10/2018  . Depression   . Dyslipidemia 07/28/2019  . Dyspnea on exertion 03/19/2019  . Essential hypertension 03/19/2019  . History of ovarian cancer   . Hyperlipidemia 04/2015  . Hypertension 02/2019  . Hypothyroidism 03/10/2018  . Impingement syndrome of right shoulder 05/12/2014  . Insomnia due to other mental disorder 07/19/2019  . Lumbar back pain 07/20/2019  . Mixed hyperlipidemia 03/19/2019  . Right shoulder pain 05/12/2014  . Sciatic nerve pain, left 07/19/2019  . Sciatic nerve pain, right 07/19/2019   Past Surgical History:  Procedure Laterality Date  . ABDOMINAL HYSTERECTOMY  1986   Total, also had chemo  .  APPENDECTOMY  1986  . LAPAROTOMY  1987   To rule out recurrent ovarian cancer  . TONSILLECTOMY      Family History  Problem Relation Age of Onset  . Congestive Heart Failure Mother   . Osteoarthritis Mother    Social History   Socioeconomic History  . Marital status: Married    Spouse name: Not on file  . Number of children: Not on file  . Years of education: Not on file  . Highest education level: Not on file  Occupational History  . Not on file  Tobacco Use  . Smoking status: Never Smoker  . Smokeless tobacco: Never Used  Substance and Sexual Activity  . Alcohol use: Yes    Comment: occasionally  . Drug use: Yes    Types: Solvent inhalants  . Sexual activity: Not on file  Other Topics Concern  . Not on file  Social History Narrative  . Not on file   Social Determinants of Health   Financial Resource Strain: Not on file  Food Insecurity: No Food Insecurity  . Worried About Charity fundraiser in the Last Year: Never true  . Ran Out of Food in the Last Year: Never true  Transportation Needs: No Transportation Needs  . Lack of Transportation (Medical): No  . Lack of Transportation (Non-Medical): No  Physical Activity: Insufficiently Active  . Days of Exercise per Week: 1 day  . Minutes of Exercise per  Session: 30 min  Stress: Not on file  Social Connections: Not on file    Review of Systems  Constitutional: Negative for chills, fatigue and fever.  HENT: Negative for congestion, ear pain, rhinorrhea and sore throat.   Respiratory: Negative for cough and shortness of breath.   Cardiovascular: Negative for chest pain.  Gastrointestinal: Positive for constipation ("rabbit pellets" Eating granola and this helps. Pt drinks water.). Negative for abdominal pain, diarrhea, nausea and vomiting.  Genitourinary: Negative for dysuria and urgency.  Musculoskeletal: Positive for back pain. Negative for myalgias.  Neurological: Negative for dizziness, weakness,  light-headedness and headaches.  Psychiatric/Behavioral: Negative for dysphoric mood. The patient is not nervous/anxious.      Objective:  BP (!) 146/78   Pulse 78   Temp (!) 96.9 F (36.1 C)   Resp 16   Ht 5\' 3"  (1.6 m)   Wt 133 lb (60.3 kg)   BMI 23.56 kg/m   BP/Weight 04/21/2020 02/09/2020 26/08/1243  Systolic BP 809 983 382  Diastolic BP 78 70 82  Wt. (Lbs) 133 - 134  BMI 23.56 - 23.74    Physical Exam Vitals reviewed.  Constitutional:      Appearance: Normal appearance. She is normal weight.  Neck:     Vascular: No carotid bruit.  Cardiovascular:     Rate and Rhythm: Normal rate and regular rhythm.     Heart sounds: Normal heart sounds.  Pulmonary:     Effort: Pulmonary effort is normal. No respiratory distress.     Breath sounds: Normal breath sounds.  Abdominal:     General: Abdomen is flat. Bowel sounds are normal.     Palpations: Abdomen is soft.     Tenderness: There is no abdominal tenderness.  Neurological:     Mental Status: She is alert and oriented to person, place, and time.  Psychiatric:        Mood and Affect: Mood normal.        Behavior: Behavior normal.     Diabetic Foot Exam - Simple   No data filed      Lab Results  Component Value Date   WBC 6.2 10/21/2019   HGB 14.5 10/21/2019   HCT 41.7 10/21/2019   PLT 335 10/21/2019   GLUCOSE 90 10/21/2019   CHOL 198 10/21/2019   TRIG 75 10/21/2019   HDL 88 10/21/2019   LDLCALC 97 10/21/2019   ALT 12 10/21/2019   AST 16 10/21/2019   NA 140 10/21/2019   K 4.9 10/21/2019   CL 101 10/21/2019   CREATININE 0.74 10/21/2019   BUN 14 10/21/2019   CO2 24 10/21/2019   TSH 3.220 07/20/2019   HGBA1C 5.7 (H) 07/20/2019      Assessment & Plan:   1. Essential hypertension Not at goal. Check bp 1-2 times daily.  Increase losartan 25 mg one twice a day. - Comprehensive metabolic panel  2. Mixed hyperlipidemia Remain off lipitor. - Lipid panel - CBC with Differential/Platelet  3.  Hypothyroidism due to acquired atrophy of thyroid - TSH  4. Anxiety disorder, unspecified type  On xanax  5. Constipation:  Add fiber to diet and increase water intake.   Meds ordered this encounter  Medications  . losartan (COZAAR) 25 MG tablet    Sig: Take 1 tablet (25 mg total) by mouth in the morning and at bedtime.    Dispense:  180 tablet    Refill:  0    Orders Placed This Encounter  Procedures  .  Lipid panel  . Comprehensive metabolic panel  . CBC with Differential/Platelet  . TSH     Follow-up: Return in about 3 months (around 07/19/2020) for fasting.Marland Kitchen  An After Visit Summary was printed and given to the patient.  Rochel Brome, MD [facility] [phone]

## 2020-04-21 ENCOUNTER — Ambulatory Visit (INDEPENDENT_AMBULATORY_CARE_PROVIDER_SITE_OTHER): Payer: Medicare Other | Admitting: Family Medicine

## 2020-04-21 ENCOUNTER — Other Ambulatory Visit: Payer: Self-pay

## 2020-04-21 VITALS — BP 146/78 | HR 78 | Temp 96.9°F | Resp 16 | Ht 63.0 in | Wt 133.0 lb

## 2020-04-21 DIAGNOSIS — E782 Mixed hyperlipidemia: Secondary | ICD-10-CM | POA: Diagnosis not present

## 2020-04-21 DIAGNOSIS — F419 Anxiety disorder, unspecified: Secondary | ICD-10-CM

## 2020-04-21 DIAGNOSIS — E034 Atrophy of thyroid (acquired): Secondary | ICD-10-CM

## 2020-04-21 DIAGNOSIS — I10 Essential (primary) hypertension: Secondary | ICD-10-CM | POA: Diagnosis not present

## 2020-04-21 MED ORDER — LOSARTAN POTASSIUM 25 MG PO TABS: 25.0000 mg | ORAL_TABLET | Freq: Two times a day (BID) | ORAL | 0 refills | Status: DC

## 2020-04-21 NOTE — Patient Instructions (Addendum)
Increase losartan 25 mg one twice a day. Remain off lipitor.  Add fiber to diet and increase water intake.    Constipation, Adult Constipation is when a person has trouble pooping (having a bowel movement). When you have this condition, you may poop fewer than 3 times a week. Your poop (stool) may also be dry, hard, or bigger than normal. Follow these instructions at home: Eating and drinking  Eat foods that have a lot of fiber, such as: ? Fresh fruits and vegetables. ? Whole grains. ? Beans.  Eat less of foods that are low in fiber and high in fat and sugar, such as: ? [place] fries. ? Hamburgers. ? Cookies. ? Candy. ? Soda.  Drink enough fluid to keep your pee (urine) pale yellow.   General instructions  Exercise regularly or as told by your doctor. Try to do 150 minutes of exercise each week.  Go to the restroom when you feel like you need to poop. Do not hold it in.  Take over-the-counter and prescription medicines only as told by your doctor. These include any fiber supplements.  When you poop: ? Do deep breathing while relaxing your lower belly (abdomen). ? Relax your pelvic floor. The pelvic floor is a group of muscles that support the rectum, bladder, and intestines (as well as the uterus in women).  Watch your condition for any changes. Tell your doctor if you notice any.  Keep all follow-up visits as told by your doctor. This is important. Contact a doctor if:  You have pain that gets worse.  You have a fever.  You have not pooped for 4 days.  You vomit.  You are not hungry.  You lose weight.  You are bleeding from the opening of the butt (anus).  You have thin, pencil-like poop. Get help right away if:  You have a fever, and your symptoms suddenly get worse.  You leak poop or have blood in your poop.  Your belly feels hard or bigger than normal (bloated).  You have very bad belly pain.  You feel dizzy or you faint. Summary  Constipation is  when a person poops fewer than 3 times a week, has trouble pooping, or has poop that is dry, hard, or bigger than normal.  Eat foods that have a lot of fiber.  Drink enough fluid to keep your pee (urine) pale yellow.  Take over-the-counter and prescription medicines only as told by your doctor. These include any fiber supplements. This information is not intended to replace advice given to you by your health care provider. Make sure you discuss any questions you have with your health care provider. Document Revised: 12/30/2018 Document Reviewed: 12/30/2018 Elsevier Patient Education  2021 [place] Prevention in the Home, Adult Falls can cause injuries and can happen to people of all ages. There are many things you can do to make your home safe and to help prevent falls. Ask for help when making these changes. What actions can I take to prevent falls? General Instructions  Use good lighting in all rooms. Replace any light bulbs that burn out.  Turn on the lights in dark areas. Use night-lights.  Keep items that you use often in easy-to-reach places. Lower the shelves around your home if needed.  Set up your furniture so you have a clear path. Avoid moving your furniture around.  Do not have throw rugs or other things on the floor that can make you trip.  Avoid walking on  wet floors.  If any of your floors are uneven, fix them.  Add color or contrast paint or tape to clearly mark and help you see: ? Grab bars or handrails. ? First and last steps of staircases. ? Where the edge of each step is.  If you use a stepladder: ? Make sure that it is fully opened. Do not climb a closed stepladder. ? Make sure the sides of the stepladder are locked in place. ? Ask someone to hold the stepladder while you use it.  Know where your pets are when moving through your home. What can I do in the bathroom?  Keep the floor dry. Clean up any water on the floor right away.  Remove  soap buildup in the tub or shower.  Use nonskid mats or decals on the floor of the tub or shower.  Attach bath mats securely with double-sided, nonslip rug tape.  If you need to sit down in the shower, use a plastic, nonslip stool.  Install grab bars by the toilet and in the tub and shower. Do not use towel bars as grab bars.      What can I do in the bedroom?  Make sure that you have a light by your bed that is easy to reach.  Do not use any sheets or blankets for your bed that hang to the floor.  Have a firm chair with side arms that you can use for support when you get dressed. What can I do in the kitchen?  Clean up any spills right away.  If you need to reach something above you, use a step stool with a grab bar.  Keep electrical cords out of the way.  Do not use floor polish or wax that makes floors slippery. What can I do with my stairs?  Do not leave any items on the stairs.  Make sure that you have a light switch at the top and the bottom of the stairs.  Make sure that there are handrails on both sides of the stairs. Fix handrails that are broken or loose.  Install nonslip stair treads on all your stairs.  Avoid having throw rugs at the top or bottom of the stairs.  Choose a carpet that does not hide the edge of the steps on the stairs.  Check carpeting to make sure that it is firmly attached to the stairs. Fix carpet that is loose or worn. What can I do on the outside of my home?  Use bright outdoor lighting.  Fix the edges of walkways and driveways and fix any cracks.  Remove anything that might make you trip as you walk through a door, such as a raised step or threshold.  Trim any bushes or trees on paths to your home.  Check to see if handrails are loose or broken and that both sides of all steps have handrails.  Install guardrails along the edges of any raised decks and porches.  Clear paths of anything that can make you trip, such as tools or  rocks.  Have leaves, snow, or ice cleared regularly.  Use sand or salt on paths during winter.  Clean up any spills in your garage right away. This includes grease or oil spills. What other actions can I take?  Wear shoes that: ? Have a low heel. Do not wear high heels. ? Have rubber bottoms. ? Feel good on your feet and fit well. ? Are closed at the toe. Do not wear open-toe  sandals.  Use tools that help you move around if needed. These include: ? Canes. ? Walkers. ? Scooters. ? Crutches.  Review your medicines with your doctor. Some medicines can make you feel dizzy. This can increase your chance of falling. Ask your doctor what else you can do to help prevent falls. Where to find more information  Centers for Disease Control and Prevention, STEADI: http://www.wolf.info/  National Institute on Aging: http://kim-miller.com/ Contact a doctor if:  You are afraid of falling at home.  You feel weak, drowsy, or dizzy at home.  You fall at home. Summary  There are many simple things that you can do to make your home safe and to help prevent falls.  Ways to make your home safe include removing things that can make you trip and installing grab bars in the bathroom.  Ask for help when making these changes in your home. This information is not intended to replace advice given to you by your health care provider. Make sure you discuss any questions you have with your health care provider. Document Revised: 09/15/2019 Document Reviewed: 09/15/2019 Elsevier Patient Education  [place].

## 2020-04-22 LAB — CBC WITH DIFFERENTIAL/PLATELET
Basophils Absolute: 0 10*3/uL (ref 0.0–0.2)
Basos: 1 %
EOS (ABSOLUTE): 0.1 10*3/uL (ref 0.0–0.4)
Eos: 2 %
Hematocrit: 41.1 % (ref 34.0–46.6)
Hemoglobin: 13.5 g/dL (ref 11.1–15.9)
Immature Grans (Abs): 0 10*3/uL (ref 0.0–0.1)
Immature Granulocytes: 0 %
Lymphocytes Absolute: 1.5 10*3/uL (ref 0.7–3.1)
Lymphs: 26 %
MCH: 31.5 pg (ref 26.6–33.0)
MCHC: 32.8 g/dL (ref 31.5–35.7)
MCV: 96 fL (ref 79–97)
Monocytes Absolute: 0.5 10*3/uL (ref 0.1–0.9)
Monocytes: 9 %
Neutrophils Absolute: 3.8 10*3/uL (ref 1.4–7.0)
Neutrophils: 62 %
Platelets: 318 10*3/uL (ref 150–450)
RBC: 4.28 x10E6/uL (ref 3.77–5.28)
RDW: 13.3 % (ref 11.7–15.4)
WBC: 5.9 10*3/uL (ref 3.4–10.8)

## 2020-04-22 LAB — LIPID PANEL
Chol/HDL Ratio: 2.7 ratio (ref 0.0–4.4)
Cholesterol, Total: 249 mg/dL — ABNORMAL HIGH (ref 100–199)
HDL: 91 mg/dL (ref >39)
LDL Chol Calc (NIH): 147 mg/dL — ABNORMAL HIGH (ref 0–99)
Triglycerides: 67 mg/dL (ref 0–149)
VLDL Cholesterol Cal: 11 mg/dL (ref 5–40)

## 2020-04-22 LAB — COMPREHENSIVE METABOLIC PANEL
ALT: 7 IU/L (ref 0–32)
AST: 15 IU/L (ref 0–40)
Albumin/Globulin Ratio: 2.2 (ref 1.2–2.2)
Albumin: 4.1 g/dL (ref 3.7–4.7)
Alkaline Phosphatase: 55 IU/L (ref 44–121)
BUN/Creatinine Ratio: 27 (ref 12–28)
BUN: 19 mg/dL (ref 8–27)
Bilirubin Total: 0.4 mg/dL (ref 0.0–1.2)
CO2: 22 mmol/L (ref 20–29)
Calcium: 9.4 mg/dL (ref 8.7–10.3)
Chloride: 103 mmol/L (ref 96–106)
Creatinine, Ser: 0.7 mg/dL (ref 0.57–1.00)
GFR calc Af Amer: 99 mL/min/{1.73_m2} (ref >59)
GFR calc non Af Amer: 86 mL/min/{1.73_m2} (ref >59)
Globulin, Total: 1.9 g/dL (ref 1.5–4.5)
Glucose: 103 mg/dL — ABNORMAL HIGH (ref 65–99)
Potassium: 4.8 mmol/L (ref 3.5–5.2)
Sodium: 139 mmol/L (ref 134–144)
Total Protein: 6 g/dL (ref 6.0–8.5)

## 2020-04-22 LAB — TSH: TSH: 3.17 u[IU]/mL (ref 0.450–4.500)

## 2020-04-22 LAB — CARDIOVASCULAR RISK ASSESSMENT

## 2020-04-24 ENCOUNTER — Encounter: Payer: Self-pay | Admitting: Family Medicine

## 2020-04-25 LAB — HGB A1C W/O EAG: Hgb A1c MFr Bld: 5.6 % (ref 4.8–5.6)

## 2020-04-25 LAB — SPECIMEN STATUS REPORT

## 2020-06-07 DIAGNOSIS — L821 Other seborrheic keratosis: Secondary | ICD-10-CM | POA: Diagnosis not present

## 2020-06-07 DIAGNOSIS — D225 Melanocytic nevi of trunk: Secondary | ICD-10-CM | POA: Diagnosis not present

## 2020-06-07 DIAGNOSIS — D3612 Benign neoplasm of peripheral nerves and autonomic nervous system, upper limb, including shoulder: Secondary | ICD-10-CM | POA: Diagnosis not present

## 2020-06-07 DIAGNOSIS — D1801 Hemangioma of skin and subcutaneous tissue: Secondary | ICD-10-CM | POA: Diagnosis not present

## 2020-06-26 ENCOUNTER — Other Ambulatory Visit: Payer: Self-pay | Admitting: Legal Medicine

## 2020-06-26 NOTE — Telephone Encounter (Signed)
Sorry , I think this is your or shannon"s lp

## 2020-07-03 ENCOUNTER — Telehealth: Payer: Self-pay

## 2020-07-03 NOTE — Progress Notes (Signed)
    Chronic Care Management Pharmacy Assistant   Name: Dana [name]  MRN: [id number] DOB: 12/18/1945  Reason for Encounter: Disease State for General Adherence  Recent office visits:  04/21/20-Dr. Tobie Poet PCP, HTN, Labs, Losartan 25mg  increased to 2x day. Atorvastatin stopped, follow up 50mos, increase water/fiber for constipation.  A1c great. Prediabetes well controlled.Lipids: LDL elevated. Worsened from 97 to 147. Liver and kidney function normal. Blood count normal. Tsh normal. Glucose 103. Add A1C.   03/20/20-[facility] for mammogram  Recent consult visits:  none  Hospital visits:  03/20/20-[facility] for mammogram  Medications: Outpatient Encounter Medications as of 07/03/2020  Medication Sig  . ALPRAZolam (XANAX) 0.25 MG tablet TAKE 1 TABLET BY MOUTH DAILY AT BEDTIME AS NEEDED FOR SLEEP  . BYSTOLIC 20 MG TABS TAKE 1 TABLET BY MOUTH ONCE DAILY  . levothyroxine (SYNTHROID) 88 MCG tablet TAKE 1 TABLET BY MOUTH ONCE DAILY  . losartan (COZAAR) 25 MG tablet Take 1 tablet (25 mg total) by mouth in the morning and at bedtime.  . meloxicam (MOBIC) 7.5 MG tablet Take 1 tablet (7.5 mg total) by mouth 2 (two) times daily as needed for pain.  Mar[place] Kitchen PREMARIN 0.625 MG tablet Take 0.625 mg by mouth daily.   No facility-administered encounter medications on file as of 07/03/2020.   Spoke to patient she is doing well.  She stated she has just gotten back from vacation at the  [place], she stated her diet was not as closely while on vacation.  Patient stated she is very active.    Patient stated she takes her blood pressure occasionally, it usually stays around 140/80.   She stated she can tell when it is elevated, she said she feels different.  Patient denied any issues with her medications.    She will notify us of any changes.   Next appointment with Dr. Tobie Poet is 08/02/20  [facility] Losartan 04/21/20 [place], [place] Pharmacist Assistant [phone]

## 2020-07-05 ENCOUNTER — Other Ambulatory Visit: Payer: Self-pay | Admitting: Cardiology

## 2020-07-14 ENCOUNTER — Other Ambulatory Visit: Payer: Self-pay | Admitting: Family Medicine

## 2020-07-26 ENCOUNTER — Other Ambulatory Visit: Payer: Self-pay | Admitting: Family Medicine

## 2020-07-26 ENCOUNTER — Ambulatory Visit: Payer: Medicare Other | Admitting: Family Medicine

## 2020-07-26 DIAGNOSIS — I10 Essential (primary) hypertension: Secondary | ICD-10-CM

## 2020-08-01 ENCOUNTER — Ambulatory Visit: Payer: Medicare Other

## 2020-08-01 NOTE — Progress Notes (Signed)
Subjective:  Patient ID: Dana [name], female    DOB: [birth year]  Age: 75 y.o. MRN: [id number]  Chief Complaint  Patient presents with   Hyperlipidemia   Hypertension    HPI Essential hypertension Losartan 25 mg daily and nebivolol 20 mg daily BP 120-140/76 Mixed hyperlipidemia Eats healthy and exercising.  Intolerant to statin.  Hypothyroidism due to acquired atrophy of thyroid Synthroid 88 mcg daily Anxiety disorder, unspecified type Xanax 0.25 mg daily   Current Outpatient Medications on File Prior to Visit  Medication Sig Dispense Refill   ALPRAZolam (XANAX) 0.25 MG tablet TAKE 1 TABLET BY MOUTH DAILY AT BEDTIME AS NEEDED FOR SLEEP 30 tablet 2   levothyroxine (SYNTHROID) 88 MCG tablet TAKE 1 TABLET BY MOUTH ONCE DAILY 90 tablet 0   losartan (COZAAR) 25 MG tablet TAKE 1 TABLET BY MOUTH IN THE MORNING AND AT BEDTIME 180 tablet 0   meloxicam (MOBIC) 7.5 MG tablet Take 1 tablet (7.5 mg total) by mouth 2 (two) times daily as needed for pain. 60 tablet 1   Nebivolol HCl 20 MG TABS TAKE 1 TABLET BY MOUTH ONCE DAILY 90 tablet 1   PREMARIN 0.625 MG tablet Take 0.625 mg by mouth daily.     No current facility-administered medications on file prior to visit.   Past Medical History:  Diagnosis Date   Anxiety disorder 07/19/2019   Chronic back pain 03/10/2018   Depression    Dyslipidemia 07/28/2019   Dyspnea on exertion 03/19/2019   Essential hypertension 03/19/2019   History of ovarian cancer    Hyperlipidemia 04/2015   Hypertension 02/2019   Hypothyroidism 03/10/2018   Impingement syndrome of right shoulder 05/12/2014   Insomnia due to other mental disorder 07/19/2019   Lumbar back pain 07/20/2019   Mixed hyperlipidemia 03/19/2019   Right shoulder pain 05/12/2014   Sciatic nerve pain, left 07/19/2019   Sciatic nerve pain, right 07/19/2019   Past Surgical History:  Procedure Laterality Date   ABDOMINAL HYSTERECTOMY  1986   Total, also had chemo   [place]   To rule out recurrent ovarian cancer   TONSILLECTOMY      Family History  Problem Relation Age of Onset   Congestive Heart Failure Mother    Osteoarthritis Mother    Social History   Socioeconomic History   Marital status: Married    Spouse name: Not on file   Number of children: Not on file   Years of education: Not on file   Highest education level: Not on file  Occupational History   Not on file  Tobacco Use   Smoking status: Never   Smokeless tobacco: Never  Substance and Sexual Activity   Alcohol use: Yes    Comment: occasionally   Drug use: Yes    Types: Solvent inhalants   Sexual activity: Not on file  Other Topics Concern   Not on file  Social History Narrative   Not on file   Social Determinants of Health   Financial Resource Strain: Not on file  Food Insecurity: No Food Insecurity   Worried About Running Out of Food in the Last Year: Never true   Ran Out of Food in the Last Year: Never true  Transportation Needs: No Transportation Needs   Lack of Transportation (Medical): No   Lack of Transportation (Non-Medical): No  Physical Activity: Insufficiently Active   Days of Exercise per Week: 1 day   Minutes of Exercise per Session:  30 min  Stress: Not on file  Social Connections: Not on file    Review of Systems  Constitutional:  Negative for chills, fatigue and fever.  HENT:  Negative for congestion, ear pain, rhinorrhea and sore throat.   Respiratory:  Negative for cough and shortness of breath.   Cardiovascular:  Negative for chest pain.  Gastrointestinal:  Negative for abdominal pain, constipation, diarrhea, nausea and vomiting.  Genitourinary:  Negative for dysuria and urgency.  Musculoskeletal:  Positive for back pain. Negative for myalgias.  Neurological:  Negative for dizziness, weakness, light-headedness and headaches.  Psychiatric/Behavioral:  Negative for dysphoric mood. The patient is not nervous/anxious.     Objective:   BP 122/68   Pulse (!) 58   Temp (!) 97.1 F (36.2 C)   Ht 5\' 3"  (1.6 m)   Wt 133 lb (60.3 kg)   SpO2 94%   BMI 23.56 kg/m   BP/Weight 08/02/2020 04/21/2020 61/95/0932  Systolic BP 671 245 809  Diastolic BP 68 78 70  Wt. (Lbs) 133 133 -  BMI 23.56 23.56 -    Physical Exam Vitals reviewed.  Constitutional:      Appearance: Normal appearance. She is normal weight.  Neck:     Vascular: No carotid bruit.  Cardiovascular:     Rate and Rhythm: Normal rate and regular rhythm.     Pulses: Normal pulses.     Heart sounds: Normal heart sounds.  Pulmonary:     Effort: Pulmonary effort is normal. No respiratory distress.     Breath sounds: Normal breath sounds.  Abdominal:     General: Abdomen is flat. Bowel sounds are normal.     Palpations: Abdomen is soft.     Tenderness: There is no abdominal tenderness.  Neurological:     Mental Status: She is alert and oriented to person, place, and time.  Psychiatric:        Mood and Affect: Mood normal.        Behavior: Behavior normal.    Diabetic Foot Exam - Simple   No data filed      Lab Results  Component Value Date   WBC 6.2 08/02/2020   HGB 14.1 08/02/2020   HCT 41.7 08/02/2020   PLT 327 08/02/2020   GLUCOSE 111 (H) 08/02/2020   CHOL 254 (H) 08/02/2020   TRIG 90 08/02/2020   HDL 95 08/02/2020   LDLCALC 144 (H) 08/02/2020   ALT 7 08/02/2020   AST 9 08/02/2020   NA 141 08/02/2020   K 4.8 08/02/2020   CL 103 08/02/2020   CREATININE 0.77 08/02/2020   BUN 14 08/02/2020   CO2 26 08/02/2020   TSH 3.170 04/21/2020   HGBA1C 5.6 04/21/2020      Assessment & Plan:   1. Essential hypertension Well controlled.  No changes to medicines.  Continue to work on eating a healthy diet and exercise.  Labs drawn today.  - Comprehensive metabolic panel  2. Mixed hyperlipidemia Await labs to make recommendations.  Recommend continue to work on eating healthy diet and exercise. - CBC with Differential/Platelet - Lipid  panel  3. Hypothyroidism due to acquired atrophy of thyroid The current medical regimen is effective;  continue present plan and medications.  4. Anxiety disorder, unspecified type  Continue xanax.   Meds ordered this encounter  Medications   hydrochlorothiazide (HYDRODIURIL) 25 MG tablet    Sig: Take 1 tablet (25 mg total) by mouth daily.    Dispense:  30 tablet  Refill:  2    Orders Placed This Encounter  Procedures   CBC with Differential/Platelet   Comprehensive metabolic panel   Lipid panel   Cardiovascular Risk Assessment     Follow-up: Return in about 3 months (around 11/02/2020) for fasting.  An After Visit Summary was printed and given to the patient.  Rochel Brome, MD [facility] [phone]

## 2020-08-02 ENCOUNTER — Other Ambulatory Visit: Payer: Self-pay

## 2020-08-02 ENCOUNTER — Ambulatory Visit (INDEPENDENT_AMBULATORY_CARE_PROVIDER_SITE_OTHER): Payer: Medicare Other | Admitting: Family Medicine

## 2020-08-02 VITALS — BP 122/68 | HR 58 | Temp 97.1°F | Ht 63.0 in | Wt 133.0 lb

## 2020-08-02 DIAGNOSIS — I10 Essential (primary) hypertension: Secondary | ICD-10-CM

## 2020-08-02 DIAGNOSIS — E782 Mixed hyperlipidemia: Secondary | ICD-10-CM

## 2020-08-02 DIAGNOSIS — E034 Atrophy of thyroid (acquired): Secondary | ICD-10-CM

## 2020-08-02 DIAGNOSIS — F419 Anxiety disorder, unspecified: Secondary | ICD-10-CM | POA: Diagnosis not present

## 2020-08-02 MED ORDER — HYDROCHLOROTHIAZIDE 25 MG PO TABS: 25.0000 mg | ORAL_TABLET | Freq: Every day | ORAL | 2 refills | Status: DC

## 2020-08-02 NOTE — Patient Instructions (Signed)
Hydrochlorothiazide 25 mg once daily.

## 2020-08-03 ENCOUNTER — Other Ambulatory Visit: Payer: Self-pay

## 2020-08-03 LAB — CBC WITH DIFFERENTIAL/PLATELET
Basophils Absolute: 0 10*3/uL (ref 0.0–0.2)
Basos: 1 %
EOS (ABSOLUTE): 0.1 10*3/uL (ref 0.0–0.4)
Eos: 2 %
Hematocrit: 41.7 % (ref 34.0–46.6)
Hemoglobin: 14.1 g/dL (ref 11.1–15.9)
Immature Grans (Abs): 0 10*3/uL (ref 0.0–0.1)
Immature Granulocytes: 0 %
Lymphocytes Absolute: 1.8 10*3/uL (ref 0.7–3.1)
Lymphs: 29 %
MCH: 31.8 pg (ref 26.6–33.0)
MCHC: 33.8 g/dL (ref 31.5–35.7)
MCV: 94 fL (ref 79–97)
Monocytes Absolute: 0.5 10*3/uL (ref 0.1–0.9)
Monocytes: 8 %
Neutrophils Absolute: 3.7 10*3/uL (ref 1.4–7.0)
Neutrophils: 60 %
Platelets: 327 10*3/uL (ref 150–450)
RBC: 4.43 x10E6/uL (ref 3.77–5.28)
RDW: 12.6 % (ref 11.7–15.4)
WBC: 6.2 10*3/uL (ref 3.4–10.8)

## 2020-08-03 LAB — LIPID PANEL
Chol/HDL Ratio: 2.7 ratio (ref 0.0–4.4)
Cholesterol, Total: 254 mg/dL — ABNORMAL HIGH (ref 100–199)
HDL: 95 mg/dL (ref >39)
LDL Chol Calc (NIH): 144 mg/dL — ABNORMAL HIGH (ref 0–99)
Triglycerides: 90 mg/dL (ref 0–149)
VLDL Cholesterol Cal: 15 mg/dL (ref 5–40)

## 2020-08-03 LAB — COMPREHENSIVE METABOLIC PANEL
ALT: 7 IU/L (ref 0–32)
AST: 9 IU/L (ref 0–40)
Albumin/Globulin Ratio: 2.1 (ref 1.2–2.2)
Albumin: 4.2 g/dL (ref 3.7–4.7)
Alkaline Phosphatase: 56 IU/L (ref 44–121)
BUN/Creatinine Ratio: 18 (ref 12–28)
BUN: 14 mg/dL (ref 8–27)
Bilirubin Total: 0.5 mg/dL (ref 0.0–1.2)
CO2: 26 mmol/L (ref 20–29)
Calcium: 9.7 mg/dL (ref 8.7–10.3)
Chloride: 103 mmol/L (ref 96–106)
Creatinine, Ser: 0.77 mg/dL (ref 0.57–1.00)
Globulin, Total: 2 g/dL (ref 1.5–4.5)
Glucose: 111 mg/dL — ABNORMAL HIGH (ref 65–99)
Potassium: 4.8 mmol/L (ref 3.5–5.2)
Sodium: 141 mmol/L (ref 134–144)
Total Protein: 6.2 g/dL (ref 6.0–8.5)
eGFR: 81 mL/min/{1.73_m2} (ref >59)

## 2020-08-03 LAB — CARDIOVASCULAR RISK ASSESSMENT

## 2020-08-03 MED ORDER — EZETIMIBE 10 MG PO TABS: 10.0000 mg | ORAL_TABLET | Freq: Every day | ORAL | 0 refills | Status: DC

## 2020-08-03 NOTE — Progress Notes (Signed)
Left message to call the office.

## 2020-08-03 NOTE — Progress Notes (Signed)
Left message to call our office

## 2020-08-05 ENCOUNTER — Encounter: Payer: Self-pay | Admitting: Family Medicine

## 2020-08-17 DIAGNOSIS — H43813 Vitreous degeneration, bilateral: Secondary | ICD-10-CM | POA: Diagnosis not present

## 2020-08-17 DIAGNOSIS — H35033 Hypertensive retinopathy, bilateral: Secondary | ICD-10-CM | POA: Diagnosis not present

## 2020-08-17 DIAGNOSIS — H40013 Open angle with borderline findings, low risk, bilateral: Secondary | ICD-10-CM | POA: Diagnosis not present

## 2020-08-17 DIAGNOSIS — H35363 Drusen (degenerative) of macula, bilateral: Secondary | ICD-10-CM | POA: Diagnosis not present

## 2020-08-29 ENCOUNTER — Other Ambulatory Visit: Payer: Self-pay | Admitting: Family Medicine

## 2020-09-04 DIAGNOSIS — R928 Other abnormal and inconclusive findings on diagnostic imaging of breast: Secondary | ICD-10-CM | POA: Diagnosis not present

## 2020-09-04 LAB — HM MAMMOGRAPHY

## 2020-09-05 ENCOUNTER — Ambulatory Visit (INDEPENDENT_AMBULATORY_CARE_PROVIDER_SITE_OTHER): Payer: Medicare Other

## 2020-09-05 VITALS — BP 136/78 | HR 60 | Resp 16 | Ht 63.0 in | Wt 135.8 lb

## 2020-09-05 DIAGNOSIS — Z6824 Body mass index (BMI) 24.0-24.9, adult: Secondary | ICD-10-CM

## 2020-09-05 DIAGNOSIS — Z0000 Encounter for general adult medical examination without abnormal findings: Secondary | ICD-10-CM

## 2020-09-05 NOTE — Patient Instructions (Signed)
Bone Density Test A bone density test uses a type of X-ray to measure the amount of calcium and other minerals in a person's bones. It can measure bone density in the hip and the spine. The test is similar to having a regular X-ray. This test may also be called: Bone densitometry. Bone mineral density test. Dual-energy X-ray absorptiometry (DEXA). You may have this test to: Diagnose a condition that causes weak or thin bones (osteoporosis). Screen you for osteoporosis. Predict your risk for a broken bone (fracture). Determine how well your osteoporosis treatment is working. Tell a health care provider about: Any allergies you have. All medicines you are taking, including vitamins, herbs, eye drops, creams, and over-the-counter medicines. Any problems you or family members have had with anesthetic medicines. Any blood disorders you have. Any surgeries you have had. Any medical conditions you have. Whether you are pregnant or may be pregnant. Any medical tests you have had within the past 14 days that used contrast material. What are the risks? Generally, this is a safe test. However, it does expose you to a small amountof radiation, which can slightly increase your cancer risk. What happens before the test? Do not take any calcium supplements within the 24 hours before your test. You will need to remove all metal jewelry, eyeglasses, removable dental appliances, and any other metal objects on your body. What happens during the test?  You will lie down on an exam table. There will be an X-ray generator below you and an imaging device above you. Other devices, such as boxes or braces, may be used to position your body properly for the scan. The machine will slowly scan your body. You will need to keep very still while the machine does the scan. The images will show up on a screen in the room. Images will be examined by a specialist after your test is finished. The procedure may vary among  health care providers and hospitals. What can I expect after the test? It is up to you to get the results of your test. Ask your health care provider,or the department that is doing the test, when your results will be ready. Summary A bone density test is an imaging test that uses a type of X-ray to measure the amount of calcium and other minerals in your bones. The test may be used to diagnose or screen you for a condition that causes weak or thin bones (osteoporosis), predict your risk for a broken bone (fracture), or determine how well your osteoporosis treatment is working. Do not take any calcium supplements within 24 hours before your test. Ask your health care provider, or the department that is doing the test, when your results will be ready. This information is not intended to replace advice given to you by your health care provider. Make sure you discuss any questions you have with your healthcare provider. Document Revised: 07/29/2019 Document Reviewed: 07/29/2019 Elsevier Patient Education  2022 [place] and Cholesterol Restricted Eating Plan Eating a diet that limits fat and cholesterol may help lower your risk for heart disease and other conditions. Your body needs fat and cholesterol for basic functions, but eating too much of these things can be harmful to yourhealth. Your health care provider may order lab tests to check your blood fat (lipid) and cholesterol levels. This helps your health care provider understand your risk for certain conditions and whether you need to make diet changes. Work with your health care provider or dietitian  to make an eating plan that isright for you. Your plan includes: Limit your fat intake to ______% or less of your total calories a day. Limit your saturated fat intake to ______% or less of your total calories a day. Limit the amount of cholesterol in your diet to less than _________mg a day. Eat ___________ g of fiber a day. What are  tips for following this plan? General guidelines  If you are overweight, work with your health care provider to lose weight safely. Losing just 5-10% of your body weight can improve your overall health and help prevent diseases such as diabetes and heart disease. Avoid: Foods with added sugar. Fried foods. Foods that contain partially hydrogenated oils, including stick margarine, some tub margarines, cookies, crackers, and other baked goods. Limit alcohol intake to no more than 1 drink a day for nonpregnant women and 2 drinks a day for men. One drink equals 12 oz of beer, 5 oz of wine, or 1 oz of hard liquor.  Reading food labels Check food labels for: Trans fats, partially hydrogenated oils, or high amounts of saturated fat. Avoid foods that contain saturated fat and trans fat. The amount of cholesterol in each serving. Try to eat no more than 200 mg of cholesterol each day. The amount of fiber in each serving. Try to eat at least 20-30 g of fiber each day. Choose foods with healthy fats, such as: Monounsaturated and polyunsaturated fats. These include olive and canola oil, flaxseeds, walnuts, almonds, and seeds. Omega-3 fats. These are found in foods such as salmon, mackerel, sardines, tuna, flaxseed oil, and ground flaxseeds. Choose grain products that have whole grains. Look for the word "whole" as the first word in the ingredient list. Cooking Cook foods using methods other than frying. Baking, boiling, grilling, and broiling are some healthy options. Eat more home-cooked food and less restaurant, buffet, and fast food. Avoid cooking using saturated fats. Animal sources of saturated fats include meats, butter, and cream. Plant sources of saturated fats include palm oil, palm kernel oil, and coconut oil. Meal planning  At meals, imagine dividing your plate into fourths: Fill one-half of your plate with vegetables and green salads. Fill one-fourth of your plate with whole  grains. Fill one-fourth of your plate with lean protein foods. Eat fish that is high in omega-3 fats at least two times a week. Eat more foods that contain fiber, such as whole grains, beans, apples, broccoli, carrots, peas, and barley. These foods help promote healthy cholesterol levels in the blood.  Recommended foods Grains Whole grains, such as whole wheat or whole grain breads, crackers, cereals, and pasta. Unsweetened oatmeal, bulgur, barley, quinoa, or brown rice. Corn or whole wheat flour tortillas. Vegetables Fresh or frozen vegetables (raw, steamed, roasted, or grilled). Green salads. Fruits All fresh, canned (in natural juice), or frozen fruits. Meats and other protein foods Ground beef (85% or leaner), grass-fed beef, or beef trimmed of fat. Skinless chicken or [place]. Ground chicken or [place]. Pork trimmed of fat. All fish and seafood. Egg whites. Dried beans, peas, or lentils. Unsalted nuts or seeds. Unsalted canned beans. Natural nut butters without added sugar and oil. Dairy Low-fat or nonfat dairy products, such as skim or 1% milk, 2% or reduced-fat cheeses, low-fat and fat-free ricotta or cottage cheese, or plain low-fat and nonfat yogurt. Fats and oils Tub margarine without trans fats. Light or reduced-fat mayonnaise and salad dressings. Avocado. Olive, canola, sesame, or safflower oils. The items listed above may not  be a complete list of foods and beverages you can eat. Contact a dietitian for more information. Foods to avoid Grains White bread. White pasta. White rice. Cornbread. Bagels, pastries, and croissants. Crackers and snack foods that contain trans fat and hydrogenated oils. Vegetables Vegetables cooked in cheese, cream, or butter sauce. Fried vegetables. Fruits Canned fruit in heavy syrup. Fruit in cream or butter sauce. Fried fruit. Meats and other protein foods Fatty cuts of meat. Ribs, chicken wings, bacon, sausage, bologna, salami, chitterlings, fatback,  hot dogs, bratwurst, and packaged lunch meats. Liver and organ meats. Whole eggs and egg yolks. Chicken and [place] with skin. Fried meat. Dairy Whole or 2% milk, cream, half-and-half, and cream cheese. Whole milk cheeses. Whole-fat or sweetened yogurt. Full-fat cheeses. Nondairy creamers and whipped toppings. Processed cheese, cheese spreads, and cheese curds. Beverages Alcohol. Sugar-sweetened drinks such as sodas, lemonade, and fruit drinks. Fats and oils Butter, stick margarine, lard, shortening, ghee, or bacon fat. Coconut, palm kernel, and palm oils. Sweets and desserts Corn syrup, sugars, honey, and molasses. Candy. Jam and jelly. Syrup. Sweetened cereals. Cookies, pies, cakes, donuts, muffins, and ice cream. The items listed above may not be a complete list of foods and beverages you should avoid. Contact a dietitian for more information. Summary Your body needs fat and cholesterol for basic functions. However, eating too much of these things can be harmful to your health. Work with your health care provider and dietitian to follow a diet low in fat and cholesterol. Doing this may help lower your risk for heart disease and other conditions. Choose healthy fats, such as monounsaturated and polyunsaturated fats, and foods high in omega-3 fatty acids. Eat fiber-rich foods, such as whole grains, beans, peas, fruits, and vegetables. Limit or avoid alcohol, fried foods, and foods high in saturated fats, partially hydrogenated oils, and sugar. This information is not intended to replace advice given to you by your health care provider. Make sure you discuss any questions you have with your healthcare provider. Document Revised: 10/13/2019 Document Reviewed: 06/16/2019 Elsevier Patient Education  2022 [place] Prevention in the Home, Adult Falls can cause injuries and can happen to people of all ages. There are many things you can do to make your home safe and to help prevent falls.  Ask forhelp when making these changes. What actions can I take to prevent falls? General Instructions Use good lighting in all rooms. Replace any light bulbs that burn out. Turn on the lights in dark areas. Use night-lights. Keep items that you use often in easy-to-reach places. Lower the shelves around your home if needed. Set up your furniture so you have a clear path. Avoid moving your furniture around. Do not have throw rugs or other things on the floor that can make you trip. Avoid walking on wet floors. If any of your floors are uneven, fix them. Add color or contrast paint or tape to clearly mark and help you see: Grab bars or handrails. First and last steps of staircases. Where the edge of each step is. If you use a stepladder: Make sure that it is fully opened. Do not climb a closed stepladder. Make sure the sides of the stepladder are locked in place. Ask someone to hold the stepladder while you use it. Know where your pets are when moving through your home. What can I do in the bathroom?     Keep the floor dry. Clean up any water on the floor right away. Remove soap buildup  in the tub or shower. Use nonskid mats or decals on the floor of the tub or shower. Attach bath mats securely with double-sided, nonslip rug tape. If you need to sit down in the shower, use a plastic, nonslip stool. Install grab bars by the toilet and in the tub and shower. Do not use towel bars as grab bars. What can I do in the bedroom? Make sure that you have a light by your bed that is easy to reach. Do not use any sheets or blankets for your bed that hang to the floor. Have a firm chair with side arms that you can use for support when you get dressed. What can I do in the kitchen? Clean up any spills right away. If you need to reach something above you, use a step stool with a grab bar. Keep electrical cords out of the way. Do not use floor polish or wax that makes floors slippery. What can I  do with my stairs? Do not leave any items on the stairs. Make sure that you have a light switch at the top and the bottom of the stairs. Make sure that there are handrails on both sides of the stairs. Fix handrails that are broken or loose. Install nonslip stair treads on all your stairs. Avoid having throw rugs at the top or bottom of the stairs. Choose a carpet that does not hide the edge of the steps on the stairs. Check carpeting to make sure that it is firmly attached to the stairs. Fix carpet that is loose or worn. What can I do on the outside of my home? Use bright outdoor lighting. Fix the edges of walkways and driveways and fix any cracks. Remove anything that might make you trip as you walk through a door, such as a raised step or threshold. Trim any bushes or trees on paths to your home. Check to see if handrails are loose or broken and that both sides of all steps have handrails. Install guardrails along the edges of any raised decks and porches. Clear paths of anything that can make you trip, such as tools or rocks. Have leaves, snow, or ice cleared regularly. Use sand or salt on paths during winter. Clean up any spills in your garage right away. This includes grease or oil spills. What other actions can I take? Wear shoes that: Have a low heel. Do not wear high heels. Have rubber bottoms. Feel good on your feet and fit well. Are closed at the toe. Do not wear open-toe sandals. Use tools that help you move around if needed. These include: Canes. Walkers. Scooters. Crutches. Review your medicines with your doctor. Some medicines can make you feel dizzy. This can increase your chance of falling. Ask your doctor what else you can do to help prevent falls. Where to find more information Centers for Disease Control and Prevention, [place]: http://www.wolf.info/ National Institute on Aging: http://kim-miller.com/ Contact a doctor if: You are afraid of falling at home. You feel weak, drowsy,  or dizzy at home. You fall at home. Summary There are many simple things that you can do to make your home safe and to help prevent falls. Ways to make your home safe include removing things that can make you trip and installing grab bars in the bathroom. Ask for help when making these changes in your home. This information is not intended to replace advice given to you by your health care provider. Make sure you discuss any questions you have  with your healthcare provider. Document Revised: 09/15/2019 Document Reviewed: 09/15/2019 Elsevier Patient Education  [place] Maintenance, Female Adopting a healthy lifestyle and getting preventive care are important in promoting health and wellness. Ask your health care provider about: The right schedule for you to have regular tests and exams. Things you can do on your own to prevent diseases and keep yourself healthy. What should I know about diet, weight, and exercise? Eat a healthy diet  Eat a diet that includes plenty of vegetables, fruits, low-fat dairy products, and lean protein. Do not eat a lot of foods that are high in solid fats, added sugars, or sodium.  Maintain a healthy weight Body mass index (BMI) is used to identify weight problems. It estimates body fat based on height and weight. Your health care provider can help determineyour BMI and help you achieve or maintain a healthy weight. Get regular exercise Get regular exercise. This is one of the most important things you can do for your health. Most adults should: Exercise for at least 150 minutes each week. The exercise should increase your heart rate and make you sweat (moderate-intensity exercise). Do strengthening exercises at least twice a week. This is in addition to the moderate-intensity exercise. Spend less time sitting. Even light physical activity can be beneficial. Watch cholesterol and blood lipids Have your blood tested for lipids and cholesterol at  74 years of age, then havethis test every 5 years. Have your cholesterol levels checked more often if: Your lipid or cholesterol levels are high. You are older than 74 years of age. You are at high risk for heart disease. What should I know about cancer screening? Depending on your health history and family history, you may need to have cancer screening at various ages. This may include screening for: Breast cancer. Cervical cancer. Colorectal cancer. Skin cancer. Lung cancer. What should I know about heart disease, diabetes, and high blood pressure? Blood pressure and heart disease High blood pressure causes heart disease and increases the risk of stroke. This is more likely to develop in people who have high blood pressure readings, are of African descent, or are overweight. Have your blood pressure checked: Every 3-5 years if you are 10-70 years of age. Every year if you are 25 years old or older. Diabetes Have regular diabetes screenings. This checks your fasting blood sugar level. Have the screening done: Once every three years after age 62 if you are at a normal weight and have a low risk for diabetes. More often and at a younger age if you are overweight or have a high risk for diabetes. What should I know about preventing infection? Hepatitis B If you have a higher risk for hepatitis B, you should be screened for this virus. Talk with your health care provider to find out if you are at risk forhepatitis B infection. Hepatitis C Testing is recommended for: Everyone born from [birth year] through 1964. Anyone with known risk factors for hepatitis C. Sexually transmitted infections (STIs) Get screened for STIs, including gonorrhea and chlamydia, if: You are sexually active and are younger than 74 years of age. You are older than 74 years of age and your health care provider tells you that you are at risk for this type of infection. Your sexual activity has changed since you were last  screened, and you are at increased risk for chlamydia or gonorrhea. Ask your health care provider if you are at risk. Ask your health care provider about  whether you are at high risk for HIV. Your health care provider may recommend a prescription medicine to help prevent HIV infection. If you choose to take medicine to prevent HIV, you should first get tested for HIV. You should then be tested every 3 months for as long as you are taking the medicine. Pregnancy If you are about to stop having your period (premenopausal) and you may become pregnant, seek counseling before you get pregnant. Take 400 to 800 micrograms (mcg) of folic acid every day if you become pregnant. Ask for birth control (contraception) if you want to prevent pregnancy. Osteoporosis and menopause Osteoporosis is a disease in which the bones lose minerals and strength with aging. This can result in bone fractures. If you are 30 years old or older, or if you are at risk for osteoporosis and fractures, ask your health care provider if you should: Be screened for bone loss. Take a calcium or vitamin D supplement to lower your risk of fractures. Be given hormone replacement therapy (HRT) to treat symptoms of menopause. Follow these instructions at home: Lifestyle Do not use any products that contain nicotine or tobacco, such as cigarettes, e-cigarettes, and chewing tobacco. If you need help quitting, ask your health care provider. Do not use street drugs. Do not share needles. Ask your health care provider for help if you need support or information about quitting drugs. Alcohol use Do not drink alcohol if: Your health care provider tells you not to drink. You are pregnant, may be pregnant, or are planning to become pregnant. If you drink alcohol: Limit how much you use to 0-1 drink a day. Limit intake if you are breastfeeding. Be aware of how much alcohol is in your drink. In the [place] one drink equals one 12 oz bottle of beer  (355 mL), one 5 oz glass of wine (148 mL), or one 1 oz glass of hard liquor (44 mL). General instructions Schedule regular health, dental, and eye exams. Stay current with your vaccines. Tell your health care provider if: You often feel depressed. You have ever been abused or do not feel safe at home. Summary Adopting a healthy lifestyle and getting preventive care are important in promoting health and wellness. Follow your health care provider's instructions about healthy diet, exercising, and getting tested or screened for diseases. Follow your health care provider's instructions on monitoring your cholesterol and blood pressure. This information is not intended to replace advice given to you by your health care provider. Make sure you discuss any questions you have with your healthcare provider. Document Revised: 02/04/2018 Document Reviewed: 02/04/2018 Elsevier Patient Education  2022 [employer].

## 2020-09-05 NOTE — Progress Notes (Signed)
Subjective:   Dana [name] is a 75 y.o. female who presents for Medicare Annual (Subsequent) preventive examination.  This wellness visit is conducted by a nurse.  The patient's medications were reviewed and reconciled since the patient's last visit.  History details were provided by the patient.  The history appears to be reliable.    Patient's last AWV was one year ago.   Medical History: Patient history and Family history was reviewed  Medications, Allergies, and preventative health maintenance was reviewed and updated.   Review of Systems    Review of Systems  Constitutional: Negative.   HENT: Negative.    Eyes: Negative.   Respiratory: Negative.  Negative for cough, chest tightness and shortness of breath.   Cardiovascular: Negative.  Negative for chest pain and palpitations.  Musculoskeletal:  Positive for back pain.  Psychiatric/Behavioral: Negative.  Negative for confusion, dysphoric mood and suicidal ideas.    Cardiac Risk Factors include: advanced age (>19men, >15 women);hypertension;dyslipidemia     Objective:    Today's Vitals   09/05/20 0854  BP: 136/78  Pulse: 60  Resp: 16  Weight: 135 lb 12.8 oz (61.6 kg)  Height: 5\' 3"  (1.6 m)  PainSc: 2   PainLoc: Back   Body mass index is 24.06 kg/m.  Advanced Directives 09/05/2020 07/29/2019  Does Patient Have a Medical Advance Directive? Yes Yes  Type of Paramedic of [place];Living will [place];Living will  Does patient want to make changes to medical advance directive? No - Patient declined No - Patient declined  Copy of [place] in Chart? No - copy requested No - copy requested    Current Medications (verified) Outpatient Encounter Medications as of 09/05/2020  Medication Sig   ALPRAZolam (XANAX) 0.25 MG tablet TAKE 1 TABLET BY MOUTH DAILY AT BEDTIME AS NEEDED FOR SLEEP   ezetimibe (ZETIA) 10 MG tablet TAKE 1 TABLET BY MOUTH DAILY.    hydrochlorothiazide (HYDRODIURIL) 25 MG tablet Take 1 tablet (25 mg total) by mouth daily.   levothyroxine (SYNTHROID) 88 MCG tablet TAKE 1 TABLET BY MOUTH ONCE DAILY   losartan (COZAAR) 25 MG tablet TAKE 1 TABLET BY MOUTH IN THE MORNING AND AT BEDTIME   meloxicam (MOBIC) 7.5 MG tablet Take 1 tablet (7.5 mg total) by mouth 2 (two) times daily as needed for pain.   Nebivolol HCl 20 MG TABS TAKE 1 TABLET BY MOUTH ONCE DAILY   PREMARIN 0.625 MG tablet Take 0.625 mg by mouth daily.   No facility-administered encounter medications on file as of 09/05/2020.    Allergies (verified) Lipitor [atorvastatin], Penicillins, and Pravastatin   History: Past Medical History:  Diagnosis Date   Anxiety disorder 07/19/2019   Chronic back pain 03/10/2018   Depression    Dyslipidemia 07/28/2019   Dyspnea on exertion 03/19/2019   Essential hypertension 03/19/2019   History of ovarian cancer    Hyperlipidemia 04/2015   Hypertension 02/2019   Hypothyroidism 03/10/2018   Impingement syndrome of right shoulder 05/12/2014   Insomnia due to other mental disorder 07/19/2019   Lumbar back pain 07/20/2019   Mixed hyperlipidemia 03/19/2019   Right shoulder pain 05/12/2014   Sciatic nerve pain, left 07/19/2019   Sciatic nerve pain, right 07/19/2019   Past Surgical History:  Procedure Laterality Date   ABDOMINAL HYSTERECTOMY  1986   Total, also had chemo   [place]   To rule out recurrent ovarian cancer   TONSILLECTOMY  Family History  Problem Relation Age of Onset   Congestive Heart Failure Mother    Osteoarthritis Mother    Social History   Socioeconomic History   Marital status: Married    Spouse name: Timmothy Sours  Tobacco Use   Smoking status: Never   Smokeless tobacco: Never  Vaping Use   Vaping Use: Never used  Substance and Sexual Activity   Alcohol use: Yes    Comment: occasionally   Drug use: Not Currently    Types: Solvent inhalants   Social Determinants of Adult nurse Strain: Not on file  Food Insecurity: No Food Insecurity   Worried About Charity fundraiser in the Last Year: Never true   Ran Out of Food in the Last Year: Never true  Transportation Needs: No Transportation Needs   Lack of Transportation (Medical): No   Lack of Transportation (Non-Medical): No  Physical Activity: Insufficiently Active   Days of Exercise per Week: 1 day   Minutes of Exercise per Session: 30 min  Stress: Not on file  Social Connections: Not on file   Tobacco Counseling Counseling given: Never Smoker  Clinical Intake:  Pre-visit preparation completed: Yes Pain : 0-10 Pain Score: 2  Pain Type: Chronic pain Pain Location: Back Pain Descriptors / Indicators: Aching Pain Onset: More than a month ago Pain Frequency: Intermittent   BMI - recorded: 24.06 Nutritional Status: BMI of 19-24  Normal Nutritional Risks: None Diabetes: No How often do you need to have someone help you when you read instructions, pamphlets, or other written materials from your doctor or pharmacy?: 1 - Never Interpreter Needed?: No   Activities of Daily Living In your present state of health, do you have any difficulty performing the following activities: 09/05/2020 08/02/2020  Hearing? N N  Vision? N N  Difficulty concentrating or making decisions? N N  Walking or climbing stairs? N N  Dressing or bathing? N N  Doing errands, shopping? N N  Preparing Food and eating ? N -  Using the Toilet? N -  In the past six months, have you accidently leaked urine? N -  Do you have problems with loss of bowel control? N -  Managing your Medications? N -  Managing your Finances? N -  Housekeeping or managing your Housekeeping? N -  Some recent data might be hidden    Patient Care Team: Rochel Brome, MD as PCP - General (Family Medicine) Park Liter, MD as Consulting Physician (Cardiology) Marlaine Hind, MD as Consulting Physician (Physical Medicine and  Rehabilitation) Burnice Logan, [facility] as Pharmacist (Pharmacist) Maple Hudson. (Inactive) as Referring Physician (Obstetrics and Gynecology) Monna Fam, MD as Consulting Physician (Ophthalmology)  Indicate any recent Medical Services you may have received from other than [facility] providers in the past year (date may be approximate). Mammogram - [facility] (viewable in [place])    Assessment:   This is a routine wellness examination for [place].   Dietary issues and exercise activities discussed: Current Exercise Habits: The patient does not participate in regular exercise at present, Exercise limited by: Other - see comments (chronic back pain)   Goals Addressed             This Visit's Progress    Exercise 3x per week (30 min per time)   Not on track    As tolerated with back pain      Keep A1C Low   On track    Last A1C 5.7,  patient will modify diet and exercise to remain < Prediabetic at next lab check        Depression Screen PHQ 2/9 Scores 09/05/2020 08/02/2020 04/21/2020 03/07/2020 07/29/2019 07/20/2019  PHQ - 2 Score 0 0 0 0 0 0    Fall Risk Fall Risk  09/05/2020 08/02/2020 04/21/2020 04/21/2020 07/29/2019  Falls in the past year? 0 0 1 0 0  Number falls in past yr: 0 0 0 0 0  Injury with Fall? 0 0 0 0 0  Risk for fall due to : No Fall Risks No Fall Risks History of fall(s);No Fall Risks - No Fall Risks  Follow up Falls evaluation completed Falls evaluation completed Falls evaluation completed;Falls prevention discussed - Falls evaluation completed;Falls prevention discussed   Cognitive Function:     6CIT Screen 09/05/2020 07/29/2019  What Year? 0 points 0 points  What month? 0 points 0 points  What time? 0 points 0 points  Count back from 20 0 points 0 points  Months in reverse 0 points 0 points  Repeat phrase 0 points 0 points  Total Score 0 0    Immunizations Immunization History  Administered Date(s) Administered   Influenza Inj Mdck Quad Pf 11/05/2019    Influenza-Unspecified 11/25/2017, 11/30/2018   PFIZER Comirnaty(Gray Top)Covid-19 Tri-Sucrose Vaccine 03/13/2019, 04/03/2019, 11/26/2019, 06/05/2020   Pneumococcal Conjugate-13 05/13/2013, 05/30/2014   Pneumococcal Polysaccharide-23 08/03/2011   Tdap 03/11/2013   Zoster Recombinat (Shingrix) 01/26/2018    TDAP status: Up to date  Flu Vaccine status: Up to date  Pneumococcal vaccine status: Up to date  Covid-19 vaccine status: Completed vaccines  Screening Tests Health Maintenance  Topic Date Due   Hepatitis C Screening  Never done   Zoster Vaccines- Shingrix (2 of 2) 03/23/2018   INFLUENZA VACCINE  09/25/2020   COVID-19 Vaccine (5 - Booster for Pfizer series) 10/05/2020   MAMMOGRAM  09/04/2021   Fecal DNA (Cologuard)  09/06/2021   TETANUS/TDAP  03/12/2023   DEXA SCAN  Completed   PNA vac Low Risk Adult  Completed   HPV VACCINES  Aged Out    Health Maintenance  Health Maintenance Due  Topic Date Due   Hepatitis C Screening  Never done   Zoster Vaccines- Shingrix (2 of 2) 03/23/2018    Colorectal cancer screening: Type of screening: Cologuard. Completed 09/07/2018. Repeat every 3 years  Mammogram status: Completed 09/04/20. Repeat every year  Bone Density status: Completed 07/2017. Results reflect: Bone density results: NORMAL. Repeat every 2 years.  Lung Cancer Screening: (Low Dose CT Chest recommended if Age 74-80 years, 30 pack-year currently smoking OR have quit w/in 15years.) does not qualify.   Additional Screening:  Vision Screening: Recommended annual ophthalmology exams for early detection of glaucoma and other disorders of the eye. Is the patient up to date with their annual eye exam?  Yes  Who is the provider or what is the name of the office in which the patient attends annual eye exams? [facility], [place]  Dental Screening: Recommended annual dental exams for proper oral hygiene    Plan:    1- Mammogram - continue yearly  screenings 2- DEXA - last done in 2019 - Results WNL 3- Diet/Exercise - Continue healthy diet adding exercise daily as tolerated by chronic back pain 4- Colorectal Cancer Screening - Cologuard was normal in 2020 - follow-up due in 2023 5- Get annual flu vaccine this fall 6- Advance Directive - bring copy for our records  I have personally reviewed and  noted the following in the patient's chart:   Medical and social history Use of alcohol, tobacco or illicit drugs  Current medications and supplements including opioid prescriptions.  Functional ability and status Nutritional status Physical activity Advanced directives List of other physicians Hospitalizations, surgeries, and ER visits in previous 12 months Vitals Screenings to include cognitive, depression, and falls Referrals and appointments  In addition, I have reviewed and discussed with patient certain preventive protocols, quality metrics, and best practice recommendations. A written personalized care plan for preventive services as well as general preventive health recommendations were provided to patient.     Erie Noe, LPN   [id number]

## 2020-09-22 ENCOUNTER — Other Ambulatory Visit: Payer: Self-pay | Admitting: Family Medicine

## 2020-10-10 DIAGNOSIS — R69 Illness, unspecified: Secondary | ICD-10-CM | POA: Diagnosis not present

## 2020-10-12 ENCOUNTER — Other Ambulatory Visit: Payer: Self-pay | Admitting: Family Medicine

## 2020-10-23 ENCOUNTER — Other Ambulatory Visit: Payer: Self-pay | Admitting: Family Medicine

## 2020-10-23 ENCOUNTER — Other Ambulatory Visit: Payer: Self-pay | Admitting: Physician Assistant

## 2020-10-26 ENCOUNTER — Other Ambulatory Visit: Payer: Self-pay | Admitting: Family Medicine

## 2020-10-26 DIAGNOSIS — I10 Essential (primary) hypertension: Secondary | ICD-10-CM

## 2020-11-10 ENCOUNTER — Encounter: Payer: Self-pay | Admitting: Family Medicine

## 2020-11-10 ENCOUNTER — Ambulatory Visit (INDEPENDENT_AMBULATORY_CARE_PROVIDER_SITE_OTHER): Payer: Medicare Other | Admitting: Family Medicine

## 2020-11-10 ENCOUNTER — Other Ambulatory Visit: Payer: Self-pay

## 2020-11-10 VITALS — BP 122/66 | HR 57 | Ht 63.0 in | Wt 137.0 lb

## 2020-11-10 DIAGNOSIS — Z23 Encounter for immunization: Secondary | ICD-10-CM

## 2020-11-10 DIAGNOSIS — E782 Mixed hyperlipidemia: Secondary | ICD-10-CM

## 2020-11-10 DIAGNOSIS — E034 Atrophy of thyroid (acquired): Secondary | ICD-10-CM | POA: Diagnosis not present

## 2020-11-10 DIAGNOSIS — I10 Essential (primary) hypertension: Secondary | ICD-10-CM

## 2020-11-10 DIAGNOSIS — F419 Anxiety disorder, unspecified: Secondary | ICD-10-CM

## 2020-11-10 DIAGNOSIS — R739 Hyperglycemia, unspecified: Secondary | ICD-10-CM | POA: Diagnosis not present

## 2020-11-10 NOTE — Progress Notes (Signed)
Subjective:  Patient ID: Dana [name], female    DOB: June 21, 1945  Age: 75 y.o. MRN: [id number]  Chief Complaint  Patient presents with   Hyperlipidemia   Hypertension   HPI Hyperlipidemia: Current medications: Zetia 10 mg   Hypertension: Complications: Current medications: HCTZ 25 mg , Losartan 25 mg, Nebivolol 20 mg  Diet: Healthy Exercise: Daily Current Outpatient Medications on File Prior to Visit  Medication Sig Dispense Refill   ALPRAZolam (XANAX) 0.25 MG tablet TAKE 1 TABLET BY MOUTH DAILY AT BEDTIME AS NEEDED FOR SLEEP 30 tablet 2   ezetimibe (ZETIA) 10 MG tablet TAKE 1 TABLET BY MOUTH DAILY. 90 tablet 0   hydrochlorothiazide (HYDRODIURIL) 25 MG tablet TAKE 1 TABLET BY MOUTH DAILY 30 tablet 1   levothyroxine (SYNTHROID) 88 MCG tablet TAKE 1 TABLET BY MOUTH ONCE DAILY 90 tablet 0   losartan (COZAAR) 25 MG tablet TAKE 1 TABLET BY MOUTH IN THE MORNING AND AT BEDTIME 180 tablet 0   meloxicam (MOBIC) 7.5 MG tablet Take 1 tablet (7.5 mg total) by mouth 2 (two) times daily as needed for pain. 60 tablet 1   Nebivolol HCl 20 MG TABS TAKE 1 TABLET BY MOUTH ONCE DAILY 90 tablet 1   PREMARIN 0.625 MG tablet Take 0.625 mg by mouth daily.     No current facility-administered medications on file prior to visit.   Past Medical History:  Diagnosis Date   Anxiety disorder 07/19/2019   Chronic back pain 03/10/2018   Depression    Dyslipidemia 07/28/2019   Dyspnea on exertion 03/19/2019   Essential hypertension 03/19/2019   History of ovarian cancer    Hyperlipidemia 04/2015   Hypertension 02/2019   Hypothyroidism 03/10/2018   Impingement syndrome of right shoulder 05/12/2014   Insomnia due to other mental disorder 07/19/2019   Lumbar back pain 07/20/2019   Mixed hyperlipidemia 03/19/2019   Right shoulder pain 05/12/2014   Sciatic nerve pain, left 07/19/2019   Sciatic nerve pain, right 07/19/2019   Past Surgical History:  Procedure Laterality Date   ABDOMINAL HYSTERECTOMY  1986    Total, also had chemo   [place]   To rule out recurrent ovarian cancer   TONSILLECTOMY      Family History  Problem Relation Age of Onset   Congestive Heart Failure Mother    Osteoarthritis Mother    Social History   Socioeconomic History   Marital status: Married    Spouse name: Timmothy Sours   Number of children: Not on file   Years of education: Not on file   Highest education level: Not on file  Occupational History   Not on file  Tobacco Use   Smoking status: Never   Smokeless tobacco: Never  Vaping Use   Vaping Use: Never used  Substance and Sexual Activity   Alcohol use: Yes    Comment: occasionally   Drug use: Not Currently    Types: Solvent inhalants   Sexual activity: Not on file  Other Topics Concern   Not on file  Social History Narrative   Not on file   Social Determinants of Health   Financial Resource Strain: Not on file  Food Insecurity: No Food Insecurity   Worried About Running Out of Food in the Last Year: Never true   Ran Out of Food in the Last Year: Never true  Transportation Needs: No Transportation Needs   Lack of Transportation (Medical): No   Lack of Transportation (Non-Medical): No  Physical Activity: Insufficiently Active   Days of Exercise per Week: 1 day   Minutes of Exercise per Session: 30 min  Stress: Not on file  Social Connections: Not on file    Review of Systems  Constitutional:  Negative for chills, fatigue and fever.  HENT:  Negative for congestion, ear pain and sore throat.   Respiratory:  Negative for cough and shortness of breath.   Cardiovascular:  Negative for chest pain.  Gastrointestinal:  Negative for abdominal pain, constipation, diarrhea, nausea and vomiting.  Genitourinary:  Negative for dysuria and urgency.  Musculoskeletal:  Positive for back pain (1.  Intermittent). Negative for arthralgias and myalgias.  Skin:  Negative for rash.  Neurological:  Negative for dizziness and headaches.   Psychiatric/Behavioral:  Negative for dysphoric mood. The patient is not nervous/anxious.     Objective:  BP 122/66   Pulse (!) 57   Ht '5\' 3"'$  (1.6 m)   Wt 137 lb (62.1 kg)   SpO2 100%   BMI 24.27 kg/m   BP/Weight 11/10/2020 [id number]  Systolic BP 123XX123 [id number]  Diastolic BP 66 78 68  Wt. (Lbs) 137 135.8 133  BMI 24.27 24.06 23.56    Physical Exam Vitals reviewed.  Constitutional:      Appearance: Normal appearance. She is normal weight.  Neck:     Vascular: No carotid bruit.  Cardiovascular:     Rate and Rhythm: Normal rate and regular rhythm.     Heart sounds: Normal heart sounds.  Pulmonary:     Effort: Pulmonary effort is normal. No respiratory distress.     Breath sounds: Normal breath sounds.  Abdominal:     General: Abdomen is flat. Bowel sounds are normal.     Palpations: Abdomen is soft.     Tenderness: There is no abdominal tenderness.  Neurological:     Mental Status: She is alert and oriented to person, place, and time.  Psychiatric:        Mood and Affect: Mood normal.        Behavior: Behavior normal.    Diabetic Foot Exam - Simple   No data filed      Lab Results  Component Value Date   WBC CANCELED 11/10/2020   HGB CANCELED 11/10/2020   HCT CANCELED 11/10/2020   PLT CANCELED 11/10/2020   GLUCOSE 108 (H) 11/10/2020   CHOL 208 (H) 11/10/2020   TRIG 60 11/10/2020   HDL 87 11/10/2020   LDLCALC 110 (H) 11/10/2020   ALT 8 11/10/2020   AST 15 11/10/2020   NA 138 11/10/2020   K 4.3 11/10/2020   CL 99 11/10/2020   CREATININE 0.44 (L) 11/10/2020   BUN 16 11/10/2020   CO2 26 11/10/2020   TSH 2.550 11/10/2020   HGBA1C 5.9 (H) 11/10/2020      Assessment & Plan:   Problem List Items Addressed This Visit       Cardiovascular and Mediastinum   Essential hypertension - Primary    The current medical regimen is effective;  continue present plan and medications.       Relevant Orders   Comprehensive metabolic panel (Completed)      Endocrine   Hypothyroidism    The current medical regimen is effective;  continue present plan and medications.       Relevant Orders   TSH (Completed)     Other   Mixed hyperlipidemia    Well controlled.  No changes to medicines.  Continue to work on  eating a healthy diet and exercise.  Labs drawn today.        Relevant Orders   CBC with Differential/Platelet (Completed)   Lipid panel (Completed)   Anxiety disorder    Well controlled.  The current medical regimen is effective;  continue present plan and medications.       Elevated blood sugar    A1C 5.9. STABLE.  Recommend continue to work on eating healthy diet and exercise.       Relevant Orders   Hemoglobin A1c (Completed)   Other Visit Diagnoses     Need for influenza vaccination       Relevant Orders   CBC with Differential/Platelet (Completed)   Flu Vaccine QUAD High Dose(Fluad) (Completed)     .  No orders of the defined types were placed in this encounter.   Orders Placed This Encounter  Procedures   Flu Vaccine QUAD High Dose(Fluad)   CBC with Differential/Platelet   Comprehensive metabolic panel   Lipid panel   Hemoglobin A1c   TSH   Cardiovascular Risk Assessment      Follow-up: Return in about 4 months (around 03/12/2021) for chronic fasting.  An After Visit Summary was printed and given to the patient.  Rochel Brome, MD [facility] [phone]

## 2020-11-11 LAB — HEMOGLOBIN A1C
Est. average glucose Bld gHb Est-mCnc: 123 mg/dL
Hgb A1c MFr Bld: 5.9 % — ABNORMAL HIGH (ref 4.8–5.6)

## 2020-11-11 LAB — TSH: TSH: 2.55 u[IU]/mL (ref 0.450–4.500)

## 2020-11-14 LAB — COMPREHENSIVE METABOLIC PANEL
ALT: 8 IU/L (ref 0–32)
AST: 15 IU/L (ref 0–40)
Albumin/Globulin Ratio: 2 (ref 1.2–2.2)
Albumin: 4.1 g/dL (ref 3.7–4.7)
Alkaline Phosphatase: 56 IU/L (ref 44–121)
BUN/Creatinine Ratio: 36 — ABNORMAL HIGH (ref 12–28)
BUN: 16 mg/dL (ref 8–27)
Bilirubin Total: 0.4 mg/dL (ref 0.0–1.2)
CO2: 26 mmol/L (ref 20–29)
Calcium: 9.3 mg/dL (ref 8.7–10.3)
Chloride: 99 mmol/L (ref 96–106)
Creatinine, Ser: 0.44 mg/dL — ABNORMAL LOW (ref 0.57–1.00)
Globulin, Total: 2.1 g/dL (ref 1.5–4.5)
Glucose: 108 mg/dL — ABNORMAL HIGH (ref 65–99)
Potassium: 4.3 mmol/L (ref 3.5–5.2)
Sodium: 138 mmol/L (ref 134–144)
Total Protein: 6.2 g/dL (ref 6.0–8.5)
eGFR: 101 mL/min/{1.73_m2} (ref >59)

## 2020-11-14 LAB — LIPID PANEL
Chol/HDL Ratio: 2.4 ratio (ref 0.0–4.4)
Cholesterol, Total: 208 mg/dL — ABNORMAL HIGH (ref 100–199)
HDL: 87 mg/dL (ref >39)
LDL Chol Calc (NIH): 110 mg/dL — ABNORMAL HIGH (ref 0–99)
Triglycerides: 60 mg/dL (ref 0–149)
VLDL Cholesterol Cal: 11 mg/dL (ref 5–40)

## 2020-11-14 LAB — CBC WITH DIFFERENTIAL/PLATELET

## 2020-11-20 ENCOUNTER — Encounter: Payer: Self-pay | Admitting: Family Medicine

## 2020-11-20 DIAGNOSIS — R739 Hyperglycemia, unspecified: Secondary | ICD-10-CM | POA: Insufficient documentation

## 2020-11-20 NOTE — Assessment & Plan Note (Signed)
Well controlled.  ?No changes to medicines.  ?Continue to work on eating a healthy diet and exercise.  ?Labs drawn today.  ?

## 2020-11-20 NOTE — Assessment & Plan Note (Signed)
The current medical regimen is effective;  continue present plan and medications.  

## 2020-11-20 NOTE — Assessment & Plan Note (Signed)
A1C 5.9. STABLE.  Recommend continue to work on eating healthy diet and exercise.

## 2020-11-20 NOTE — Assessment & Plan Note (Signed)
Well controlled.  The current medical regimen is effective;  continue present plan and medications. 

## 2020-11-29 ENCOUNTER — Other Ambulatory Visit: Payer: Self-pay | Admitting: Family Medicine

## 2020-12-18 ENCOUNTER — Ambulatory Visit (INDEPENDENT_AMBULATORY_CARE_PROVIDER_SITE_OTHER): Payer: Medicare Other

## 2020-12-18 ENCOUNTER — Other Ambulatory Visit: Payer: Self-pay

## 2020-12-18 DIAGNOSIS — Z23 Encounter for immunization: Secondary | ICD-10-CM | POA: Diagnosis not present

## 2020-12-20 ENCOUNTER — Other Ambulatory Visit: Payer: Self-pay | Admitting: Family Medicine

## 2020-12-20 ENCOUNTER — Other Ambulatory Visit: Payer: Self-pay | Admitting: Cardiology

## 2021-01-08 ENCOUNTER — Other Ambulatory Visit: Payer: Self-pay | Admitting: Family Medicine

## 2021-01-16 ENCOUNTER — Other Ambulatory Visit: Payer: Self-pay | Admitting: Family Medicine

## 2021-01-23 ENCOUNTER — Other Ambulatory Visit: Payer: Self-pay | Admitting: Family Medicine

## 2021-01-27 ENCOUNTER — Other Ambulatory Visit: Payer: Self-pay | Admitting: Family Medicine

## 2021-01-27 DIAGNOSIS — I10 Essential (primary) hypertension: Secondary | ICD-10-CM

## 2021-02-16 ENCOUNTER — Other Ambulatory Visit: Payer: Self-pay | Admitting: Cardiology

## 2021-02-24 ENCOUNTER — Other Ambulatory Visit: Payer: Self-pay | Admitting: Family Medicine

## 2021-03-11 NOTE — Progress Notes (Signed)
Subjective:  Patient ID: Dana [name], female    DOB: 1945/03/17  Age: 76 y.o. MRN: [id number]  Chief Complaint  Patient presents with   Hypertension   Hyperlipidemia   Hypothyroidism   HPI: Hyperlipidemia: Current medications: Zetia 10mg  taking 1/2(5mg ) tablet daily. Got achy with whole pill. Can tolerate a whole pill.   Hypertension: Current medications: Losartan 25mg  take 1 tablet BID, HCTZ 25mg  take 1 tablet daily, Nebivolol HCL 20mg  take one tablet daily. Bp is good at home when she checks it.    Hypothyroidism: Current medications: Levothyroxine 66mcg take 1 tablet daily.  Anxiety: Current medications: Xanax 0.25mg  take 1/2 tablet daily at night   Current Outpatient Medications on File Prior to Visit  Medication Sig Dispense Refill   ALPRAZolam (XANAX) 0.25 MG tablet TAKE 1 TABLET BY MOUTH DAILY AT BEDTIME AS NEEDED FOR SLEEP 30 tablet 5   ezetimibe (ZETIA) 10 MG tablet TAKE 1 TABLET BY MOUTH DAILY. (Patient taking differently: Take 5 mg by mouth daily.) 90 tablet 0   hydrochlorothiazide (HYDRODIURIL) 25 MG tablet TAKE 1 TABLET BY MOUTH DAILY 90 tablet 3   levothyroxine (SYNTHROID) 88 MCG tablet TAKE 1 TABLET BY MOUTH ONCE DAILY 90 tablet 0   losartan (COZAAR) 25 MG tablet TAKE 1 TABLET BY MOUTH IN THE MORNING AND AT BEDTIME 180 tablet 0   meloxicam (MOBIC) 7.5 MG tablet Take 1 tablet (7.5 mg total) by mouth 2 (two) times daily as needed for pain. 60 tablet 1   Nebivolol HCl 20 MG TABS TAKE 1 TABLET BY MOUTH ONCE DAILY 30 tablet 0   No current facility-administered medications on file prior to visit.   Past Medical History:  Diagnosis Date   Anxiety disorder 07/19/2019   Chronic back pain 03/10/2018   Depression    Dyslipidemia 07/28/2019   Dyspnea on exertion 03/19/2019   Essential hypertension 03/19/2019   History of ovarian cancer    Hyperlipidemia 04/2015   Hypertension 02/2019   Hypothyroidism 03/10/2018   Impingement syndrome of right shoulder 05/12/2014    Insomnia due to other mental disorder 07/19/2019   Lumbar back pain 07/20/2019   Mixed hyperlipidemia 03/19/2019   Right shoulder pain 05/12/2014   Sciatic nerve pain, left 07/19/2019   Sciatic nerve pain, right 07/19/2019   Past Surgical History:  Procedure Laterality Date   ABDOMINAL HYSTERECTOMY  1986   Total, also had chemo   [place]   To rule out recurrent ovarian cancer   TONSILLECTOMY      Family History  Problem Relation Age of Onset   Congestive Heart Failure Mother    Osteoarthritis Mother    Social History   Socioeconomic History   Marital status: Married    Spouse name: Timmothy Sours   Number of children: Not on file   Years of education: Not on file   Highest education level: Not on file  Occupational History   Not on file  Tobacco Use   Smoking status: Never   Smokeless tobacco: Never  Vaping Use   Vaping Use: Never used  Substance and Sexual Activity   Alcohol use: Yes    Comment: occasionally   Drug use: Not Currently    Types: Solvent inhalants   Sexual activity: Not on file  Other Topics Concern   Not on file  Social History Narrative   Not on file   Social Determinants of Health   Financial Resource Strain: Not on file  Food Insecurity:  Not on file  Transportation Needs: Not on file  Physical Activity: Not on file  Stress: Not on file  Social Connections: Not on file    Review of Systems  Constitutional:  Negative for appetite change, fatigue and fever.  HENT:  Negative for congestion, ear pain, sinus pressure and sore throat.   Eyes:  Negative for pain.  Respiratory:  Negative for cough, chest tightness, shortness of breath and wheezing.   Cardiovascular:  Negative for chest pain and palpitations.  Gastrointestinal:  Negative for abdominal pain, constipation, diarrhea, nausea and vomiting.  Genitourinary:  Negative for dysuria and hematuria.  Musculoskeletal:  Negative for arthralgias, back pain, joint swelling and  myalgias.  Skin:  Negative for rash.  Neurological:  Negative for dizziness, weakness and headaches.  Psychiatric/Behavioral:  Negative for dysphoric mood. The patient is not nervous/anxious.     Objective:  BP (!) 118/58 (BP Location: Right Arm, Patient Position: Sitting)    Pulse (!) 55    Temp 97.8 F (36.6 C) (Temporal)    Ht 5\' 3"  (1.6 m)    Wt 140 lb (63.5 kg)    SpO2 99%    BMI 24.80 kg/m   BP/Weight 03/12/2021 11/10/2020 05/08/9700  Systolic BP 637 858 850  Diastolic BP 58 66 78  Wt. (Lbs) 140 137 135.8  BMI 24.8 24.27 24.06    Physical Exam Vitals reviewed.  Constitutional:      Appearance: Normal appearance. She is normal weight.  Neck:     Vascular: No carotid bruit.  Cardiovascular:     Rate and Rhythm: Normal rate and regular rhythm.     Pulses: Normal pulses.     Heart sounds: Normal heart sounds.  Pulmonary:     Effort: Pulmonary effort is normal. No respiratory distress.     Breath sounds: Normal breath sounds.  Abdominal:     General: Abdomen is flat. Bowel sounds are normal.     Palpations: Abdomen is soft.     Tenderness: There is no abdominal tenderness.  Neurological:     Mental Status: She is alert and oriented to person, place, and time.  Psychiatric:        Mood and Affect: Mood normal.        Behavior: Behavior normal.    Diabetic Foot Exam - Simple   No data filed      Lab Results  Component Value Date   WBC 5.3 03/12/2021   HGB 13.0 03/12/2021   HCT 38.6 03/12/2021   PLT 350 03/12/2021   GLUCOSE 105 (H) 03/12/2021   CHOL 220 (H) 03/12/2021   TRIG 76 03/12/2021   HDL 83 03/12/2021   LDLCALC 124 (H) 03/12/2021   ALT 8 03/12/2021   AST 16 03/12/2021   NA 140 03/12/2021   K 4.7 03/12/2021   CL 102 03/12/2021   CREATININE 0.73 03/12/2021   BUN 15 03/12/2021   CO2 26 03/12/2021   TSH 2.550 11/10/2020   HGBA1C 5.9 (H) 11/10/2020      Assessment & Plan:   Problem List Items Addressed This Visit       Cardiovascular and  Mediastinum   Essential hypertension - Primary    The current medical regimen is effective;  continue present plan and medications.       Relevant Orders   CBC With Diff/Platelet (Completed)   Comprehensive metabolic panel (Completed)     Endocrine   Hypothyroidism    The current medical regimen is effective;  continue present  plan and medications.         Other   Mixed hyperlipidemia    Fairly well controlled.  No changes to medicines.  Continue to work on eating a healthy diet and exercise.  Labs drawn today.        Relevant Orders   Lipid panel (Completed)   Anxiety disorder    Encouraged to wean off xanax as has already made a good start.      .  Meds ordered this encounter  Medications   PREMARIN 0.625 MG tablet    Sig: Take 1 tablet (0.625 mg total) by mouth daily.    Dispense:  90 tablet    Refill:  3    Orders Placed This Encounter  Procedures   CBC With Diff/Platelet   Comprehensive metabolic panel   Lipid panel   Cardiovascular Risk Assessment     Follow-up: Return in about 6 months (around 09/09/2021) for chronic fasting.  An After Visit Summary was printed and given to the patient.   I,Lauren M Auman,acting as a scribe for Rochel Brome, MD.,have documented all relevant documentation on the behalf of Rochel Brome, MD,as directed by  Rochel Brome, MD while in the presence of Rochel Brome, MD.   Rochel Brome, MD [place] [phone]

## 2021-03-12 ENCOUNTER — Ambulatory Visit (INDEPENDENT_AMBULATORY_CARE_PROVIDER_SITE_OTHER): Payer: Medicare Other | Admitting: Family Medicine

## 2021-03-12 ENCOUNTER — Encounter: Payer: Self-pay | Admitting: Family Medicine

## 2021-03-12 ENCOUNTER — Other Ambulatory Visit: Payer: Self-pay

## 2021-03-12 VITALS — BP 118/58 | HR 55 | Temp 97.8°F | Ht 63.0 in | Wt 140.0 lb

## 2021-03-12 DIAGNOSIS — F419 Anxiety disorder, unspecified: Secondary | ICD-10-CM

## 2021-03-12 DIAGNOSIS — E034 Atrophy of thyroid (acquired): Secondary | ICD-10-CM

## 2021-03-12 DIAGNOSIS — E782 Mixed hyperlipidemia: Secondary | ICD-10-CM

## 2021-03-12 DIAGNOSIS — I10 Essential (primary) hypertension: Secondary | ICD-10-CM

## 2021-03-12 DIAGNOSIS — R7309 Other abnormal glucose: Secondary | ICD-10-CM | POA: Diagnosis not present

## 2021-03-12 MED ORDER — PREMARIN 0.625 MG PO TABS
0.6250 mg | ORAL_TABLET | Freq: Every day | ORAL | 3 refills | Status: DC
Start: 2021-03-12 — End: 2023-03-14

## 2021-03-12 NOTE — Assessment & Plan Note (Signed)
The current medical regimen is effective;  continue present plan and medications.  

## 2021-03-12 NOTE — Assessment & Plan Note (Signed)
Encouraged to wean off xanax as has already made a good start.

## 2021-03-12 NOTE — Assessment & Plan Note (Signed)
Fairly well controlled.  No changes to medicines.  Continue to work on eating a healthy diet and exercise.  Labs drawn today.

## 2021-03-13 LAB — CBC WITH DIFF/PLATELET
Basophils Absolute: 0 10*3/uL (ref 0.0–0.2)
Basos: 1 %
EOS (ABSOLUTE): 0.1 10*3/uL (ref 0.0–0.4)
Eos: 2 %
Hematocrit: 38.6 % (ref 34.0–46.6)
Hemoglobin: 13 g/dL (ref 11.1–15.9)
Immature Grans (Abs): 0 10*3/uL (ref 0.0–0.1)
Immature Granulocytes: 0 %
Lymphocytes Absolute: 1.5 10*3/uL (ref 0.7–3.1)
Lymphs: 29 %
MCH: 31.6 pg (ref 26.6–33.0)
MCHC: 33.7 g/dL (ref 31.5–35.7)
MCV: 94 fL (ref 79–97)
Monocytes Absolute: 0.4 10*3/uL (ref 0.1–0.9)
Monocytes: 7 %
Neutrophils Absolute: 3.2 10*3/uL (ref 1.4–7.0)
Neutrophils: 61 %
Platelets: 350 10*3/uL (ref 150–450)
RBC: 4.12 x10E6/uL (ref 3.77–5.28)
RDW: 12.4 % (ref 11.7–15.4)
WBC: 5.3 10*3/uL (ref 3.4–10.8)

## 2021-03-13 LAB — COMPREHENSIVE METABOLIC PANEL
ALT: 8 IU/L (ref 0–32)
AST: 16 IU/L (ref 0–40)
Albumin/Globulin Ratio: 2.2 (ref 1.2–2.2)
Albumin: 4 g/dL (ref 3.7–4.7)
Alkaline Phosphatase: 51 IU/L (ref 44–121)
BUN/Creatinine Ratio: 21 (ref 12–28)
BUN: 15 mg/dL (ref 8–27)
Bilirubin Total: 0.3 mg/dL (ref 0.0–1.2)
CO2: 26 mmol/L (ref 20–29)
Calcium: 9.6 mg/dL (ref 8.7–10.3)
Chloride: 102 mmol/L (ref 96–106)
Creatinine, Ser: 0.73 mg/dL (ref 0.57–1.00)
Globulin, Total: 1.8 g/dL (ref 1.5–4.5)
Glucose: 105 mg/dL — ABNORMAL HIGH (ref 70–99)
Potassium: 4.7 mmol/L (ref 3.5–5.2)
Sodium: 140 mmol/L (ref 134–144)
Total Protein: 5.8 g/dL — ABNORMAL LOW (ref 6.0–8.5)
eGFR: 86 mL/min/{1.73_m2} (ref >59)

## 2021-03-13 LAB — LIPID PANEL
Chol/HDL Ratio: 2.7 ratio (ref 0.0–4.4)
Cholesterol, Total: 220 mg/dL — ABNORMAL HIGH (ref 100–199)
HDL: 83 mg/dL (ref >39)
LDL Chol Calc (NIH): 124 mg/dL — ABNORMAL HIGH (ref 0–99)
Triglycerides: 76 mg/dL (ref 0–149)
VLDL Cholesterol Cal: 13 mg/dL (ref 5–40)

## 2021-03-15 ENCOUNTER — Other Ambulatory Visit: Payer: Self-pay | Admitting: Cardiology

## 2021-03-15 LAB — HGB A1C W/O EAG: Hgb A1c MFr Bld: 5.8 % — ABNORMAL HIGH (ref 4.8–5.6)

## 2021-03-15 LAB — SPECIMEN STATUS REPORT

## 2021-03-21 ENCOUNTER — Encounter: Payer: Self-pay | Admitting: Family Medicine

## 2021-03-21 ENCOUNTER — Ambulatory Visit (INDEPENDENT_AMBULATORY_CARE_PROVIDER_SITE_OTHER): Payer: Medicare Other | Admitting: Family Medicine

## 2021-03-21 ENCOUNTER — Other Ambulatory Visit: Payer: Self-pay

## 2021-03-21 VITALS — BP 120/70 | HR 68 | Temp 96.8°F | Resp 16 | Wt 138.0 lb

## 2021-03-21 DIAGNOSIS — G4483 Primary cough headache: Secondary | ICD-10-CM

## 2021-03-21 HISTORY — DX: Primary cough headache: G44.83

## 2021-03-21 NOTE — Progress Notes (Signed)
Acute Office Visit  Subjective:    Patient ID: Dana [name], female    DOB: 1945-04-16, 76 y.o.   MRN: [id number]  Chief Complaint  Patient presents with   Headache    HPI: Patient is in today for complaints of left frontal headache which occurs after coughing or sneezing.  She reports the headache as being very severe with duration of 1 hour.  No associated neurological deficits. They are occurring approximately every other day with no visual changes or nausea reported.  Has to take extra strength excedrin. Has a history of ocular migraines, but this does not feel anything like this. Patient was sick over Christmas, but this has just been in the last 1-2 weeks.    Past Medical History:  Diagnosis Date   Anxiety disorder 07/19/2019   Chronic back pain 03/10/2018   Depression    Dyslipidemia 07/28/2019   Dyspnea on exertion 03/19/2019   Essential hypertension 03/19/2019   History of ovarian cancer    Hyperlipidemia 04/2015   Hypertension 02/2019   Hypothyroidism 03/10/2018   Impingement syndrome of right shoulder 05/12/2014   Insomnia due to other mental disorder 07/19/2019   Lumbar back pain 07/20/2019   Mixed hyperlipidemia 03/19/2019   Right shoulder pain 05/12/2014   Sciatic nerve pain, left 07/19/2019   Sciatic nerve pain, right 07/19/2019    Past Surgical History:  Procedure Laterality Date   ABDOMINAL HYSTERECTOMY  1986   Total, also had chemo   [place]   To rule out recurrent ovarian cancer   TONSILLECTOMY      Family History  Problem Relation Age of Onset   Congestive Heart Failure Mother    Osteoarthritis Mother     Social History   Socioeconomic History   Marital status: Married    Spouse name: Timmothy Sours   Number of children: Not on file   Years of education: Not on file   Highest education level: Not on file  Occupational History   Not on file  Tobacco Use   Smoking status: Never   Smokeless tobacco: Never  Vaping Use   Vaping  Use: Never used  Substance and Sexual Activity   Alcohol use: Yes    Comment: occasionally   Drug use: Not Currently    Types: Solvent inhalants   Sexual activity: Not on file  Other Topics Concern   Not on file  Social History Narrative   Not on file   Social Determinants of Health   Financial Resource Strain: Not on file  Food Insecurity: Not on file  Transportation Needs: Not on file  Physical Activity: Not on file  Stress: Not on file  Social Connections: Not on file  Intimate Partner Violence: Not on file    Outpatient Medications Prior to Visit  Medication Sig Dispense Refill   ALPRAZolam (XANAX) 0.25 MG tablet TAKE 1 TABLET BY MOUTH DAILY AT BEDTIME AS NEEDED FOR SLEEP (Patient taking differently: Take 12.5 mg by mouth at bedtime as needed.) 30 tablet 5   ezetimibe (ZETIA) 10 MG tablet TAKE 1 TABLET BY MOUTH DAILY. (Patient taking differently: Take 5 mg by mouth daily.) 90 tablet 0   hydrochlorothiazide (HYDRODIURIL) 25 MG tablet TAKE 1 TABLET BY MOUTH DAILY 90 tablet 3   levothyroxine (SYNTHROID) 88 MCG tablet TAKE 1 TABLET BY MOUTH ONCE DAILY 90 tablet 0   losartan (COZAAR) 25 MG tablet TAKE 1 TABLET BY MOUTH IN THE MORNING AND AT BEDTIME 180  tablet 0   meloxicam (MOBIC) 7.5 MG tablet Take 1 tablet (7.5 mg total) by mouth 2 (two) times daily as needed for pain. 60 tablet 1   Nebivolol HCl 20 MG TABS TAKE 1 TABLET BY MOUTH ONCE DAILY 30 tablet 0   PREMARIN 0.625 MG tablet Take 1 tablet (0.625 mg total) by mouth daily. 90 tablet 3   No facility-administered medications prior to visit.    Allergies  Allergen Reactions   Lipitor [Atorvastatin]     myalgia   Penicillins Rash    Other reaction(s): RASH    Pravastatin Other (See Comments)    Myalgia    Review of Systems  Constitutional:  Negative for chills, fatigue and fever.  HENT:  Negative for congestion, rhinorrhea and sore throat.   Eyes:  Negative for visual disturbance.  Respiratory:  Positive for cough.  Negative for shortness of breath.   Cardiovascular:  Negative for chest pain.  Gastrointestinal:  Negative for abdominal pain, constipation, diarrhea, nausea and vomiting.  Genitourinary:  Negative for dysuria and urgency.  Musculoskeletal:  Negative for back pain and myalgias.  Neurological:  Positive for headaches. Negative for dizziness, weakness and light-headedness.  Psychiatric/Behavioral:  Negative for dysphoric mood. The patient is not nervous/anxious.       Objective:    Physical Exam Vitals reviewed.  Constitutional:      Appearance: Normal appearance. She is normal weight.  HENT:     Right Ear: Tympanic membrane, ear canal and external ear normal.     Left Ear: Tympanic membrane, ear canal and external ear normal.     Nose: Nose normal.     Mouth/Throat:     Pharynx: Oropharynx is clear.  Cardiovascular:     Rate and Rhythm: Normal rate and regular rhythm.     Heart sounds: Normal heart sounds. No murmur heard. Pulmonary:     Effort: Pulmonary effort is normal. No respiratory distress.     Breath sounds: Normal breath sounds.  Musculoskeletal:     Comments: Nontender over left temple   Lymphadenopathy:     Cervical: No cervical adenopathy.  Neurological:     Mental Status: She is alert and oriented to person, place, and time.     Cranial Nerves: No cranial nerve deficit.  Psychiatric:        Mood and Affect: Mood normal.        Behavior: Behavior normal.    BP 120/70    Pulse 68    Temp (!) 96.8 F (36 C)    Resp 16    Wt 138 lb (62.6 kg)    BMI 24.45 kg/m  Wt Readings from Last 3 Encounters:  03/21/21 138 lb (62.6 kg)  03/12/21 140 lb (63.5 kg)  11/10/20 137 lb (62.1 kg)    Health Maintenance Due  Topic Date Due   Hepatitis C Screening  Never done   Zoster Vaccines- Shingrix (2 of 2) 03/23/2018    There are no preventive care reminders to display for this patient.   Lab Results  Component Value Date   TSH 2.550 11/10/2020   Lab Results   Component Value Date   WBC 5.3 03/12/2021   HGB 13.0 03/12/2021   HCT 38.6 03/12/2021   MCV 94 03/12/2021   PLT 350 03/12/2021   Lab Results  Component Value Date   NA 140 03/12/2021   K 4.7 03/12/2021   CO2 26 03/12/2021   GLUCOSE 105 (H) 03/12/2021   BUN 15 03/12/2021  CREATININE 0.73 03/12/2021   BILITOT 0.3 03/12/2021   ALKPHOS 51 03/12/2021   AST 16 03/12/2021   ALT 8 03/12/2021   PROT 5.8 (L) 03/12/2021   ALBUMIN 4.0 03/12/2021   CALCIUM 9.6 03/12/2021   EGFR 86 03/12/2021   Lab Results  Component Value Date   CHOL 220 (H) 03/12/2021   Lab Results  Component Value Date   HDL 83 03/12/2021   Lab Results  Component Value Date   LDLCALC 124 (H) 03/12/2021   Lab Results  Component Value Date   TRIG 76 03/12/2021   Lab Results  Component Value Date   CHOLHDL 2.7 03/12/2021   Lab Results  Component Value Date   HGBA1C 5.8 (H) 03/12/2021       Assessment & Plan:   Problem List Items Addressed This Visit       Other   Primary cough headache - Primary    May continue use of tylenol      Relevant Orders   MR Brain W Wo Contrast   No orders of the defined types were placed in this encounter.   Orders Placed This Encounter  Procedures   MR Brain W Wo Contrast     Follow-up: No follow-ups on file.  An After Visit Summary was printed and given to the patient.  Rochel Brome, MD [facility] [phone]

## 2021-03-21 NOTE — Assessment & Plan Note (Signed)
May continue use of tylenol

## 2021-03-22 ENCOUNTER — Ambulatory Visit (HOSPITAL_COMMUNITY)
Admission: RE | Admit: 2021-03-22 | Discharge: 2021-03-22 | Disposition: A | Discharge status: Home / Self Care | Payer: Medicare Other | Source: Ambulatory Visit | Attending: Family Medicine | Admitting: Family Medicine

## 2021-03-22 DIAGNOSIS — G4483 Primary cough headache: Secondary | ICD-10-CM | POA: Diagnosis not present

## 2021-03-22 DIAGNOSIS — R519 Headache, unspecified: Secondary | ICD-10-CM | POA: Diagnosis not present

## 2021-03-22 MED ORDER — GADOBUTROL 1 MMOL/ML IV SOLN
6.0000 mL | Freq: Once | INTRAVENOUS | Status: AC | PRN
  Administered 2021-03-22: 6 mL via INTRAVENOUS

## 2021-03-23 ENCOUNTER — Other Ambulatory Visit: Payer: Self-pay | Admitting: Family Medicine

## 2021-03-23 MED ORDER — BENZONATATE 200 MG PO CAPS: 200.0000 mg | ORAL_CAPSULE | Freq: Two times a day (BID) | ORAL | 0 refills | Status: DC | PRN

## 2021-03-23 NOTE — Progress Notes (Signed)
Sent tessalon perles. 

## 2021-04-05 ENCOUNTER — Other Ambulatory Visit: Payer: Self-pay | Admitting: Family Medicine

## 2021-04-17 ENCOUNTER — Other Ambulatory Visit: Payer: Self-pay

## 2021-04-17 ENCOUNTER — Telehealth: Payer: Self-pay

## 2021-04-17 MED ORDER — PREDNISONE 50 MG PO TABS: ORAL_TABLET | ORAL | 0 refills | Status: DC

## 2021-04-17 NOTE — Telephone Encounter (Signed)
Patient called stated she is still having headaches twice a day and she currently taking naproxen, but does not seem to help much. Wanted to know if she could get something called in for headaches or does she need to make an appointment.

## 2021-04-25 ENCOUNTER — Other Ambulatory Visit: Payer: Self-pay | Admitting: Family Medicine

## 2021-04-25 DIAGNOSIS — I10 Essential (primary) hypertension: Secondary | ICD-10-CM

## 2021-04-26 NOTE — Telephone Encounter (Signed)
Refill sent to pharmacy.   

## 2021-05-15 ENCOUNTER — Other Ambulatory Visit: Payer: Self-pay | Admitting: Cardiology

## 2021-05-21 ENCOUNTER — Ambulatory Visit (INDEPENDENT_AMBULATORY_CARE_PROVIDER_SITE_OTHER): Payer: Medicare Other | Admitting: Family Medicine

## 2021-05-21 ENCOUNTER — Encounter: Payer: Self-pay | Admitting: Family Medicine

## 2021-05-21 ENCOUNTER — Other Ambulatory Visit: Payer: Self-pay

## 2021-05-21 VITALS — BP 110/60 | HR 72 | Temp 96.7°F | Resp 14 | Ht 64.0 in | Wt 140.0 lb

## 2021-05-21 DIAGNOSIS — G4459 Other complicated headache syndrome: Secondary | ICD-10-CM | POA: Insufficient documentation

## 2021-05-21 DIAGNOSIS — G43109 Migraine with aura, not intractable, without status migrainosus: Secondary | ICD-10-CM | POA: Diagnosis not present

## 2021-05-21 DIAGNOSIS — G43019 Migraine without aura, intractable, without status migrainosus: Secondary | ICD-10-CM | POA: Insufficient documentation

## 2021-05-21 LAB — SEDIMENTATION RATE: Sed Rate: 30 mm/hr (ref 0–40)

## 2021-05-21 MED ORDER — NURTEC 75 MG PO TBDP: 75.0000 mg | ORAL_TABLET | Freq: Every day | ORAL | 0 refills | Status: DC | PRN

## 2021-05-21 MED ORDER — TOPIRAMATE 25 MG PO TABS: ORAL_TABLET | ORAL | 0 refills | Status: DC

## 2021-05-21 NOTE — Patient Instructions (Signed)
Start topamax 25 mg once daily at night x 1 week, then increase to 2 at night.  ?Take one nurtec at onset of migraine. No more than one per day. (#4 sample pills given.) ?Call back if nurtec works and you would like a prescription.  ?Call Dr. Herbert Deaner to get an eye appointment this week or next week. ?

## 2021-05-21 NOTE — Progress Notes (Signed)
? ?Acute Office Visit ? ?Subjective:  ? ? Patient ID: Dana [name], female    DOB: Apr 13, 1945, 76 y.o.   MRN: [id number] ? ?Chief Complaint  ?Patient presents with  ? Headache  ? ? ?HPI: ?Patient is in today for complaints of daily headaches which started in December.  Excedrin migraine: helps headaches, but upsets stomach. No visual problems. Due for eye exam soon. No change in vision. ?Headaches happen 1-2 per day. Has history of ocular migraines, but has not had one in the fall. Lasts for 10 minutes, but never gets headaches.  ?Tried claritin, but did not help.  ?No nausea, vomiting. No auras. Marland Kitchen  ?Headaches affect her sleep.  ?Increased pressure in head  due to coughing, sneezing, bending can trigger a headache, but also can just come on.  Photophobia.  ? ?Past Medical History:  ?Diagnosis Date  ? Anxiety disorder 07/19/2019  ? Chronic back pain 03/10/2018  ? Depression   ? Dyslipidemia 07/28/2019  ? Dyspnea on exertion 03/19/2019  ? Essential hypertension 03/19/2019  ? History of ovarian cancer   ? Hyperlipidemia 04/2015  ? Hypertension 02/2019  ? Hypothyroidism 03/10/2018  ? Impingement syndrome of right shoulder 05/12/2014  ? Insomnia due to other mental disorder 07/19/2019  ? Lumbar back pain 07/20/2019  ? Mixed hyperlipidemia 03/19/2019  ? Right shoulder pain 05/12/2014  ? Sciatic nerve pain, left 07/19/2019  ? Sciatic nerve pain, right 07/19/2019  ? ? ?Past Surgical History:  ?Procedure Laterality Date  ? ABDOMINAL HYSTERECTOMY  1986  ? Total, also had chemo  ? APPENDECTOMY  1986  ? LAPAROTOMY  1987  ? To rule out recurrent ovarian cancer  ? TONSILLECTOMY    ? ? ?Family History  ?Problem Relation Age of Onset  ? Congestive Heart Failure Mother   ? Osteoarthritis Mother   ? ? ?Social History  ? ?Socioeconomic History  ? Marital status: Married  ?  Spouse name: Timmothy Sours  ? Number of children: Not on file  ? Years of education: Not on file  ? Highest education level: Not on file  ?Occupational History  ? Not on file  ?Tobacco  Use  ? Smoking status: Never  ? Smokeless tobacco: Never  ?Vaping Use  ? Vaping Use: Never used  ?Substance and Sexual Activity  ? Alcohol use: Yes  ?  Comment: occasionally  ? Drug use: Not Currently  ?  Types: Solvent inhalants  ? Sexual activity: Not on file  ?Other Topics Concern  ? Not on file  ?Social History Narrative  ? Not on file  ? ?Social Determinants of Health  ? ?Financial Resource Strain: Not on file  ?Food Insecurity: Not on file  ?Transportation Needs: Not on file  ?Physical Activity: Not on file  ?Stress: Not on file  ?Social Connections: Not on file  ?Intimate Partner Violence: Not on file  ? ? ?Outpatient Medications Prior to Visit  ?Medication Sig Dispense Refill  ? ALPRAZolam (XANAX) 0.25 MG tablet TAKE 1 TABLET BY MOUTH DAILY AT BEDTIME AS NEEDED FOR SLEEP (Patient taking differently: Take 12.5 mg by mouth at bedtime as needed.) 30 tablet 5  ? hydrochlorothiazide (HYDRODIURIL) 25 MG tablet TAKE 1 TABLET BY MOUTH DAILY 90 tablet 3  ? levothyroxine (SYNTHROID) 88 MCG tablet TAKE 1 TABLET BY MOUTH ONCE DAILY 90 tablet 0  ? losartan (COZAAR) 25 MG tablet TAKE 1 TABLET BY MOUTH IN THE MORNING AND AT BEDTIME 180 tablet 0  ? meloxicam (MOBIC) 7.5 MG tablet Take  1 tablet (7.5 mg total) by mouth 2 (two) times daily as needed for pain. 60 tablet 1  ? Nebivolol HCl 20 MG TABS Take 1 tablet (20 mg total) by mouth daily. Patient must keep appointment for 06/04/21 for further refills. 3 rd/final attempt 20 tablet 0  ? PREMARIN 0.625 MG tablet Take 1 tablet (0.625 mg total) by mouth daily. 90 tablet 3  ? ezetimibe (ZETIA) 10 MG tablet TAKE 1 TABLET BY MOUTH DAILY. (Patient taking differently: Take 5 mg by mouth daily.) 90 tablet 0  ? benzonatate (TESSALON) 200 MG capsule Take 1 capsule (200 mg total) by mouth 2 (two) times daily as needed for cough. 30 capsule 0  ? predniSONE (DELTASONE) 50 MG tablet Take 1 tablet by mouth daily for 5 days. 5 tablet 0  ? ?No facility-administered medications prior to visit.   ? ? ?Allergies  ?Allergen Reactions  ? Lipitor [Atorvastatin]   ?  myalgia  ? Penicillins Rash  ?  Other reaction(s): RASH ?  ? Pravastatin Other (See Comments)  ?  Myalgia  ? ? ?Review of Systems  ?Constitutional:  Positive for fatigue. Negative for chills and fever.  ?HENT:  Negative for congestion, ear pain, sinus pressure and sore throat.   ?Respiratory:  Negative for cough and shortness of breath.   ?Cardiovascular:  Negative for chest pain.  ?Gastrointestinal:  Negative for nausea.  ?Neurological:  Positive for headaches.  ?Psychiatric/Behavioral:  Positive for sleep disturbance.   ? ?   ?Objective:  ?  ?Physical Exam ?Vitals reviewed.  ?Constitutional:   ?   Appearance: Normal appearance. She is well-developed.  ?HENT:  ?   Right Ear: Tympanic membrane, ear canal and external ear normal.  ?   Left Ear: Tympanic membrane, ear canal and external ear normal.  ?   Nose: Nose normal.  ?   Mouth/Throat:  ?   Pharynx: Oropharynx is clear.  ?Cardiovascular:  ?   Rate and Rhythm: Normal rate and regular rhythm.  ?   Heart sounds: Normal heart sounds. No murmur heard. ?Pulmonary:  ?   Effort: Pulmonary effort is normal. No respiratory distress.  ?   Breath sounds: Normal breath sounds.  ?Lymphadenopathy:  ?   Cervical: No cervical adenopathy.  ?Neurological:  ?   Mental Status: She is alert and oriented to person, place, and time.  ?   Cranial Nerves: No cranial nerve deficit.  ?Psychiatric:     ?   Mood and Affect: Mood normal.     ?   Behavior: Behavior normal.  ?No tenderness on temple.  ? ?BP 110/60   Pulse 72   Temp (!) 96.7 ?F (35.9 ?C)   Resp 14   Ht '5\' 4"'  (1.626 m)   Wt 140 lb (63.5 kg)   BMI 24.03 kg/m?  ?Wt Readings from Last 3 Encounters:  ?05/21/21 140 lb (63.5 kg)  ?03/21/21 138 lb (62.6 kg)  ?03/12/21 140 lb (63.5 kg)  ? ? ?Health Maintenance Due  ?Topic Date Due  ? Hepatitis C Screening  Never done  ? Zoster Vaccines- Shingrix (2 of 2) 03/23/2018  ? ? ?There are no preventive care reminders to  display for this patient. ? ? ?Lab Results  ?Component Value Date  ? TSH 2.550 11/10/2020  ? ?Lab Results  ?Component Value Date  ? WBC 5.3 03/12/2021  ? HGB 13.0 03/12/2021  ? HCT 38.6 03/12/2021  ? MCV 94 03/12/2021  ? PLT 350 03/12/2021  ? ?Lab Results  ?  Component Value Date  ? NA 140 03/12/2021  ? K 4.7 03/12/2021  ? CO2 26 03/12/2021  ? GLUCOSE 105 (H) 03/12/2021  ? BUN 15 03/12/2021  ? CREATININE 0.73 03/12/2021  ? BILITOT 0.3 03/12/2021  ? ALKPHOS 51 03/12/2021  ? AST 16 03/12/2021  ? ALT 8 03/12/2021  ? PROT 5.8 (L) 03/12/2021  ? ALBUMIN 4.0 03/12/2021  ? CALCIUM 9.6 03/12/2021  ? EGFR 86 03/12/2021  ? ?Lab Results  ?Component Value Date  ? CHOL 220 (H) 03/12/2021  ? ?Lab Results  ?Component Value Date  ? HDL 83 03/12/2021  ? ?Lab Results  ?Component Value Date  ? LDLCALC 124 (H) 03/12/2021  ? ?Lab Results  ?Component Value Date  ? TRIG 76 03/12/2021  ? ?Lab Results  ?Component Value Date  ? CHOLHDL 2.7 03/12/2021  ? ?Lab Results  ?Component Value Date  ? HGBA1C 5.8 (H) 03/12/2021  ? ? ?   ?Assessment & Plan:  ? ?Problem List Items Addressed This Visit   ? ?  ? Cardiovascular and Mediastinum  ? Ocular migraine  ?  Follow up with Dr. Herbert Deaner. ?  ?  ? Relevant Medications  ? topiramate (TOPAMAX) 25 MG tablet  ? Rimegepant Sulfate (NURTEC) 75 MG TBDP  ? Migraine without aura, intractable, without status migrainosus  ?  Start topamax 25 mg once daily at night x 1 week, then increase to 2 at night.  ?Take one nurtec at onset of migraine. No more than one per day. (#4 sample pills given.) ?Call back if nurtec works and you would like a prescription.  ?Call Dr. Herbert Deaner to get an eye appointment this week or next week. ?  ?  ? Relevant Medications  ? topiramate (TOPAMAX) 25 MG tablet  ? Rimegepant Sulfate (NURTEC) 75 MG TBDP  ?  ? Other  ? Other complicated headache syndrome - Primary  ?  Start topamax 25 mg once daily at night x 1 week, then increase to 2 at night.  ? ?  ?  ? Relevant Medications  ? topiramate  (TOPAMAX) 25 MG tablet  ? Rimegepant Sulfate (NURTEC) 75 MG TBDP  ? Other Relevant Orders  ? Sedimentation rate (Completed)  ? ?Meds ordered this encounter  ?Medications  ? topiramate (TOPAMAX) 25 MG tablet  ?  Sig:

## 2021-05-22 ENCOUNTER — Telehealth: Payer: Self-pay

## 2021-05-22 NOTE — Telephone Encounter (Signed)
Patient notified of normal sed rate.  She started her new medication today as advised.   ?

## 2021-05-23 ENCOUNTER — Other Ambulatory Visit: Payer: Self-pay | Admitting: Family Medicine

## 2021-05-23 NOTE — Telephone Encounter (Signed)
Refill sent to pharmacy.   

## 2021-05-27 NOTE — Assessment & Plan Note (Signed)
Follow up with Dr. Herbert Deaner. ?

## 2021-05-27 NOTE — Assessment & Plan Note (Signed)
Start topamax 25 mg once daily at night x 1 week, then increase to 2 at night.  ? ?

## 2021-05-27 NOTE — Assessment & Plan Note (Signed)
Start topamax 25 mg once daily at night x 1 week, then increase to 2 at night.  ?Take one nurtec at onset of migraine. No more than one per day. (#4 sample pills given.) ?Call back if nurtec works and you would like a prescription.  ?Call Dr. Herbert Deaner to get an eye appointment this week or next week. ?

## 2021-05-31 ENCOUNTER — Encounter: Payer: Self-pay | Admitting: Neurology

## 2021-05-31 ENCOUNTER — Telehealth: Payer: Self-pay

## 2021-05-31 DIAGNOSIS — G43109 Migraine with aura, not intractable, without status migrainosus: Secondary | ICD-10-CM

## 2021-05-31 DIAGNOSIS — G43019 Migraine without aura, intractable, without status migrainosus: Secondary | ICD-10-CM

## 2021-05-31 NOTE — Telephone Encounter (Signed)
Complaining of what she feels is side effects from topamax. Currently taking 25 mg twice daily. No energy, BP has decreased, constipation, loss of sleep, weakness. BP this morning 90/54, she was dizzy and weak. Is continuing to have headaches but not as severe. Did recheck while on phone, resulted 120/72, not currently feeling weak or dizzy. Between BP checks she elevated feet and rested in bed.  ? ?Callback: [phone]  ? ?Harrell Lark 05/31/21 10:41 AM ? ? ?Called patient. Patient will stop topamax, take tylenol sparingly for headaches. We will refer to neurology, she does not have preference. She will continue to monitor symptoms and BP. Plan advised by provider. Patient VU of plan.  ? ?Referral placed.   ?Harrell Lark 05/31/21 12:20 PM ? ? ?

## 2021-06-04 ENCOUNTER — Ambulatory Visit: Payer: Medicare Other | Admitting: Cardiology

## 2021-06-04 ENCOUNTER — Encounter: Payer: Self-pay | Admitting: Cardiology

## 2021-06-04 DIAGNOSIS — I10 Essential (primary) hypertension: Secondary | ICD-10-CM | POA: Diagnosis not present

## 2021-06-04 DIAGNOSIS — G43109 Migraine with aura, not intractable, without status migrainosus: Secondary | ICD-10-CM | POA: Diagnosis not present

## 2021-06-04 DIAGNOSIS — E785 Hyperlipidemia, unspecified: Secondary | ICD-10-CM | POA: Diagnosis not present

## 2021-06-04 NOTE — Patient Instructions (Signed)

## 2021-06-04 NOTE — Progress Notes (Signed)
?Cardiology Office Note:   ? ?Date:  06/04/2021  ? ?ID:  Dana [name], DOB [birth year], MRN [id number] ? ?PCP:  Rochel Brome, MD  ?Cardiologist:  Jenne Campus, MD   ? ?Referring MD: Rochel Brome, MD  ? ?Chief Complaint  ?Patient presents with  ? Follow-up  ? ? ?History of Present Illness:   ? ?Dana [name] is a 76 y.o. female with past medical history significant for essential hypertension which was difficult to control, dyslipidemia, hypothyroidism now she does have cluster headaches.  She is coming today to my office for follow-up.  Overall she is doing very well.  She still exercise on the regular basis walking between 3 and 5 times a week for about 45 minutes.  She denies have any chest pain tightness squeezing pressure burning chest but the biggest problem is cluster headaches that she is getting.  Then bothers her a lot. ? ?Past Medical History:  ?Diagnosis Date  ? Anxiety disorder 07/19/2019  ? Chronic back pain 03/10/2018  ? Depression   ? Dyslipidemia 07/28/2019  ? Dyspnea on exertion 03/19/2019  ? Essential hypertension 03/19/2019  ? History of ovarian cancer   ? Hyperlipidemia 04/2015  ? Hypertension 02/2019  ? Hypothyroidism 03/10/2018  ? Impingement syndrome of right shoulder 05/12/2014  ? Insomnia due to other mental disorder 07/19/2019  ? Lumbar back pain 07/20/2019  ? Mixed hyperlipidemia 03/19/2019  ? Right shoulder pain 05/12/2014  ? Sciatic nerve pain, left 07/19/2019  ? Sciatic nerve pain, right 07/19/2019  ? ? ?Past Surgical History:  ?Procedure Laterality Date  ? ABDOMINAL HYSTERECTOMY  1986  ? Total, also had chemo  ? APPENDECTOMY  1986  ? LAPAROTOMY  1987  ? To rule out recurrent ovarian cancer  ? TONSILLECTOMY    ? ? ?Current Medications: ?Current Meds  ?Medication Sig  ? ALPRAZolam (XANAX) 0.25 MG tablet TAKE 1 TABLET BY MOUTH DAILY AT BEDTIME AS NEEDED FOR SLEEP (Patient taking differently: Take 12.5 mg by mouth at bedtime as needed for anxiety.)  ? cetirizine (ZYRTEC) 10 MG tablet Take 10 mg by  mouth daily.  ? ezetimibe (ZETIA) 10 MG tablet TAKE 1 TABLET BY MOUTH DAILY. (Patient taking differently: Take 10 mg by mouth daily.)  ? hydrochlorothiazide (HYDRODIURIL) 25 MG tablet TAKE 1 TABLET BY MOUTH DAILY (Patient taking differently: Take 25 mg by mouth daily.)  ? levothyroxine (SYNTHROID) 88 MCG tablet TAKE 1 TABLET BY MOUTH ONCE DAILY (Patient taking differently: Take 88 mcg by mouth daily before breakfast.)  ? losartan (COZAAR) 25 MG tablet TAKE 1 TABLET BY MOUTH IN THE MORNING AND AT BEDTIME (Patient taking differently: Take 25 mg by mouth 2 (two) times daily.)  ? meloxicam (MOBIC) 7.5 MG tablet Take 1 tablet (7.5 mg total) by mouth 2 (two) times daily as needed for pain.  ? Nebivolol HCl 20 MG TABS Take 1 tablet (20 mg total) by mouth daily. Patient must keep appointment for 06/04/21 for further refills. 3 rd/final attempt  ? PREMARIN 0.625 MG tablet Take 1 tablet (0.625 mg total) by mouth daily.  ?  ? ?Allergies:   Lipitor [atorvastatin], Topamax [topiramate], Penicillins, and Pravastatin  ? ?Social History  ? ?Socioeconomic History  ? Marital status: Married  ?  Spouse name: Timmothy Sours  ? Number of children: Not on file  ? Years of education: Not on file  ? Highest education level: Not on file  ?Occupational History  ? Not on file  ?Tobacco Use  ? Smoking status: Never  ?  Smokeless tobacco: Never  ?Vaping Use  ? Vaping Use: Never used  ?Substance and Sexual Activity  ? Alcohol use: Yes  ?  Comment: occasionally  ? Drug use: Not Currently  ?  Types: Solvent inhalants  ? Sexual activity: Not on file  ?Other Topics Concern  ? Not on file  ?Social History Narrative  ? Not on file  ? ?Social Determinants of Health  ? ?Financial Resource Strain: Not on file  ?Food Insecurity: Not on file  ?Transportation Needs: Not on file  ?Physical Activity: Not on file  ?Stress: Not on file  ?Social Connections: Not on file  ?  ? ?Family History: ?The patient's family history includes Congestive Heart Failure in her mother;  Osteoarthritis in her mother. ?ROS:   ?Please see the history of present illness.    ?All 14 point review of systems negative except as described per history of present illness ? ?EKGs/Labs/Other Studies Reviewed:   ? ? ? ?Recent Labs: ?11/10/2020: TSH 2.550 ?03/12/2021: ALT 8; BUN 15; Creatinine, Ser 0.73; Hemoglobin 13.0; Platelets 350; Potassium 4.7; Sodium 140  ?Recent Lipid Panel ?   ?Component Value Date/Time  ? CHOL 220 (H) 03/12/2021 0930  ? TRIG 76 03/12/2021 0930  ? HDL 83 03/12/2021 0930  ? CHOLHDL 2.7 03/12/2021 0930  ? LDLCALC 124 (H) 03/12/2021 0930  ? ? ?Physical Exam:   ? ?VS:  BP 134/72 (BP Location: Left Arm, Patient Position: Sitting)   Pulse 60   Ht 5' 3.5" (1.613 m)   Wt 134 lb 6.4 oz (61 kg)   SpO2 98%   BMI 23.43 kg/m?    ? ?Wt Readings from Last 3 Encounters:  ?06/04/21 134 lb 6.4 oz (61 kg)  ?05/21/21 140 lb (63.5 kg)  ?03/21/21 138 lb (62.6 kg)  ?  ? ?GEN:  Well nourished, well developed in no acute distress ?HEENT: Normal ?NECK: No JVD; No carotid bruits ?LYMPHATICS: No lymphadenopathy ?CARDIAC: RRR, no murmurs, no rubs, no gallops ?RESPIRATORY:  Clear to auscultation without rales, wheezing or rhonchi  ?ABDOMEN: Soft, non-tender, non-distended ?MUSCULOSKELETAL:  No edema; No deformity  ?SKIN: Warm and dry ?LOWER EXTREMITIES: no swelling ?NEUROLOGIC:  Alert and oriented x 3 ?PSYCHIATRIC:  Normal affect  ? ?ASSESSMENT:   ? ?1. Essential hypertension   ?2. Ocular migraine   ?3. Dyslipidemia   ? ?PLAN:   ? ?In order of problems listed above: ? ?Essential hypertension blood pressure well controlled we will continue present management. ?Ocular migraine that being followed by internal medicine team she is waiting for appointment with neurology. ?Dyslipidemia, I did review her K PN data from January with LDL of 124 however HDL is very high 83.  Therefore, we will continue present management which includes only Zetia. ? ? ?Medication Adjustments/Labs and Tests Ordered: ?Current medicines are  reviewed at length with the patient today.  Concerns regarding medicines are outlined above.  ?No orders of the defined types were placed in this encounter. ? ?Medication changes: No orders of the defined types were placed in this encounter. ? ? ?Signed, ?Park Liter, MD, [facility] ?06/04/2021 10:01 AM    ?Ruth ? ?

## 2021-06-12 DIAGNOSIS — L853 Xerosis cutis: Secondary | ICD-10-CM | POA: Diagnosis not present

## 2021-06-12 DIAGNOSIS — L565 Disseminated superficial actinic porokeratosis (DSAP): Secondary | ICD-10-CM | POA: Diagnosis not present

## 2021-06-12 DIAGNOSIS — L570 Actinic keratosis: Secondary | ICD-10-CM | POA: Diagnosis not present

## 2021-06-12 DIAGNOSIS — D3612 Benign neoplasm of peripheral nerves and autonomic nervous system, upper limb, including shoulder: Secondary | ICD-10-CM | POA: Diagnosis not present

## 2021-06-13 ENCOUNTER — Other Ambulatory Visit: Payer: Self-pay | Admitting: Cardiology

## 2021-06-22 ENCOUNTER — Other Ambulatory Visit: Payer: Self-pay | Admitting: Family Medicine

## 2021-06-25 ENCOUNTER — Encounter: Payer: Self-pay | Admitting: Family Medicine

## 2021-06-25 ENCOUNTER — Ambulatory Visit (INDEPENDENT_AMBULATORY_CARE_PROVIDER_SITE_OTHER): Payer: Medicare Other | Admitting: Family Medicine

## 2021-06-25 VITALS — BP 120/80 | HR 56 | Temp 97.9°F

## 2021-06-25 DIAGNOSIS — J301 Allergic rhinitis due to pollen: Secondary | ICD-10-CM

## 2021-06-25 DIAGNOSIS — G43019 Migraine without aura, intractable, without status migrainosus: Secondary | ICD-10-CM | POA: Diagnosis not present

## 2021-06-25 DIAGNOSIS — G4459 Other complicated headache syndrome: Secondary | ICD-10-CM

## 2021-06-25 NOTE — Progress Notes (Signed)
Subjective:  Patient ID: Dana [name], female    DOB: 1945-06-08  Age: 76 y.o. MRN: [id number]  Chief Complaint  Patient presents with   Migraine    HPI Migraine: Patient was seen on 05/21/2021 and she started topamax 25 mg once daily at night x 1 week, then increase to 2 at night, but she had to stop it because her blood pressure went to 82/56. She called to the office and they recommended to stop it. She takes one nurtec at onset of migraine. No more than one per day. (#4 sample pills given.) she ran out. She is not sure if that helps. Patient has been taking zyrtec and she thinks is helping her. Started 2 weeks ago. She denied any dizziness, headache, chest pain, sob. She is concerned about cluster headaches. Headaches are sharp and are accompanied by tearing of unilateral eye and sweating.   Current Outpatient Medications on File Prior to Visit  Medication Sig Dispense Refill   ALPRAZolam (XANAX) 0.25 MG tablet TAKE 1 TABLET BY MOUTH DAILY AT BEDTIME AS NEEDED FOR SLEEP (Patient taking differently: Take 12.5 mg by mouth at bedtime as needed for anxiety.) 30 tablet 5   atorvastatin (LIPITOR) 10 MG tablet SMARTSIG:1 Tablet(s) By Mouth     cetirizine (ZYRTEC) 10 MG tablet Take 10 mg by mouth daily.     EFUDEX 5 % cream Apply topically.     ezetimibe (ZETIA) 10 MG tablet TAKE 1 TABLET BY MOUTH DAILY. (Patient taking differently: Take 10 mg by mouth daily.) 90 tablet 0   hydrochlorothiazide (HYDRODIURIL) 25 MG tablet TAKE 1 TABLET BY MOUTH DAILY (Patient taking differently: Take 25 mg by mouth daily.) 90 tablet 3   losartan (COZAAR) 25 MG tablet TAKE 1 TABLET BY MOUTH IN THE MORNING AND AT BEDTIME (Patient taking differently: Take 25 mg by mouth 2 (two) times daily.) 180 tablet 0   meloxicam (MOBIC) 7.5 MG tablet Take 1 tablet (7.5 mg total) by mouth 2 (two) times daily as needed for pain. 60 tablet 1   Nebivolol HCl 20 MG TABS Take 1 tablet (20 mg total) by mouth daily. 90 tablet 3    PREMARIN 0.625 MG tablet Take 1 tablet (0.625 mg total) by mouth daily. 90 tablet 3   No current facility-administered medications on file prior to visit.   Past Medical History:  Diagnosis Date   Anxiety disorder 07/19/2019   Chronic back pain 03/10/2018   Depression    Dyslipidemia 07/28/2019   Dyspnea on exertion 03/19/2019   Essential hypertension 03/19/2019   History of ovarian cancer    Hyperlipidemia 04/2015   Hypertension 02/2019   Hypothyroidism 03/10/2018   Impingement syndrome of right shoulder 05/12/2014   Insomnia due to other mental disorder 07/19/2019   Lumbar back pain 07/20/2019   Mixed hyperlipidemia 03/19/2019   Right shoulder pain 05/12/2014   Sciatic nerve pain, left 07/19/2019   Sciatic nerve pain, right 07/19/2019   Past Surgical History:  Procedure Laterality Date   ABDOMINAL HYSTERECTOMY  1986   Total, also had chemo   [place]   To rule out recurrent ovarian cancer   TONSILLECTOMY      Family History  Problem Relation Age of Onset   Congestive Heart Failure Mother    Osteoarthritis Mother    Social History   Socioeconomic History   Marital status: Married    Spouse name: Timmothy Sours   Number of children: Not on file  Years of education: Not on file   Highest education level: Not on file  Occupational History   Not on file  Tobacco Use   Smoking status: Never   Smokeless tobacco: Never  Vaping Use   Vaping Use: Never used  Substance and Sexual Activity   Alcohol use: Yes    Alcohol/week: 1.0 standard drink    Types: 1 Glasses of wine per week    Comment: occasionally   Drug use: Not Currently    Types: Solvent inhalants   Sexual activity: Yes    Partners: Male  Other Topics Concern   Not on file  Social History Narrative   Not on file   Social Determinants of Health   Financial Resource Strain: Not on file  Food Insecurity: Not on file  Transportation Needs: Not on file  Physical Activity: Not on file  Stress:  Not on file  Social Connections: Not on file    Review of Systems  Constitutional:  Negative for chills, fatigue and fever.  HENT:  Negative for congestion, ear pain and sore throat.   Respiratory:  Negative for cough and shortness of breath.   Cardiovascular:  Negative for chest pain and palpitations.  Gastrointestinal:  Negative for abdominal pain, constipation, diarrhea, nausea and vomiting.  Endocrine: Negative for polydipsia, polyphagia and polyuria.  Genitourinary:  Negative for difficulty urinating and dysuria.  Musculoskeletal:  Negative for arthralgias, back pain and myalgias.  Skin:  Negative for rash.  Neurological:  Positive for headaches. Negative for dizziness.  Psychiatric/Behavioral:  Negative for dysphoric mood. The patient is not nervous/anxious.     Objective:  BP 120/80   Pulse (!) 56   Temp 97.9 F (36.6 C)   LMP  (LMP Unknown)   SpO2 98%      06/25/2021   11:22 AM 06/04/2021    9:30 AM 05/21/2021    8:39 AM  BP/Weight  Systolic BP [phone]  Diastolic BP 80 72 60  Wt. (Lbs)  134.4 140  BMI  23.43 kg/m2 24.03 kg/m2    Physical Exam Vitals reviewed.  Constitutional:      Appearance: Normal appearance.  HENT:     Right Ear: Tympanic membrane, ear canal and external ear normal.     Left Ear: Tympanic membrane, ear canal and external ear normal.     Nose: Congestion (pale, swollen turbintes.) and rhinorrhea present.     Mouth/Throat:     Pharynx: Oropharynx is clear.  Cardiovascular:     Rate and Rhythm: Normal rate and regular rhythm.     Heart sounds: Normal heart sounds. No murmur heard. Pulmonary:     Effort: Pulmonary effort is normal. No respiratory distress.     Breath sounds: Normal breath sounds.  Neurological:     Mental Status: She is alert and oriented to person, place, and time.  Psychiatric:        Mood and Affect: Mood normal.        Behavior: Behavior normal.    Diabetic Foot Exam - Simple   No data filed      Lab  Results  Component Value Date   WBC 5.3 03/12/2021   HGB 13.0 03/12/2021   HCT 38.6 03/12/2021   PLT 350 03/12/2021   GLUCOSE 105 (H) 03/12/2021   CHOL 220 (H) 03/12/2021   TRIG 76 03/12/2021   HDL 83 03/12/2021   LDLCALC 124 (H) 03/12/2021   ALT 8 03/12/2021   AST 16 03/12/2021   NA  140 03/12/2021   K 4.7 03/12/2021   CL 102 03/12/2021   CREATININE 0.73 03/12/2021   BUN 15 03/12/2021   CO2 26 03/12/2021   TSH 2.550 11/10/2020   HGBA1C 5.8 (H) 03/12/2021      Assessment & Plan:   Problem List Items Addressed This Visit       Cardiovascular and Mediastinum   Migraine without aura, intractable, without status migrainosus    Intolerant to topamax.         Relevant Medications   atorvastatin (LIPITOR) 10 MG tablet     Respiratory   Non-seasonal allergic rhinitis due to pollen    Start on zyrtec 10 mg before bed.          Other   Other complicated headache syndrome - Primary    Intolerant to topamax  Concerning for cluster headaches  If zyrtec does not work, will recommend neuro referral.       .   Follow-up: Return in about 2 months (around 08/25/2021) for chronic follow up.  An After Visit Summary was printed and given to the patient.  Rochel Brome, MD [facility] [phone]

## 2021-07-03 ENCOUNTER — Other Ambulatory Visit: Payer: Self-pay | Admitting: Family Medicine

## 2021-07-03 NOTE — Telephone Encounter (Signed)
Refill sent to pharmacy.   

## 2021-07-08 DIAGNOSIS — J301 Allergic rhinitis due to pollen: Secondary | ICD-10-CM | POA: Insufficient documentation

## 2021-07-08 NOTE — Assessment & Plan Note (Signed)
Intolerant to topamax.  ? ?

## 2021-07-08 NOTE — Assessment & Plan Note (Addendum)
Intolerant to topamax  Concerning for cluster headaches  If zyrtec does not work, will recommend neuro referral.

## 2021-07-08 NOTE — Assessment & Plan Note (Signed)
Start on zyrtec 10 mg before bed.  ?

## 2021-07-24 ENCOUNTER — Other Ambulatory Visit: Payer: Self-pay | Admitting: Family Medicine

## 2021-07-24 DIAGNOSIS — I10 Essential (primary) hypertension: Secondary | ICD-10-CM

## 2021-08-23 DIAGNOSIS — H35363 Drusen (degenerative) of macula, bilateral: Secondary | ICD-10-CM | POA: Diagnosis not present

## 2021-08-23 DIAGNOSIS — H35033 Hypertensive retinopathy, bilateral: Secondary | ICD-10-CM | POA: Diagnosis not present

## 2021-08-23 DIAGNOSIS — H1045 Other chronic allergic conjunctivitis: Secondary | ICD-10-CM | POA: Diagnosis not present

## 2021-08-23 DIAGNOSIS — H40013 Open angle with borderline findings, low risk, bilateral: Secondary | ICD-10-CM | POA: Diagnosis not present

## 2021-08-24 ENCOUNTER — Other Ambulatory Visit: Payer: Self-pay | Admitting: Family Medicine

## 2021-09-05 DIAGNOSIS — Z1231 Encounter for screening mammogram for malignant neoplasm of breast: Secondary | ICD-10-CM | POA: Diagnosis not present

## 2021-09-05 LAB — HM MAMMOGRAPHY

## 2021-09-06 ENCOUNTER — Encounter: Payer: Self-pay | Admitting: Family Medicine

## 2021-09-10 ENCOUNTER — Ambulatory Visit (INDEPENDENT_AMBULATORY_CARE_PROVIDER_SITE_OTHER): Payer: Medicare Other | Admitting: Family Medicine

## 2021-09-10 ENCOUNTER — Encounter: Payer: Self-pay | Admitting: Family Medicine

## 2021-09-10 VITALS — BP 112/72 | HR 54 | Temp 97.5°F | Ht 63.5 in | Wt 141.0 lb

## 2021-09-10 DIAGNOSIS — I10 Essential (primary) hypertension: Secondary | ICD-10-CM | POA: Diagnosis not present

## 2021-09-10 DIAGNOSIS — F419 Anxiety disorder, unspecified: Secondary | ICD-10-CM

## 2021-09-10 DIAGNOSIS — E782 Mixed hyperlipidemia: Secondary | ICD-10-CM

## 2021-09-10 DIAGNOSIS — E034 Atrophy of thyroid (acquired): Secondary | ICD-10-CM

## 2021-09-10 DIAGNOSIS — R739 Hyperglycemia, unspecified: Secondary | ICD-10-CM | POA: Diagnosis not present

## 2021-09-10 NOTE — Assessment & Plan Note (Signed)
The current medical regimen is effective;  continue present plan and medications.  

## 2021-09-10 NOTE — Assessment & Plan Note (Signed)
Well controlled.  No changes to medicines. Continue Losartan '25mg'$  take 1 tablet BID, HCTZ '25mg'$  take 1 tablet daily, Nebivolol HCL '20mg'$  take one tablet daily. Continue to work on eating a healthy diet and exercise.  Labs drawn today.

## 2021-09-10 NOTE — Assessment & Plan Note (Signed)
Previously well controlled Continue Synthroid at current dose  Recheck TSH and adjust Synthroid as indicated   

## 2021-09-10 NOTE — Progress Notes (Signed)
Subjective:  Patient ID: Dana [name], female    DOB: 1945/05/29  Age: 76 y.o. MRN: [id number]  Chief Complaint  Patient presents with   Hyperlipidemia   Hypertension   Hypothyroidism   HPI: Hyperlipidemia: Current medications: Zetia '10mg'$  taking one tablet daily, Atorvastatin 10 mg take 1 tablet daily. Tolerating well.   Hypertension: Current medications: Losartan '25mg'$  take 1 tablet BID, HCTZ '25mg'$  take 1 tablet daily, Nebivolol HCL '20mg'$  take one tablet daily. Bp is great today. Checks a couple times a week  and is good.  Eat healthy.  Exercise: walks some but back hurts.   Hypothyroidism: Current medications: Levothyroxine 45mg take 1 tablet daily.  Anxiety: Current medications: Xanax 0.'25mg'$  take 1/2 tablet daily at night  Patient was having severe cluster headaches and made an appointment in August with Neurology. They have resolved.     Current Outpatient Medications on File Prior to Visit  Medication Sig Dispense Refill   ALPRAZolam (XANAX) 0.25 MG tablet Take 1 tablet (0.25 mg total) by mouth at bedtime as needed for anxiety. 30 tablet 3   atorvastatin (LIPITOR) 10 MG tablet SMARTSIG:1 Tablet(s) By Mouth     cetirizine (ZYRTEC) 10 MG tablet Take 10 mg by mouth daily.     EFUDEX 5 % cream Apply topically.     ezetimibe (ZETIA) 10 MG tablet TAKE 1 TABLET BY MOUTH DAILY. (Patient taking differently: Take 10 mg by mouth daily.) 90 tablet 0   hydrochlorothiazide (HYDRODIURIL) 25 MG tablet TAKE 1 TABLET BY MOUTH DAILY (Patient taking differently: Take 25 mg by mouth daily.) 90 tablet 3   levothyroxine (SYNTHROID) 88 MCG tablet TAKE 1 TABLET BY MOUTH ONCE DAILY 90 tablet 0   losartan (COZAAR) 25 MG tablet TAKE 1 TABLET BY MOUTH IN THE MORNING AND AT BEDTIME 180 tablet 0   meloxicam (MOBIC) 7.5 MG tablet Take 1 tablet (7.5 mg total) by mouth 2 (two) times daily as needed for pain. 60 tablet 1   Nebivolol HCl 20 MG TABS Take 1 tablet (20 mg total) by mouth daily. 90 tablet 3    PREMARIN 0.625 MG tablet Take 1 tablet (0.625 mg total) by mouth daily. 90 tablet 3   No current facility-administered medications on file prior to visit.   Past Medical History:  Diagnosis Date   Anxiety disorder 07/19/2019   Chronic back pain 03/10/2018   Depression    Dyslipidemia 07/28/2019   Dyspnea on exertion 03/19/2019   Essential hypertension 03/19/2019   History of ovarian cancer    Hyperlipidemia 04/2015   Hypertension 02/2019   Hypothyroidism 03/10/2018   Impingement syndrome of right shoulder 05/12/2014   Insomnia due to other mental disorder 07/19/2019   Lumbar back pain 07/20/2019   Mixed hyperlipidemia 03/19/2019   Right shoulder pain 05/12/2014   Sciatic nerve pain, left 07/19/2019   Sciatic nerve pain, right 07/19/2019   Past Surgical History:  Procedure Laterality Date   ABDOMINAL HYSTERECTOMY  1986   [place]  To rule out [place]   TONSILLECTOMY      Family History  Problem Relation Age of Onset   Congestive Heart Failure Mother    Osteoarthritis Mother    Social History   Socioeconomic History   Marital status: Married    Spouse name: DTimmothy Sours  Number of children: Not on file   Years of education: Not on file   Highest education level:  Not on file  Occupational History   Not on file  Tobacco Use   Smoking status: Never   Smokeless tobacco: Never  Vaping Use   Vaping Use: Never used  Substance and Sexual Activity   Alcohol use: Yes    Alcohol/week: 1.0 standard drink of alcohol    Types: 1 Glasses of wine per week    Comment: occasionally   Drug use: Not Currently    Types: Solvent inhalants   Sexual activity: Yes    Partners: Male  Other Topics Concern   Not on file  Social History Narrative   Not on file   Social Determinants of Health   Financial Resource Strain: Not on file  Food Insecurity: No Food Insecurity (03/07/2020)   Hunger Vital Sign    Worried About Running Out  of Food in the Last Year: Never true    Ran Out of Food in the Last Year: Never true  Transportation Needs: No Transportation Needs (03/07/2020)   PRAPARE - Hydrologist (Medical): No    Lack of Transportation (Non-Medical): No  Physical Activity: Insufficiently Active (03/07/2020)   Exercise Vital Sign    Days of Exercise per Week: 1 day    Minutes of Exercise per Session: 30 min  Stress: Not on file  Social Connections: Not on file    Review of Systems  Constitutional:  Negative for appetite change, fatigue and fever.  HENT:  Negative for congestion, ear pain, sinus pressure and sore throat.   Respiratory:  Negative for cough, chest tightness, shortness of breath and wheezing.   Cardiovascular:  Negative for chest pain and palpitations.  Gastrointestinal:  Negative for abdominal pain, constipation, diarrhea, nausea and vomiting.  Genitourinary:  Negative for dysuria and hematuria.  Musculoskeletal:  Negative for arthralgias, back pain, joint swelling and myalgias.  Skin:  Negative for rash.  Neurological:  Negative for dizziness, weakness and headaches.  Psychiatric/Behavioral:  Negative for dysphoric mood. The patient is not nervous/anxious.      Objective:  BP 112/72 (BP Location: Left Arm, Patient Position: Sitting)   Pulse (!) 54   Temp (!) 97.5 F (36.4 C) (Temporal)   Ht 5' 3.5" (1.613 m)   Wt 141 lb (64 kg)   LMP  (LMP Unknown)   SpO2 97%   BMI 24.59 kg/m      09/10/2021    8:26 AM 06/25/2021   11:22 AM 06/04/2021    9:30 AM  BP/Weight  Systolic BP [phone]  Diastolic BP 72 80 72  Wt. (Lbs) 141  134.4  BMI 24.59 kg/m2  23.43 kg/m2    Physical Exam Vitals reviewed.  Constitutional:      Appearance: Normal appearance. She is normal weight.  Cardiovascular:     Rate and Rhythm: Normal rate and regular rhythm.     Heart sounds: Normal heart sounds.  Pulmonary:     Effort: Pulmonary effort is normal.     Breath sounds: Normal  breath sounds.  Abdominal:     General: Abdomen is flat. Bowel sounds are normal.     Palpations: Abdomen is soft.  Neurological:     Mental Status: She is alert and oriented to person, place, and time.  Psychiatric:        Mood and Affect: Mood normal.        Behavior: Behavior normal.     Diabetic Foot Exam - Simple   No data filed  Lab Results  Component Value Date   WBC 5.3 03/12/2021   HGB 13.0 03/12/2021   HCT 38.6 03/12/2021   PLT 350 03/12/2021   GLUCOSE 105 (H) 03/12/2021   CHOL 220 (H) 03/12/2021   TRIG 76 03/12/2021   HDL 83 03/12/2021   LDLCALC 124 (H) 03/12/2021   ALT 8 03/12/2021   AST 16 03/12/2021   NA 140 03/12/2021   K 4.7 03/12/2021   CL 102 03/12/2021   CREATININE 0.73 03/12/2021   BUN 15 03/12/2021   CO2 26 03/12/2021   TSH 2.550 11/10/2020   HGBA1C 5.8 (H) 03/12/2021      Assessment & Plan:   Problem List Items Addressed This Visit       Cardiovascular and Mediastinum   Essential hypertension    Well controlled.  No changes to medicines. Continue Losartan '25mg'$  take 1 tablet BID, HCTZ '25mg'$  take 1 tablet daily, Nebivolol HCL '20mg'$  take one tablet daily. Continue to work on eating a healthy diet and exercise.  Labs drawn today.        Relevant Orders   CBC With Diff/Platelet   Comprehensive metabolic panel     Endocrine   Hypothyroidism    Previously well controlled Continue Synthroid at current dose  Recheck TSH and adjust Synthroid as indicated       Relevant Orders   TSH     Other   Mixed hyperlipidemia - Primary    Well controlled.  No changes to medicines. Continue Zetia '10mg'$  taking one tablet daily, Atorvastatin 10 mg take 1 tablet daily.  Continue to work on eating a healthy diet and exercise.  Labs drawn today.        Relevant Orders   Lipid panel   Anxiety disorder    The current medical regimen is effective;  continue present plan and medications.       Elevated blood sugar   Relevant Orders    Hemoglobin A1c  .  No orders of the defined types were placed in this encounter.   Orders Placed This Encounter  Procedures   CBC With Diff/Platelet   Comprehensive metabolic panel   Lipid panel   TSH   Hemoglobin A1c     Follow-up: Return in about 6 months (around 03/13/2022) for chronic fasting.  An After Visit Summary was printed and given to the patient.    I,Lauren M Auman,acting as a scribe for Rochel Brome, MD.,have documented all relevant documentation on the behalf of Rochel Brome, MD,as directed by  Rochel Brome, MD while in the presence of Rochel Brome, MD.    Rochel Brome, MD [place] [phone]

## 2021-09-10 NOTE — Assessment & Plan Note (Addendum)
Well controlled.  No changes to medicines. Continue Zetia '10mg'$  taking one tablet daily, Atorvastatin 10 mg take 1 tablet daily.  Continue to work on eating a healthy diet and exercise.  Labs drawn today.

## 2021-09-11 LAB — LIPID PANEL
Chol/HDL Ratio: 2.6 ratio (ref 0.0–4.4)
Cholesterol, Total: 222 mg/dL — ABNORMAL HIGH (ref 100–199)
HDL: 86 mg/dL (ref >39)
LDL Chol Calc (NIH): 122 mg/dL — ABNORMAL HIGH (ref 0–99)
Triglycerides: 79 mg/dL (ref 0–149)
VLDL Cholesterol Cal: 14 mg/dL (ref 5–40)

## 2021-09-11 LAB — CBC WITH DIFF/PLATELET
Basophils Absolute: 0 10*3/uL (ref 0.0–0.2)
Basos: 1 %
EOS (ABSOLUTE): 0.1 10*3/uL (ref 0.0–0.4)
Eos: 2 %
Hematocrit: 38.8 % (ref 34.0–46.6)
Hemoglobin: 13.1 g/dL (ref 11.1–15.9)
Immature Grans (Abs): 0 10*3/uL (ref 0.0–0.1)
Immature Granulocytes: 0 %
Lymphocytes Absolute: 1.5 10*3/uL (ref 0.7–3.1)
Lymphs: 28 %
MCH: 32.1 pg (ref 26.6–33.0)
MCHC: 33.8 g/dL (ref 31.5–35.7)
MCV: 95 fL (ref 79–97)
Monocytes Absolute: 0.5 10*3/uL (ref 0.1–0.9)
Monocytes: 9 %
Neutrophils Absolute: 3.2 10*3/uL (ref 1.4–7.0)
Neutrophils: 60 %
Platelets: 304 10*3/uL (ref 150–450)
RBC: 4.08 x10E6/uL (ref 3.77–5.28)
RDW: 12.9 % (ref 11.7–15.4)
WBC: 5.3 10*3/uL (ref 3.4–10.8)

## 2021-09-11 LAB — COMPREHENSIVE METABOLIC PANEL
ALT: 13 IU/L (ref 0–32)
AST: 18 IU/L (ref 0–40)
Albumin/Globulin Ratio: 2.4 — ABNORMAL HIGH (ref 1.2–2.2)
Albumin: 4.1 g/dL (ref 3.8–4.8)
Alkaline Phosphatase: 50 IU/L (ref 44–121)
BUN/Creatinine Ratio: 19 (ref 12–28)
BUN: 14 mg/dL (ref 8–27)
Bilirubin Total: 0.4 mg/dL (ref 0.0–1.2)
CO2: 26 mmol/L (ref 20–29)
Calcium: 9.8 mg/dL (ref 8.7–10.3)
Chloride: 102 mmol/L (ref 96–106)
Creatinine, Ser: 0.72 mg/dL (ref 0.57–1.00)
Globulin, Total: 1.7 g/dL (ref 1.5–4.5)
Glucose: 107 mg/dL — ABNORMAL HIGH (ref 70–99)
Potassium: 4.2 mmol/L (ref 3.5–5.2)
Sodium: 139 mmol/L (ref 134–144)
Total Protein: 5.8 g/dL — ABNORMAL LOW (ref 6.0–8.5)
eGFR: 87 mL/min/{1.73_m2} (ref >59)

## 2021-09-11 LAB — HEMOGLOBIN A1C
Est. average glucose Bld gHb Est-mCnc: 120 mg/dL
Hgb A1c MFr Bld: 5.8 % — ABNORMAL HIGH (ref 4.8–5.6)

## 2021-09-11 LAB — TSH: TSH: 2.97 u[IU]/mL (ref 0.450–4.500)

## 2021-09-11 LAB — CARDIOVASCULAR RISK ASSESSMENT

## 2021-09-11 NOTE — Progress Notes (Signed)
Patient informed.  She will continue zetia as prescribed.

## 2021-09-11 NOTE — Progress Notes (Signed)
Blood count normal.  Liver function normal.  Kidney function normal.  Thyroid function normal.  Cholesterol: Total cholesterol 222.  LDL elevated 122.  HDL is excellent.  Continue Zetia 10 mg daily.  Unfortunately is intolerant to statin medicines. HBA1C: 5.8.  Consistent with prediabetes.

## 2021-09-13 ENCOUNTER — Telehealth: Payer: Self-pay | Admitting: Family Medicine

## 2021-09-13 NOTE — Telephone Encounter (Signed)
Left vm for pt to call the office to get her awv rs with kim. Per kim she can be rs to Tuesday 25th at 11am as a telephone call.

## 2021-09-17 ENCOUNTER — Ambulatory Visit (INDEPENDENT_AMBULATORY_CARE_PROVIDER_SITE_OTHER): Payer: Medicare Other

## 2021-09-17 VITALS — BP 110/70 | HR 58 | Resp 16 | Ht 63.5 in | Wt 141.0 lb

## 2021-09-17 DIAGNOSIS — Z1211 Encounter for screening for malignant neoplasm of colon: Secondary | ICD-10-CM | POA: Diagnosis not present

## 2021-09-17 DIAGNOSIS — Z0000 Encounter for general adult medical examination without abnormal findings: Secondary | ICD-10-CM

## 2021-09-19 NOTE — Progress Notes (Signed)
Subjective:   Dana [name] is a 76 y.o. female who presents for Medicare Annual (Subsequent) preventive examination.  This wellness visit is conducted by a nurse.  The patient's medications were reviewed and reconciled since the patient's last visit.  History details were provided by the patient.  The history appears to be reliable.    Medical History: Patient history and Family history was reviewed  Medications, Allergies, and preventative health maintenance was reviewed and updated.   Cardiac Risk Factors include: advanced age (>75mn, >>53women);hypertension;dyslipidemia     Objective:    Today's Vitals   09/17/21 1320  BP: 110/70  Pulse: (!) 58  Resp: 16  SpO2: 97%  Weight: 141 lb (64 kg)  Height: 5' 3.5" (1.613 m)  PainSc: 0-No pain   Body mass index is 24.59 kg/m.     09/05/2020    9:00 AM 07/29/2019   11:03 AM  Advanced Directives  Does Patient Have a Medical Advance Directive? Yes Yes  Type of [place] will  Does patient want to make changes to medical advance directive? No - Patient declined No - Patient declined  Copy of [place] Chart? No - copy requested No - copy requested    Current Medications (verified) Outpatient Encounter Medications as of 09/17/2021  Medication Sig   ALPRAZolam (XANAX) 0.25 MG tablet Take 1 tablet (0.25 mg total) by mouth at bedtime as needed for anxiety.   atorvastatin (LIPITOR) 10 MG tablet SMARTSIG:1 Tablet(s) By Mouth   cetirizine (ZYRTEC) 10 MG tablet Take 10 mg by mouth daily.   EFUDEX 5 % cream Apply topically.   ezetimibe (ZETIA) 10 MG tablet TAKE 1 TABLET BY MOUTH DAILY. (Patient taking differently: Take 10 mg by mouth daily.)   hydrochlorothiazide (HYDRODIURIL) 25 MG tablet TAKE 1 TABLET BY MOUTH DAILY (Patient taking differently: Take 25 mg by mouth daily.)   levothyroxine (SYNTHROID) 88 MCG tablet TAKE 1 TABLET BY  MOUTH ONCE DAILY   losartan (COZAAR) 25 MG tablet TAKE 1 TABLET BY MOUTH IN THE MORNING AND AT BEDTIME   meloxicam (MOBIC) 7.5 MG tablet Take 1 tablet (7.5 mg total) by mouth 2 (two) times daily as needed for pain.   Nebivolol HCl 20 MG TABS Take 1 tablet (20 mg total) by mouth daily.   PREMARIN 0.625 MG tablet Take 1 tablet (0.625 mg total) by mouth daily.   No facility-administered encounter medications on file as of 09/17/2021.    Allergies (verified) Lipitor [atorvastatin], Topamax [topiramate], Penicillins, and Pravastatin   History: Past Medical History:  Diagnosis Date   Anxiety disorder 07/19/2019   Chronic back pain 03/10/2018   Depression    Dyslipidemia 07/28/2019   Dyspnea on exertion 03/19/2019   Essential hypertension 03/19/2019   History of ovarian cancer    Hyperlipidemia 04/2015   Hypertension 02/2019   Hypothyroidism 03/10/2018   Impingement syndrome of right shoulder 05/12/2014   Insomnia due to other mental disorder 07/19/2019   Lumbar back pain 07/20/2019   Mixed hyperlipidemia 03/19/2019   Right shoulder pain 05/12/2014   Sciatic nerve pain, left 07/19/2019   Sciatic nerve pain, right 07/19/2019   Past Surgical History:  Procedure Laterality Date   ABDOMINAL HYSTERECTOMY  1986   [place], [place]  To rule out recurrent [place] cancer   TONSILLECTOMY     Family History  Problem Relation  Age of Onset   Congestive Heart Failure Mother    Osteoarthritis Mother    Social History   Socioeconomic History   Marital status: Married    Spouse name: Timmothy Sours  Tobacco Use   Smoking status: Never   Smokeless tobacco: Never  Vaping Use   Vaping Use: Never used  Substance and Sexual Activity   Alcohol use: Yes    Alcohol/week: 1.0 standard drink of alcohol    Types: 1 Glasses of wine per week    Comment: occasionally   Drug use: Not Currently    Types: Solvent inhalants   Sexual activity: Yes    Partners: Male   Social  Determinants of Health   Financial Resource Strain: Not on file  Food Insecurity: No Food Insecurity (03/07/2020)   Hunger Vital Sign    Worried About Running Out of Food in the Last Year: Never true    Ran Out of Food in the Last Year: Never true  Transportation Needs: No Transportation Needs (03/07/2020)   PRAPARE - Hydrologist (Medical): No    Lack of Transportation (Non-Medical): No  Physical Activity: Insufficiently Active (03/07/2020)   Exercise Vital Sign    Days of Exercise per Week: 1 day    Minutes of Exercise per Session: 30 min  Stress: Not on file  Social Connections: Not on file    Tobacco Counseling Counseling given: Never smoker   Clinical Intake:  Pre-visit preparation completed: Yes Pain : No/denies pain Pain Score: 0-No pain   BMI - recorded: 24.59 Nutritional Status: BMI of 19-24  Normal Nutritional Risks: None Diabetes: No How often do you need to have someone help you when you read instructions, pamphlets, or other written materials from your doctor or pharmacy?: 1 - Never Interpreter Needed?: No     Activities of Daily Living    09/19/2021    1:26 PM  In your present state of health, do you have any difficulty performing the following activities:  Hearing? 0  Vision? 0  Difficulty concentrating or making decisions? 0  Walking or climbing stairs? 0  Dressing or bathing? 0  Doing errands, shopping? 0  Preparing Food and eating ? N  Using the Toilet? N  In the past six months, have you accidently leaked urine? N  Do you have problems with loss of bowel control? N  Managing your Medications? N  Managing your Finances? N  Housekeeping or managing your Housekeeping? N    Patient Care Team: Rochel Brome, MD as PCP - General (Family Medicine) Park Liter, MD as Consulting Physician (Cardiology) Maple Hudson. (Inactive) as Referring Physician (Obstetrics and Gynecology) Monna Fam, MD as  Consulting Physician (Ophthalmology)     Assessment:   This is a routine wellness examination for Dana [name].  Hearing/Vision screen No results found.  Dietary issues and exercise activities discussed: Current Exercise Habits: The patient does not participate in regular exercise at present, Exercise limited by: None identified  Depression Screen    09/19/2021    1:26 PM 11/10/2020    8:41 AM 09/05/2020    9:11 AM 08/02/2020    8:42 AM 04/21/2020    7:34 AM 03/07/2020    2:54 PM 07/29/2019   11:04 AM  PHQ 2/9 Scores  PHQ - 2 Score 0 0 0 0 0 0 0    Fall Risk    09/17/2021    3:02 PM 11/10/2020    8:41 AM 09/05/2020  9:10 AM 08/02/2020    8:41 AM 04/21/2020    7:58 AM  Fall Risk   Falls in the past year? 0 0 0 0 1  Number falls in past yr: 0 0 0 0 0  Injury with Fall? 0 0 0 0 0  Risk for fall due to : No Fall Risks No Fall Risks No Fall Risks No Fall Risks History of fall(s);No Fall Risks  Follow up Falls evaluation completed;Education provided Falls evaluation completed Falls evaluation completed Falls evaluation completed Falls evaluation completed;Falls prevention discussed    FALL RISK PREVENTION PERTAINING TO THE HOME: Home free of loose throw rugs in walkways, pet beds, electrical cords, etc? Yes  Adequate lighting in your home to reduce risk of falls? Yes   ASSISTIVE DEVICES UTILIZED TO PREVENT FALLS:  Life alert? No  Use of a cane, walker or w/c? No  Gait steady and fast without use of assistive device  Cognitive Function:        09/19/2021    1:27 PM 09/05/2020    9:12 AM 07/29/2019   11:36 AM  6CIT Screen  What Year? 0 points 0 points 0 points  What month? 0 points 0 points 0 points  What time? 0 points 0 points 0 points  Count back from 20 0 points 0 points 0 points  Months in reverse 0 points 0 points 0 points  Repeat phrase 0 points 0 points 0 points  Total Score 0 points 0 points 0 points    Immunizations Immunization History  Administered Date(s)  Administered   Fluad Quad(high Dose 65+) 11/10/2020   Influenza Inj Mdck Quad Pf 11/05/2019   Influenza-Unspecified 11/25/2017, 11/30/2018   PFIZER Comirnaty(Gray Top)Covid-19 Tri-Sucrose Vaccine 03/13/2019, 04/03/2019, 11/26/2019, 06/05/2020   Pfizer Covid-19 Vaccine Bivalent Booster 29yr & up 12/18/2020   Pneumococcal Conjugate-13 05/13/2013, 05/30/2014   Pneumococcal Polysaccharide-23 12/18/2006, 08/03/2011   Tdap 03/11/2013   Zoster Recombinat (Shingrix) 11/06/2017, 02/03/2018   Zoster, Live 10/23/2007    TDAP status: Up to date  Flu Vaccine status: Up to date  Pneumococcal vaccine status: Up to date  Covid-19 vaccine status: Completed vaccines  Qualifies for Shingles Vaccine? Yes   Zostavax completed Yes   Shingrix Completed?: Yes  Screening Tests Health Maintenance  Topic Date Due   Hepatitis C Screening  Never done   Fecal DNA (Cologuard)  09/06/2021   INFLUENZA VACCINE  09/25/2021   MAMMOGRAM  09/06/2022   TETANUS/TDAP  03/12/2023   Pneumonia Vaccine 75 Years old  Completed   DEXA SCAN  Completed   COVID-19 Vaccine  Completed   Zoster Vaccines- Shingrix  Completed   HPV VACCINES  Aged Out   COLONOSCOPY (Pts 45-470yrInsurance coverage will need to be confirmed)  Discontinued    Health Maintenance  Health Maintenance Due  Topic Date Due   Hepatitis C Screening  Never done   Fecal DNA (Cologuard)  09/06/2021    Colorectal cancer screening: Type of screening: Cologuard. Completed 09/07/2018. Repeat every 3 years  Mammogram status: Completed 09/05/21. Repeat every year  Bone Density status: Completed 07/28/2017. Results reflect: Bone density results: NORMAL. Repeat every 5 years.  Lung Cancer Screening: (Low Dose CT Chest recommended if Age 75-80ears, 30 pack-year currently smoking OR have quit w/in 15years.) does not qualify.   Additional Screening:  Vision Screening: Recommended annual ophthalmology exams for early detection of glaucoma and other  disorders of the eye. Is the patient up to date with their annual eye exam?  Yes  Who is the provider or what is the name of the office in which the patient attends annual eye exams? [place] Screening: Recommended annual dental exams for proper oral hygiene    Plan:    1- Cologuard ordered 2- Immunizations updated - Flu Vaccine and COVID booster due in the fall 3- Exercise as tolerated  I have personally reviewed and noted the following in the patient's chart:   Medical and social history Use of alcohol, tobacco or illicit drugs  Current medications and supplements including opioid prescriptions.  Functional ability and status Nutritional status Physical activity Advanced directives List of other physicians Hospitalizations, surgeries, and ER visits in previous 12 months Vitals Screenings to include cognitive, depression, and falls Referrals and appointments  In addition, I have reviewed and discussed with patient certain preventive protocols, quality metrics, and best practice recommendations. A written personalized care plan for preventive services as well as general preventive health recommendations were provided to patient.     Erie Noe, LPN   [id number]

## 2021-09-19 NOTE — Patient Instructions (Signed)
Dana [name] , Thank you for taking time to come for your Medicare Wellness Visit. I appreciate your ongoing commitment to your health goals. Please review the following plan we discussed and let me know if I can assist you in the future.   Screening recommendations/referrals: Colonoscopy: Cologuard ordered Mammogram: Due 08/2022 Bone Density: Normal in 2019 Recommended yearly ophthalmology/optometry visit for glaucoma screening and checkup Recommended yearly dental visit for hygiene and checkup  Vaccinations: Influenza vaccine: Due Fall 2023 Pneumococcal vaccine: complete Tdap vaccine: due 2025 Shingles vaccine: complete     Preventive Care 75 Years and Older, Female Preventive care refers to lifestyle choices and visits with your health care provider that can promote health and wellness. What does preventive care include? A yearly physical exam. This is also called an annual well check. Dental exams once or twice a year. Routine eye exams. Ask your health care provider how often you should have your eyes checked. Personal lifestyle choices, including: Daily care of your teeth and gums. Regular physical activity. Eating a healthy diet. Avoiding tobacco and drug use. Limiting alcohol use. Practicing safe sex. Taking low-dose aspirin every day. Taking vitamin and mineral supplements as recommended by your health care provider. What happens during an annual well check? The services and screenings done by your health care provider during your annual well check will depend on your age, overall health, lifestyle risk factors, and family history of disease. Counseling  Your health care provider may ask you questions about your: Alcohol use. Tobacco use. Drug use. Emotional well-being. Home and relationship well-being. Sexual activity. Eating habits. History of falls. Memory and ability to understand (cognition). Work and work Statistician. Reproductive health. Screening  You may  have the following tests or measurements: Height, weight, and BMI. Blood pressure. Lipid and cholesterol levels. These may be checked every 5 years, or more frequently if you are over 89 years old. Skin check. Lung cancer screening. You may have this screening every year starting at age 38 if you have a 30-pack-year history of smoking and currently smoke or have quit within the past 15 years. Fecal occult blood test (FOBT) of the stool. You may have this test every year starting at age 43. Flexible sigmoidoscopy or colonoscopy. You may have a sigmoidoscopy every 5 years or a colonoscopy every 10 years starting at age 81. Hepatitis C blood test. Hepatitis B blood test. Sexually transmitted disease (STD) testing. Diabetes screening. This is done by checking your blood sugar (glucose) after you have not eaten for a while (fasting). You may have this done every 1-3 years. Bone density scan. This is done to screen for osteoporosis. You may have this done starting at age 65. Mammogram. This may be done every 1-2 years. Talk to your health care provider about how often you should have regular mammograms. Talk with your health care provider about your test results, treatment options, and if necessary, the need for more tests. Vaccines  Your health care provider may recommend certain vaccines, such as: Influenza vaccine. This is recommended every year. Tetanus, diphtheria, and acellular pertussis (Tdap, Td) vaccine. You may need a Td booster every 10 years. Zoster vaccine. You may need this after age 44. Pneumococcal 13-valent conjugate (PCV13) vaccine. One dose is recommended after age 65. Pneumococcal polysaccharide (PPSV23) vaccine. One dose is recommended after age 57. Talk to your health care provider about which screenings and vaccines you need and how often you need them. This information is not intended to replace advice given  to you by your health care provider. Make sure you discuss any  questions you have with your health care provider. Document Released: 03/10/2015 Document Revised: 11/01/2015 Document Reviewed: 12/13/2014 Elsevier Interactive Patient Education  2017 [place] Prevention in the Home Falls can cause injuries. They can happen to people of all ages. There are many things you can do to make your home safe and to help prevent falls. What can I do on the outside of my home? Regularly fix the edges of walkways and driveways and fix any cracks. Remove anything that might make you trip as you walk through a door, such as a raised step or threshold. Trim any bushes or trees on the path to your home. Use bright outdoor lighting. Clear any walking paths of anything that might make someone trip, such as rocks or tools. Regularly check to see if handrails are loose or broken. Make sure that both sides of any steps have handrails. Any raised decks and porches should have guardrails on the edges. Have any leaves, snow, or ice cleared regularly. Use sand or salt on walking paths during winter. Clean up any spills in your garage right away. This includes oil or grease spills. What can I do in the bathroom? Use night lights. Install grab bars by the toilet and in the tub and shower. Do not use towel bars as grab bars. Use non-skid mats or decals in the tub or shower. If you need to sit down in the shower, use a plastic, non-slip stool. Keep the floor dry. Clean up any water that spills on the floor as soon as it happens. Remove soap buildup in the tub or shower regularly. Attach bath mats securely with double-sided non-slip rug tape. Do not have throw rugs and other things on the floor that can make you trip. What can I do in the bedroom? Use night lights. Make sure that you have a light by your bed that is easy to reach. Do not use any sheets or blankets that are too big for your bed. They should not hang down onto the floor. Have a firm chair that has side  arms. You can use this for support while you get dressed. Do not have throw rugs and other things on the floor that can make you trip. What can I do in the kitchen? Clean up any spills right away. Avoid walking on wet floors. Keep items that you use a lot in easy-to-reach places. If you need to reach something above you, use a strong step stool that has a grab bar. Keep electrical cords out of the way. Do not use floor polish or wax that makes floors slippery. If you must use wax, use non-skid floor wax. Do not have throw rugs and other things on the floor that can make you trip. What can I do with my stairs? Do not leave any items on the stairs. Make sure that there are handrails on both sides of the stairs and use them. Fix handrails that are broken or loose. Make sure that handrails are as long as the stairways. Check any carpeting to make sure that it is firmly attached to the stairs. Fix any carpet that is loose or worn. Avoid having throw rugs at the top or bottom of the stairs. If you do have throw rugs, attach them to the floor with carpet tape. Make sure that you have a light switch at the top of the stairs and the bottom of the  stairs. If you do not have them, ask someone to add them for you. What else can I do to help prevent falls? Wear shoes that: Do not have high heels. Have rubber bottoms. Are comfortable and fit you well. Are closed at the toe. Do not wear sandals. If you use a stepladder: Make sure that it is fully opened. Do not climb a closed stepladder. Make sure that both sides of the stepladder are locked into place. Ask someone to hold it for you, if possible. Clearly mark and make sure that you can see: Any grab bars or handrails. First and last steps. Where the edge of each step is. Use tools that help you move around (mobility aids) if they are needed. These include: Canes. Walkers. Scooters. Crutches. Turn on the lights when you go into a dark area.  Replace any light bulbs as soon as they burn out. Set up your furniture so you have a clear path. Avoid moving your furniture around. If any of your floors are uneven, fix them. If there are any pets around you, be aware of where they are. Review your medicines with your doctor. Some medicines can make you feel dizzy. This can increase your chance of falling. Ask your doctor what other things that you can do to help prevent falls. This information is not intended to replace advice given to you by your health care provider. Make sure you discuss any questions you have with your health care provider. Document Released: 12/08/2008 Document Revised: 07/20/2015 Document Reviewed: 03/18/2014 Elsevier Interactive Patient Education  2017 [employer].

## 2021-09-27 ENCOUNTER — Other Ambulatory Visit: Payer: Self-pay | Admitting: Family Medicine

## 2021-10-04 ENCOUNTER — Telehealth: Payer: Self-pay

## 2021-10-04 NOTE — Progress Notes (Signed)
Care Gap(s) Not Met that Need to be Addressed:   Colorectal Cancer Screening   Action Taken: Messaged provider to inform pt is due for a colonoscopy   Follow Up: 03/18/22 with Dr. Tobie Poet

## 2021-10-08 LAB — COLOGUARD

## 2021-10-17 ENCOUNTER — Ambulatory Visit: Payer: Medicare Other | Admitting: Neurology

## 2021-10-17 DIAGNOSIS — Z1211 Encounter for screening for malignant neoplasm of colon: Secondary | ICD-10-CM | POA: Diagnosis not present

## 2021-10-20 ENCOUNTER — Other Ambulatory Visit: Payer: Self-pay | Admitting: Family Medicine

## 2021-10-20 DIAGNOSIS — I10 Essential (primary) hypertension: Secondary | ICD-10-CM

## 2021-10-25 LAB — COLOGUARD: COLOGUARD: NEGATIVE

## 2021-11-06 DIAGNOSIS — H524 Presbyopia: Secondary | ICD-10-CM | POA: Diagnosis not present

## 2021-12-03 ENCOUNTER — Ambulatory Visit (INDEPENDENT_AMBULATORY_CARE_PROVIDER_SITE_OTHER): Payer: Medicare Other | Admitting: Family Medicine

## 2021-12-03 ENCOUNTER — Encounter: Payer: Self-pay | Admitting: Family Medicine

## 2021-12-03 VITALS — BP 112/64 | HR 74 | Temp 98.7°F | Resp 15 | Ht 63.5 in | Wt 138.0 lb

## 2021-12-03 DIAGNOSIS — J208 Acute bronchitis due to other specified organisms: Secondary | ICD-10-CM | POA: Diagnosis not present

## 2021-12-03 LAB — POC COVID19 BINAXNOW: SARS Coronavirus 2 Ag: NEGATIVE

## 2021-12-03 MED ORDER — BENZONATATE 200 MG PO CAPS: 200.0000 mg | ORAL_CAPSULE | Freq: Three times a day (TID) | ORAL | 0 refills | Status: DC | PRN

## 2021-12-03 NOTE — Assessment & Plan Note (Signed)
COVID-19 negative. Patient has significantly improved. May continue Coricidin. Use Tessalon Perles.

## 2021-12-03 NOTE — Progress Notes (Signed)
Acute Office Visit  Subjective:    Patient ID: Dana [name], female    DOB: 06-16-1945, 76 y.o.   MRN: [id number]  Chief Complaint  Patient presents with   Cough   chest congestion    HPI: Patient is in today for acute cough, chest congestion, some SOB and rattles. She states that it started last week with fever, chills, nasal congestion and some headache when she was coughing. She took Coricidin and it helps a little, but she still not getting better. Rapid covid test was done and it was negative.  Past Medical History:  Diagnosis Date   Anxiety disorder 07/19/2019   Chronic back pain 03/10/2018   Depression    Dyslipidemia 07/28/2019   Dyspnea on exertion 03/19/2019   Essential hypertension 03/19/2019   History of ovarian cancer    Hyperlipidemia 04/2015   Hypertension 02/2019   Hypothyroidism 03/10/2018   Impingement syndrome of right shoulder 05/12/2014   Insomnia due to other mental disorder 07/19/2019   Lumbar back pain 07/20/2019   Mixed hyperlipidemia 03/19/2019   Right shoulder pain 05/12/2014   Sciatic nerve pain, left 07/19/2019   Sciatic nerve pain, right 07/19/2019    Past Surgical History:  Procedure Laterality Date   ABDOMINAL HYSTERECTOMY  1986   Total, also had chemo   APPENDECTOMY  1986   LAPAROTOMY  1987   To rule out recurrent ovarian cancer   TONSILLECTOMY      Family History  Problem Relation Age of Onset   Congestive Heart Failure Mother    Osteoarthritis Mother     Social History   Socioeconomic History   Marital status: Married    Spouse name: Roe Coombs   Number of children: Not on file   Years of education: Not on file   Highest education level: Not on file  Occupational History   Not on file  Tobacco Use   Smoking status: Never   Smokeless tobacco: Never  Vaping Use   Vaping Use: Never used  Substance and Sexual Activity   Alcohol use: Yes    Alcohol/week: 1.0 standard drink of alcohol    Types: 1 Glasses of wine per week    Comment:  occasionally   Drug use: Not Currently    Types: Solvent inhalants   Sexual activity: Yes    Partners: Male  Other Topics Concern   Not on file  Social History Narrative   Not on file   Social Determinants of Health   Financial Resource Strain: Not on file  Food Insecurity: No Food Insecurity (03/07/2020)   Hunger Vital Sign    Worried About Running Out of Food in the Last Year: Never true    Ran Out of Food in the Last Year: Never true  Transportation Needs: No Transportation Needs (03/07/2020)   PRAPARE - Administrator, Civil Service (Medical): No    Lack of Transportation (Non-Medical): No  Physical Activity: Insufficiently Active (03/07/2020)   Exercise Vital Sign    Days of Exercise per Week: 1 day    Minutes of Exercise per Session: 30 min  Stress: Not on file  Social Connections: Not on file  Intimate Partner Violence: Not on file    Outpatient Medications Prior to Visit  Medication Sig Dispense Refill   ALPRAZolam (XANAX) 0.25 MG tablet Take 1 tablet (0.25 mg total) by mouth at bedtime as needed for anxiety. 30 tablet 3   atorvastatin (LIPITOR) 10 MG tablet SMARTSIG:1 Tablet(s) By Mouth  cetirizine (ZYRTEC) 10 MG tablet Take 10 mg by mouth daily.     EFUDEX 5 % cream Apply topically.     ezetimibe (ZETIA) 10 MG tablet TAKE 1 TABLET BY MOUTH DAILY. (Patient taking differently: Take 10 mg by mouth daily.) 90 tablet 0   hydrochlorothiazide (HYDRODIURIL) 25 MG tablet TAKE 1 TABLET BY MOUTH DAILY (Patient taking differently: Take 25 mg by mouth daily.) 90 tablet 3   levothyroxine (SYNTHROID) 88 MCG tablet TAKE 1 TABLET BY MOUTH ONCE DAILY 90 tablet 3   losartan (COZAAR) 25 MG tablet TAKE 1 TABLET BY MOUTH IN THE MORNING AND AT BEDTIME 180 tablet 0   meloxicam (MOBIC) 7.5 MG tablet Take 1 tablet (7.5 mg total) by mouth 2 (two) times daily as needed for pain. 60 tablet 1   Nebivolol HCl 20 MG TABS Take 1 tablet (20 mg total) by mouth daily. 90 tablet 3    PREMARIN 0.625 MG tablet Take 1 tablet (0.625 mg total) by mouth daily. 90 tablet 3   No facility-administered medications prior to visit.    Allergies  Allergen Reactions   Lipitor [Atorvastatin]     myalgia   Penicillamine     Other reaction(s): Not available   Topamax [Topiramate] Other (See Comments)    BP decreased   Penicillins Rash    Other reaction(s): RASH    Pravastatin Other (See Comments)    Myalgia    Review of Systems  Constitutional:  Positive for fever. Negative for chills and fatigue.  HENT:  Negative for congestion, ear pain and sore throat.   Respiratory:  Positive for cough, chest tightness and shortness of breath.   Cardiovascular:  Negative for chest pain and palpitations.  Gastrointestinal:  Negative for abdominal pain, constipation, diarrhea, nausea and vomiting.  Endocrine: Negative for polydipsia, polyphagia and polyuria.  Genitourinary:  Negative for difficulty urinating and dysuria.  Musculoskeletal:  Negative for arthralgias, back pain and myalgias.  Skin:  Negative for rash.  Neurological:  Positive for headaches.  Psychiatric/Behavioral:  Negative for dysphoric mood. The patient is not nervous/anxious.        Objective:    Physical Exam Vitals reviewed.  Constitutional:      Appearance: Normal appearance.  HENT:     Right Ear: Tympanic membrane, ear canal and external ear normal.     Left Ear: Tympanic membrane, ear canal and external ear normal.     Nose: Congestion present.     Mouth/Throat:     Pharynx: Oropharynx is clear.  Cardiovascular:     Rate and Rhythm: Normal rate and regular rhythm.     Heart sounds: Normal heart sounds. No murmur heard. Pulmonary:     Effort: Pulmonary effort is normal. No respiratory distress.     Breath sounds: Normal breath sounds.  Lymphadenopathy:     Cervical: No cervical adenopathy.  Neurological:     Mental Status: She is alert and oriented to person, place, and time.  Psychiatric:         Mood and Affect: Mood normal.        Behavior: Behavior normal.     BP 112/64 (BP Location: Right Arm, Cuff Size: Normal)   Pulse 74   Temp 98.7 F (37.1 C)   Resp 15   Ht 5' 3.5" (1.613 m)   Wt 138 lb (62.6 kg)   LMP  (LMP Unknown)   SpO2 100%   BMI 24.06 kg/m  Wt Readings from Last 3 Encounters:  12/03/21 138  lb (62.6 kg)  09/17/21 141 lb (64 kg)  09/10/21 141 lb (64 kg)    Health Maintenance Due  Topic Date Due   Hepatitis C Screening  Never done   COVID-19 Vaccine (6 - Pfizer risk series) 02/12/2021   INFLUENZA VACCINE  09/25/2021    There are no preventive care reminders to display for this patient.   Lab Results  Component Value Date   TSH 2.970 09/10/2021   Lab Results  Component Value Date   WBC 5.3 09/10/2021   HGB 13.1 09/10/2021   HCT 38.8 09/10/2021   MCV 95 09/10/2021   PLT 304 09/10/2021   Lab Results  Component Value Date   NA 139 09/10/2021   K 4.2 09/10/2021   CO2 26 09/10/2021   GLUCOSE 107 (H) 09/10/2021   BUN 14 09/10/2021   CREATININE 0.72 09/10/2021   BILITOT 0.4 09/10/2021   ALKPHOS 50 09/10/2021   AST 18 09/10/2021   ALT 13 09/10/2021   PROT 5.8 (L) 09/10/2021   ALBUMIN 4.1 09/10/2021   CALCIUM 9.8 09/10/2021   EGFR 87 09/10/2021   Lab Results  Component Value Date   CHOL 222 (H) 09/10/2021   Lab Results  Component Value Date   HDL 86 09/10/2021   Lab Results  Component Value Date   LDLCALC 122 (H) 09/10/2021   Lab Results  Component Value Date   TRIG 79 09/10/2021   Lab Results  Component Value Date   CHOLHDL 2.6 09/10/2021   Lab Results  Component Value Date   HGBA1C 5.8 (H) 09/10/2021       Assessment & Plan:   Problem List Items Addressed This Visit       Respiratory   Acute bronchitis due to other specified organisms - Primary    COVID-19 negative. Patient has significantly improved. May continue Coricidin. Use Tessalon Perles.      Relevant Orders   POC COVID-19 (Completed)   Meds  ordered this encounter  Medications   benzonatate (TESSALON) 200 MG capsule    Sig: Take 1 capsule (200 mg total) by mouth 3 (three) times daily as needed for cough.    Dispense:  30 capsule    Refill:  0    Orders Placed This Encounter  Procedures   POC COVID-19     Follow-up: Return if symptoms worsen or fail to improve.  An After Visit Summary was printed and given to the patient.  Rochel Brome, MD [facility] [phone]

## 2021-12-03 NOTE — Patient Instructions (Signed)
IF bp low

## 2022-01-02 ENCOUNTER — Other Ambulatory Visit: Payer: Self-pay | Admitting: Family Medicine

## 2022-01-07 ENCOUNTER — Other Ambulatory Visit: Payer: Self-pay | Admitting: Family Medicine

## 2022-01-30 NOTE — Progress Notes (Signed)
This encounter was created in error - please disregard.

## 2022-03-09 ENCOUNTER — Other Ambulatory Visit: Payer: Self-pay | Admitting: Family Medicine

## 2022-03-13 DIAGNOSIS — H40013 Open angle with borderline findings, low risk, bilateral: Secondary | ICD-10-CM | POA: Diagnosis not present

## 2022-03-15 NOTE — Progress Notes (Signed)
Subjective:  Patient ID: Dana [name], female    DOB: 1945-04-21  Age: 77 y.o. MRN: [id number]  Chief Complaint  Patient presents with   Hyperlipidemia   Hypertension    Hyperlipidemia: Current medications: Zetia '10mg'$  taking one tablet daily, Atorvastatin 10 mg take 1 tablet daily. Tolerating well.   Hypertension:before bed BID, HCTZ '25mg'$  take 1 tablet daily, Nebivolol HCL '20mg'$  takes 1/2 tablet daily. Bp is great today. Checks a couple times a week and is good. Bps 100-12-/80. Patient cut down her medicines to doses above and is doing well.  Eat healthy.  Exercise: walking at least 2-3 times per week. .   Hypothyroidism: Current medications: Levothyroxine 88 mcg take 1 tablet daily.  Anxiety: Current medications: Xanax 0.'25mg'$  take 1/2 tablet daily at night  Patient was having severe cluster headaches in December 2022 and made an appointment in August 2023 with Neurology, but cancelled due ot resolution of headaches.      Current Outpatient Medications on File Prior to Visit  Medication Sig Dispense Refill   atorvastatin (LIPITOR) 10 MG tablet SMARTSIG:1 Tablet(s) By Mouth     cetirizine (ZYRTEC) 10 MG tablet Take 10 mg by mouth daily.     EFUDEX 5 % cream Apply topically.     levothyroxine (SYNTHROID) 88 MCG tablet TAKE 1 TABLET BY MOUTH ONCE DAILY 90 tablet 3   meloxicam (MOBIC) 7.5 MG tablet Take 1 tablet (7.5 mg total) by mouth 2 (two) times daily as needed for pain. 60 tablet 1   PREMARIN 0.625 MG tablet Take 1 tablet (0.625 mg total) by mouth daily. 90 tablet 3   No current facility-administered medications on file prior to visit.   Past Medical History:  Diagnosis Date   Anxiety disorder 07/19/2019   Chronic back pain 03/10/2018   Depression    Dyslipidemia 07/28/2019   Dyspnea on exertion 03/19/2019   Essential hypertension 03/19/2019   History of ovarian cancer    Hyperlipidemia 04/2015   Hypertension 02/2019   Hypothyroidism 03/10/2018   Impingement syndrome of  right shoulder 05/12/2014   Insomnia due to other mental disorder 07/19/2019   Lumbar back pain 07/20/2019   Mixed hyperlipidemia 03/19/2019   Right shoulder pain 05/12/2014   Sciatic nerve pain, left 07/19/2019   Sciatic nerve pain, right 07/19/2019   Past Surgical History:  Procedure Laterality Date   ABDOMINAL HYSTERECTOMY  1986   Total, also had chemo   [place]   To rule out recurrent ovarian cancer   TONSILLECTOMY      Family History  Problem Relation Age of Onset   Congestive Heart Failure Mother    Osteoarthritis Mother    Social History   Socioeconomic History   Marital status: Married    Spouse name: Timmothy Sours   Number of children: Not on file   Years of education: Not on file   Highest education level: Not on file  Occupational History   Not on file  Tobacco Use   Smoking status: Never   Smokeless tobacco: Never  Vaping Use   Vaping Use: Never used  Substance and Sexual Activity   Alcohol use: Yes    Alcohol/week: 1.0 standard drink of alcohol    Types: 1 Glasses of wine per week    Comment: occasionally   Drug use: Not Currently    Types: Solvent inhalants   Sexual activity: Yes    Partners: Male  Other Topics Concern   Not on  file  Social History Narrative   Not on file   Social Determinants of Health   Financial Resource Strain: Not on file  Food Insecurity: No Food Insecurity (03/07/2020)   Hunger Vital Sign    Worried About Running Out of Food in the Last Year: Never true    Ran Out of Food in the Last Year: Never true  Transportation Needs: No Transportation Needs (03/07/2020)   PRAPARE - Hydrologist (Medical): No    Lack of Transportation (Non-Medical): No  Physical Activity: Insufficiently Active (03/07/2020)   Exercise Vital Sign    Days of Exercise per Week: 1 day    Minutes of Exercise per Session: 30 min  Stress: Not on file  Social Connections: Not on file    Review of Systems   Constitutional:  Negative for appetite change, fatigue and fever.  HENT:  Negative for congestion, ear pain, sinus pressure and sore throat.   Respiratory:  Negative for cough, chest tightness, shortness of breath and wheezing.   Cardiovascular:  Negative for chest pain and palpitations.  Gastrointestinal:  Negative for abdominal pain, constipation, diarrhea, nausea and vomiting.  Genitourinary:  Negative for dysuria and hematuria.  Musculoskeletal:  Positive for arthralgias. Negative for back pain, joint swelling and myalgias.  Skin:  Negative for rash.  Neurological:  Negative for dizziness, weakness and headaches.  Psychiatric/Behavioral:  Negative for dysphoric mood. The patient is not nervous/anxious.      Objective:  BP 110/80   Pulse 60   Temp (!) 96.9 F (36.1 C)   Resp 14   Ht 5' 3.5" (1.613 m)   Wt 140 lb (63.5 kg)   LMP  (LMP Unknown)   BMI 24.41 kg/m      03/18/2022    8:09 AM 12/03/2021   11:25 AM 12/03/2021   10:47 AM  BP/Weight  Systolic BP 409 811 914  Diastolic BP 80 64 74  Wt. (Lbs) 140  138  BMI 24.41 kg/m2  24.06 kg/m2    Physical Exam Vitals reviewed.  Constitutional:      Appearance: Normal appearance. She is normal weight.  Neck:     Vascular: No carotid bruit.  Cardiovascular:     Rate and Rhythm: Normal rate and regular rhythm.     Heart sounds: Normal heart sounds.  Pulmonary:     Effort: Pulmonary effort is normal.     Breath sounds: Normal breath sounds.  Abdominal:     General: Abdomen is flat. Bowel sounds are normal.     Palpations: Abdomen is soft.     Tenderness: There is no abdominal tenderness.  Neurological:     Mental Status: She is alert and oriented to person, place, and time.  Psychiatric:        Mood and Affect: Mood normal.        Behavior: Behavior normal.     Diabetic Foot Exam - Simple   No data filed      Lab Results  Component Value Date   WBC 6.0 03/18/2022   HGB 13.9 03/18/2022   HCT 41.5 03/18/2022    PLT 337 03/18/2022   GLUCOSE 100 (H) 03/18/2022   CHOL 229 (H) 03/18/2022   TRIG 77 03/18/2022   HDL 75 03/18/2022   LDLCALC 141 (H) 03/18/2022   ALT 9 03/18/2022   AST 16 03/18/2022   NA 140 03/18/2022   K 4.4 03/18/2022   CL 101 03/18/2022   CREATININE 0.79 03/18/2022  BUN 15 03/18/2022   CO2 26 03/18/2022   TSH 1.900 03/18/2022   HGBA1C 5.9 (H) 03/18/2022      Assessment & Plan:   Tinzlee was seen today for hyperlipidemia and hypertension.  Mixed hyperlipidemia Assessment & Plan: Well controlled.  No changes to medicines.  Continue to work on eating a healthy diet and exercise.  Labs drawn today.    Orders: -     CBC with Differential/Platelet -     Comprehensive metabolic panel -     Lipid panel -     Ezetimibe; Take 1 tablet (10 mg total) by mouth daily.  Dispense: 90 tablet; Refill: 3  Essential hypertension Assessment & Plan: Well controlled.  No changes to medicines.  Continue to work on eating a healthy diet and exercise.  Labs drawn today.    Orders: -     Losartan Potassium; Take 1 tablet (25 mg total) by mouth daily.  Dispense: 90 tablet; Refill: 1 -     hydroCHLOROthiazide; Take 1 tablet (25 mg total) by mouth daily.  Dispense: 90 tablet; Refill: 0 -     Nebivolol HCl; Take 0.5 tablets (10 mg total) by mouth daily.  Dispense: 45 tablet; Refill: 3 -     Cardiovascular Risk Assessment  Hypothyroidism due to acquired atrophy of thyroid Assessment & Plan: Previously well controlled Continue Synthroid at current dose  Recheck TSH and adjust Synthroid as indicated    Orders: -     TSH  Anxiety disorder, unspecified type Assessment & Plan: Continue xanax.  Orders: -     ALPRAZolam; TAKE 1 TABLET BY MOUTH AT BEDTIME AS NEEDED FOR ANXIETY.  Dispense: 30 tablet; Refill: 5  Elevated blood sugar Assessment & Plan: Recommend continue to work on eating healthy diet and exercise.   Orders: -     Hemoglobin A1c     Meds ordered this  encounter  Medications   losartan (COZAAR) 25 MG tablet    Sig: Take 1 tablet (25 mg total) by mouth daily.    Dispense:  90 tablet    Refill:  1   hydrochlorothiazide (HYDRODIURIL) 25 MG tablet    Sig: Take 1 tablet (25 mg total) by mouth daily.    Dispense:  90 tablet    Refill:  0   ezetimibe (ZETIA) 10 MG tablet    Sig: Take 1 tablet (10 mg total) by mouth daily.    Dispense:  90 tablet    Refill:  3   ALPRAZolam (XANAX) 0.25 MG tablet    Sig: TAKE 1 TABLET BY MOUTH AT BEDTIME AS NEEDED FOR ANXIETY.    Dispense:  30 tablet    Refill:  5   Nebivolol HCl 20 MG TABS    Sig: Take 0.5 tablets (10 mg total) by mouth daily.    Dispense:  45 tablet    Refill:  3    Orders Placed This Encounter  Procedures   CBC with Differential/Platelet   Comprehensive metabolic panel   Lipid panel   TSH   Hemoglobin A1c   Cardiovascular Risk Assessment     Follow-up: Return in about 6 months (around 09/16/2022) for awv, chronic fasting.  An After Visit Summary was printed and given to the patient.  Rochel Brome, MD [facility] [phone]

## 2022-03-18 ENCOUNTER — Encounter: Payer: Self-pay | Admitting: Family Medicine

## 2022-03-18 ENCOUNTER — Ambulatory Visit (INDEPENDENT_AMBULATORY_CARE_PROVIDER_SITE_OTHER): Payer: Medicare Other | Admitting: Family Medicine

## 2022-03-18 VITALS — BP 110/80 | HR 60 | Temp 96.9°F | Resp 14 | Ht 63.5 in | Wt 140.0 lb

## 2022-03-18 DIAGNOSIS — I10 Essential (primary) hypertension: Secondary | ICD-10-CM | POA: Diagnosis not present

## 2022-03-18 DIAGNOSIS — E034 Atrophy of thyroid (acquired): Secondary | ICD-10-CM

## 2022-03-18 DIAGNOSIS — E782 Mixed hyperlipidemia: Secondary | ICD-10-CM | POA: Diagnosis not present

## 2022-03-18 DIAGNOSIS — R739 Hyperglycemia, unspecified: Secondary | ICD-10-CM | POA: Diagnosis not present

## 2022-03-18 DIAGNOSIS — F419 Anxiety disorder, unspecified: Secondary | ICD-10-CM

## 2022-03-18 LAB — HEMOGLOBIN A1C
Est. average glucose Bld gHb Est-mCnc: 123 mg/dL
Hgb A1c MFr Bld: 5.9 % — ABNORMAL HIGH (ref 4.8–5.6)

## 2022-03-18 MED ORDER — LOSARTAN POTASSIUM 25 MG PO TABS: 25.0000 mg | ORAL_TABLET | Freq: Every day | ORAL | 1 refills | Status: DC

## 2022-03-18 MED ORDER — HYDROCHLOROTHIAZIDE 25 MG PO TABS: 25.0000 mg | ORAL_TABLET | Freq: Every day | ORAL | 0 refills | Status: DC

## 2022-03-18 MED ORDER — EZETIMIBE 10 MG PO TABS: 10.0000 mg | ORAL_TABLET | Freq: Every day | ORAL | 3 refills | Status: DC

## 2022-03-18 MED ORDER — ALPRAZOLAM 0.25 MG PO TABS: ORAL_TABLET | ORAL | 5 refills | Status: DC

## 2022-03-18 MED ORDER — NEBIVOLOL HCL 20 MG PO TABS: 10.0000 mg | ORAL_TABLET | Freq: Every day | ORAL | 3 refills | Status: DC

## 2022-03-18 NOTE — Patient Instructions (Signed)
Recommend balance exercises.

## 2022-03-19 LAB — CBC WITH DIFFERENTIAL/PLATELET
Basophils Absolute: 0 10*3/uL (ref 0.0–0.2)
Basos: 1 %
EOS (ABSOLUTE): 0.1 10*3/uL (ref 0.0–0.4)
Eos: 2 %
Hematocrit: 41.5 % (ref 34.0–46.6)
Hemoglobin: 13.9 g/dL (ref 11.1–15.9)
Immature Grans (Abs): 0 10*3/uL (ref 0.0–0.1)
Immature Granulocytes: 0 %
Lymphocytes Absolute: 1.4 10*3/uL (ref 0.7–3.1)
Lymphs: 24 %
MCH: 31.4 pg (ref 26.6–33.0)
MCHC: 33.5 g/dL (ref 31.5–35.7)
MCV: 94 fL (ref 79–97)
Monocytes Absolute: 0.4 10*3/uL (ref 0.1–0.9)
Monocytes: 6 %
Neutrophils Absolute: 4 10*3/uL (ref 1.4–7.0)
Neutrophils: 67 %
Platelets: 337 10*3/uL (ref 150–450)
RBC: 4.42 x10E6/uL (ref 3.77–5.28)
RDW: 12.3 % (ref 11.7–15.4)
WBC: 6 10*3/uL (ref 3.4–10.8)

## 2022-03-19 LAB — COMPREHENSIVE METABOLIC PANEL
ALT: 9 IU/L (ref 0–32)
AST: 16 IU/L (ref 0–40)
Albumin/Globulin Ratio: 1.7 (ref 1.2–2.2)
Albumin: 3.8 g/dL (ref 3.8–4.8)
Alkaline Phosphatase: 55 IU/L (ref 44–121)
BUN/Creatinine Ratio: 19 (ref 12–28)
BUN: 15 mg/dL (ref 8–27)
Bilirubin Total: 0.3 mg/dL (ref 0.0–1.2)
CO2: 26 mmol/L (ref 20–29)
Calcium: 9.1 mg/dL (ref 8.7–10.3)
Chloride: 101 mmol/L (ref 96–106)
Creatinine, Ser: 0.79 mg/dL (ref 0.57–1.00)
Globulin, Total: 2.2 g/dL (ref 1.5–4.5)
Glucose: 100 mg/dL — ABNORMAL HIGH (ref 70–99)
Potassium: 4.4 mmol/L (ref 3.5–5.2)
Sodium: 140 mmol/L (ref 134–144)
Total Protein: 6 g/dL (ref 6.0–8.5)
eGFR: 78 mL/min/{1.73_m2} (ref >59)

## 2022-03-19 LAB — LIPID PANEL
Chol/HDL Ratio: 3.1 ratio (ref 0.0–4.4)
Cholesterol, Total: 229 mg/dL — ABNORMAL HIGH (ref 100–199)
HDL: 75 mg/dL (ref >39)
LDL Chol Calc (NIH): 141 mg/dL — ABNORMAL HIGH (ref 0–99)
Triglycerides: 77 mg/dL (ref 0–149)
VLDL Cholesterol Cal: 13 mg/dL (ref 5–40)

## 2022-03-19 LAB — CARDIOVASCULAR RISK ASSESSMENT

## 2022-03-19 LAB — TSH: TSH: 1.9 u[IU]/mL (ref 0.450–4.500)

## 2022-03-19 NOTE — Progress Notes (Signed)
Blood count normal.  Liver function normal.  Kidney function normal.  Thyroid function normal.  Cholesterol: LDL 14, worsened.. Continue zetia. Not at goal. Recommend low fat diet and exercise.  HBA1C: 5.9

## 2022-03-24 NOTE — Assessment & Plan Note (Signed)
Continue xanax 

## 2022-03-24 NOTE — Assessment & Plan Note (Signed)
Previously well controlled Continue Synthroid at current dose  Recheck TSH and adjust Synthroid as indicated   

## 2022-03-24 NOTE — Assessment & Plan Note (Signed)
Well controlled.  ?No changes to medicines.  ?Continue to work on eating a healthy diet and exercise.  ?Labs drawn today.  ?

## 2022-03-24 NOTE — Assessment & Plan Note (Signed)
Recommend continue to work on eating healthy diet and exercise.  

## 2022-04-11 ENCOUNTER — Telehealth: Payer: Self-pay

## 2022-04-11 DIAGNOSIS — I10 Essential (primary) hypertension: Secondary | ICD-10-CM

## 2022-04-11 MED ORDER — NEBIVOLOL HCL 20 MG PO TABS: 10.0000 mg | ORAL_TABLET | Freq: Every day | ORAL | 3 refills | Status: DC

## 2022-04-11 NOTE — Telephone Encounter (Signed)
Dana [name] called to report that her bp is 73/62, pulse 75.  She is complaining of feeling dizzy.  Dr. Tobie Poet advised that she stop the bystolic, losartan and hydrochlorothiazide.  She was instructed to check her bp twice daily and record.  If her systolic bp is > 0000000 take Bystolic 10 mg.  She was advised to rest and drink plenty of liquids.

## 2022-04-12 ENCOUNTER — Other Ambulatory Visit: Payer: Self-pay | Admitting: Family Medicine

## 2022-04-12 DIAGNOSIS — F419 Anxiety disorder, unspecified: Secondary | ICD-10-CM

## 2022-05-06 DIAGNOSIS — K080 Exfoliation of teeth due to systemic causes: Secondary | ICD-10-CM | POA: Diagnosis not present

## 2022-05-28 ENCOUNTER — Telehealth: Payer: Self-pay

## 2022-05-28 NOTE — Telephone Encounter (Signed)
Patient informed, patient will go to ER if it happens again and patient states that she will call her Cardiologist to inform them of the Episode to get advice from them.

## 2022-05-28 NOTE — Telephone Encounter (Signed)
Patient called stating that Yesterday evening she was washing dishes and all of sudden felt a little weak and felt like her heart was doing something funny. She then took her BP and Pulse which her BP was 85/57 and then her pulse was 140. She states that it took about 10-15 minutes before things went back to normal which was 118/66 and her pulse was 95 afterwards. She states she has not felt like that since then but wanted to let you know to see if there is anything she needs to do or be concerned about. I told patient to make sure and check her BP and Pulse x3 daily and to keep a log for sure and definitely any time she starts to feel like this again to check it then too as well. I told patient I would send message to provider to see if there are any recommendations.

## 2022-05-30 NOTE — Progress Notes (Signed)
Cardiology Office Note:    Date:  05/31/2022   ID:  Dana PihBelinda S Selph, DOB 02-May-1945, MRN [id number]  PCP:  Blane Oharaox, Kirsten, MD   [facility] Providers Cardiologist:  None     Referring MD: Blane Oharaox, Kirsten, MD   Chief Complaint: palpitations  History of Present Illness:    Dana [name] is a very pleasant 77 y.o. female with a hx of HTN, dyslipidemia, and hypothyroidism.    She was referred to cardiology for evaluation of hypertension and seen by Dr. Bing MatterKrasowski. Renal ultrasound 02/2019 revealed no evidence of significant renal artery stenosis.  2D echo complted 03/23/2019 revealed LVEF 60 to 65%, mild LVH, no regional wall motion abnormalities, mild MR, mild TR, mild dilatation of the ascending aorta at 37 mm.  Last cardiology clinic visit was 06/04/2021 with Dr. Bing MatterKrasowski at which time she was being evaluated by internal medicine for ocular migraine. She continued to exercise on a regular basis between 3 and 5 times a week for about 45 minutes. No specific cardiac symptoms. Her recent lipid panel showed LDL of 124, HDL 83.  She was continued on Zetia and 1 year follow-up was recommended.   Today, she is here and is accompanied by her daughter for follow-up. Previously on 3 anti-hypertensives but had episodes of hypotension. Describes a recent episode of HR 155 bpm, felt like butterflies in chest,and SBP was 80 mmHg. She contacted her PCP who advised her to stop nebivolol, however HR was high at the time. She has continued to note some irregular HR. Home BP this week has been systolic 118-140s, diastolic 60s-70s. Pulse from 50s to 90s bpm. No additional episodes of HR > 100 bpm to her awareness.  Reports she has been more fatigued recently.  No chest pain, shortness of breath, edema, orthopnea, PND, presyncope, or syncope.  Past Medical History:  Diagnosis Date   Anxiety disorder 07/19/2019   Chronic back pain 03/10/2018   Depression    Dyslipidemia 07/28/2019   Dyspnea on exertion 03/19/2019    Essential hypertension 03/19/2019   History of ovarian cancer    Hyperlipidemia 04/2015   Hypertension 02/2019   Hypothyroidism 03/10/2018   Impingement syndrome of right shoulder 05/12/2014   Insomnia due to other mental disorder 07/19/2019   Lumbar back pain 07/20/2019   Mixed hyperlipidemia 03/19/2019   Right shoulder pain 05/12/2014   Sciatic nerve pain, left 07/19/2019   Sciatic nerve pain, right 07/19/2019    Past Surgical History:  Procedure Laterality Date   ABDOMINAL HYSTERECTOMY  1986   Total, also had chemo   APPENDECTOMY  1986   LAPAROTOMY  1987   To rule out recurrent ovarian cancer   TONSILLECTOMY      Current Medications: Current Meds  Medication Sig   ALPRAZolam (XANAX) 0.25 MG tablet TAKE 1 TABLET BY MOUTH AT BEDTIME AS NEEDED FOR ANXIETY.   cetirizine (ZYRTEC) 10 MG tablet Take 10 mg by mouth daily.   levothyroxine (SYNTHROID) 88 MCG tablet TAKE 1 TABLET BY MOUTH ONCE DAILY   meloxicam (MOBIC) 7.5 MG tablet Take 1 tablet (7.5 mg total) by mouth 2 (two) times daily as needed for pain.   PREMARIN 0.625 MG tablet Take 1 tablet (0.625 mg total) by mouth daily.   rosuvastatin (CRESTOR) 5 MG tablet Take 1 tablet (5 mg total) by mouth 3 (three) times a week.     Allergies:   Lipitor [atorvastatin], Penicillamine, Topamax [topiramate], Penicillins, and Pravastatin   Social History   Socioeconomic History  Marital status: Married    Spouse name: Roe Coombs   Number of children: Not on file   Years of education: Not on file   Highest education level: Not on file  Occupational History   Not on file  Tobacco Use   Smoking status: Never   Smokeless tobacco: Never  Vaping Use   Vaping Use: Never used  Substance and Sexual Activity   Alcohol use: Yes    Alcohol/week: 1.0 standard drink of alcohol    Types: 1 Glasses of wine per week    Comment: occasionally   Drug use: Not Currently    Types: Solvent inhalants   Sexual activity: Yes    Partners: Male  Other Topics  Concern   Not on file  Social History Narrative   Not on file   Social Determinants of Health   Financial Resource Strain: Not on file  Food Insecurity: No Food Insecurity (03/07/2020)   Hunger Vital Sign    Worried About Running Out of Food in the Last Year: Never true    Ran Out of Food in the Last Year: Never true  Transportation Needs: No Transportation Needs (03/07/2020)   PRAPARE - Administrator, Civil Service (Medical): No    Lack of Transportation (Non-Medical): No  Physical Activity: Insufficiently Active (03/07/2020)   Exercise Vital Sign    Days of Exercise per Week: 1 day    Minutes of Exercise per Session: 30 min  Stress: Not on file  Social Connections: Not on file     Family History: The patient's family history includes Congestive Heart Failure in her mother; Osteoarthritis in her mother.  ROS:   Please see the history of present illness.    + fatigue + palpitations All other systems reviewed and are negative.  Labs/Other Studies Reviewed:    The following studies were reviewed today:  Echo 03/23/2019 1. Left ventricular ejection fraction, by visual estimation, is 60 to  65%. There is mildly increased left ventricular hypertrophy.   2. The left ventricle has no regional wall motion abnormalities.   3. Left atrial size was normal.   4. Right atrial size was normal.   5. The mitral valve is normal in structure. Mild mitral valve  regurgitation. No evidence of mitral stenosis.   6. The tricuspid valve is normal in structure. Tricuspid valve  regurgitation is mild.   7. The aortic valve is normal in structure. Aortic valve regurgitation is  trivial. No evidence of aortic valve sclerosis or stenosis.   8. The pulmonic valve was normal in structure. Pulmonic valve  regurgitation is trivial.   9. There is mild dilatation of the ascending aorta measuring 37 mm.   Renal Artery Ultrasound 03/24/2019 Right: Normal size right kidney. No evidence of  right renal artery         stenosis.  Left:  Normal size of left kidney. No evidence of left renal artery         stenosis.    *See table(s) above for measurements and observations. Recent Labs: 03/18/2022: ALT 9; BUN 15; Creatinine, Ser 0.79; Hemoglobin 13.9; Platelets 337; Potassium 4.4; Sodium 140; TSH 1.900  Recent Lipid Panel    Component Value Date/Time   CHOL 229 (H) 03/18/2022 0904   TRIG 77 03/18/2022 0904   HDL 75 03/18/2022 0904   CHOLHDL 3.1 03/18/2022 0904   LDLCALC 141 (H) 03/18/2022 0904     Risk Assessment/Calculations:  Physical Exam:    VS:  BP 132/70   Pulse 69   Ht 5' 3.5" (1.613 m)   Wt 142 lb (64.4 kg)   LMP  (LMP Unknown)   SpO2 96%   BMI 24.76 kg/m     Wt Readings from Last 3 Encounters:  05/31/22 142 lb (64.4 kg)  03/18/22 140 lb (63.5 kg)  12/03/21 138 lb (62.6 kg)     GEN:  Well nourished, well developed in no acute distress HEENT: Normal NECK: No JVD; No carotid bruits CARDIAC: RRR, soft systolic murmur. No  rubs, gallops RESPIRATORY:  Clear to auscultation without rales, wheezing or rhonchi  ABDOMEN: Soft, non-tender, non-distended MUSCULOSKELETAL:  No edema; No deformity. 2+ pedal pulses, equal bilaterally SKIN: Warm and dry NEUROLOGIC:  Alert and oriented x 3 PSYCHIATRIC:  Normal affect   EKG:  EKG is ordered today.  The ekg ordered today demonstrates normal sinus rhythm at 69 bpm, rightward axis, no ST abnormality       Diagnoses:    1. Nonrheumatic mitral valve regurgitation   2. Palpitations   3. Essential hypertension   4. Dyslipidemia    Assessment and Plan:     Palpitations: Increased episodes of palpitations and fatigue over the past few weeks. Noted HR as high as 155 bpm on one occasion. Nebivolol recently stopped secondary to hypotension. Normal TSH, no electrolyte abnormalities on labs completed 03/18/2022. We will place a 14-day ZIO monitor for evaluation of palpitations.  Will update 2D echo to  evaluate heart and valve function. Lengthy discussion about potential arrhythmias - she has a number of friends with a fib so she is very familiar. Consider resuming beta-blocker or starting calcium channel blocker if tachy arrhythmia identified on monitor.   Hypertension: BP is stable today. Has been labile over the past few weeks. Will have her remain off anti-hypertensives as we await results of cardiac monitor and echocardiogram.   Mitral regurgitation: Soft murmur on exam. History of mild MR on echo 2021. We will update echo for evaluation of heart and valve function.  Hyperlipidemia: LDL 141 on 03/18/2022.  We discussed goal LDL < 100, ideally lower to decrease CV risk.  Previous intolerance to atorvastatin and pravastatin. Willing to try rosuvastatin 5 mg 3 days weekly. Asked her to notify me if she does not tolerate low dose statin for further recommendations with non-statin cholesterol medications. Recheck lipid/ALT after 2-3 months of therapy.      Disposition: 2-3 months with Dr. Bing MatterKrasowski  Medication Adjustments/Labs and Tests Ordered: Current medicines are reviewed at length with the patient today.  Concerns regarding medicines are outlined above.  Orders Placed This Encounter  Procedures   LONG TERM MONITOR (3-14 DAYS)   EKG 12-Lead   ECHOCARDIOGRAM COMPLETE   Meds ordered this encounter  Medications   rosuvastatin (CRESTOR) 5 MG tablet    Sig: Take 1 tablet (5 mg total) by mouth 3 (three) times a week.    Dispense:  36 tablet    Refill:  3    Patient Instructions  Medication Instructions:   START Rosuvastatin one (1) tablet by mouth (5 mg ) three times weekly.  *If you need a refill on your cardiac medications before your next appointment, please call your pharmacy*   Lab Work:  None ordered.  If you have labs (blood work) drawn today and your tests are completely normal, you will receive your results only by: MyChart Message (if you have MyChart) OR A paper  copy in  the mail If you have any lab test that is abnormal or we need to change your treatment, we will call you to review the results.   Testing/Procedures:  Your physician has requested that you have an echocardiogram. Echocardiography is a painless test that uses sound waves to create images of your heart. It provides your doctor with information about the size and shape of your heart and how well your heart's chambers and valves are working. This procedure takes approximately one hour. There are no restrictions for this procedure. Please do NOT wear cologne, perfume, or lotions (deodorant is allowed). Please arrive 15 minutes prior to your appointment time.   A zio monitor was ordered today. It will remain on for 14 days. You will then return monitor and event diary in provided box. It takes 1-2 weeks for report to be downloaded and returned to [place]. We will call you with the results. If monitor falls off or has orange flashing light, please call Zio for further instructions.   Follow-Up: At [facility], you and your health needs are our priority.  As part of our continuing mission to provide you with exceptional heart care, we have created designated Provider Care Teams.  These Care Teams include your primary Cardiologist (physician) and Advanced Practice Providers (APPs -  Physician Assistants and Nurse Practitioners) who all work together to provide you with the care you need, when you need it.  We recommend signing up for the patient portal called "MyChart".  Sign up information is provided on this After Visit Summary.  MyChart is used to connect with patients for Virtual Visits (Telemedicine).  Patients are able to view lab/test results, encounter notes, upcoming appointments, etc.  Non-urgent messages can be sent to your provider as well.   To learn more about what you can do with MyChart, go to [email or web address].    Your next appointment:   3 month(s)  Provider:    Gypsy Balsam, MD        Signed, Levi Aland, NP  05/31/2022 10:59 AM    [facility]

## 2022-05-31 ENCOUNTER — Ambulatory Visit: Payer: Medicare Other | Attending: Nurse Practitioner | Admitting: Nurse Practitioner

## 2022-05-31 ENCOUNTER — Encounter: Payer: Self-pay | Admitting: Nurse Practitioner

## 2022-05-31 ENCOUNTER — Ambulatory Visit (INDEPENDENT_AMBULATORY_CARE_PROVIDER_SITE_OTHER): Payer: Medicare Other

## 2022-05-31 VITALS — BP 132/70 | HR 69 | Ht 63.5 in | Wt 142.0 lb

## 2022-05-31 DIAGNOSIS — I340 Nonrheumatic mitral (valve) insufficiency: Secondary | ICD-10-CM | POA: Diagnosis not present

## 2022-05-31 DIAGNOSIS — I10 Essential (primary) hypertension: Secondary | ICD-10-CM | POA: Diagnosis not present

## 2022-05-31 DIAGNOSIS — R002 Palpitations: Secondary | ICD-10-CM | POA: Diagnosis not present

## 2022-05-31 DIAGNOSIS — E785 Hyperlipidemia, unspecified: Secondary | ICD-10-CM

## 2022-05-31 MED ORDER — ROSUVASTATIN CALCIUM 5 MG PO TABS: 5.0000 mg | ORAL_TABLET | ORAL | 3 refills | Status: DC

## 2022-05-31 NOTE — Progress Notes (Unsigned)
Applied a 14 day Zio XT monitor to patient in the office  Dr Bing Matter to read

## 2022-05-31 NOTE — Patient Instructions (Signed)
Medication Instructions:   START Rosuvastatin one (1) tablet by mouth (5 mg ) three times weekly.  *If you need a refill on your cardiac medications before your next appointment, please call your pharmacy*   Lab Work:  None ordered.  If you have labs (blood work) drawn today and your tests are completely normal, you will receive your results only by: MyChart Message (if you have MyChart) OR A paper copy in the mail If you have any lab test that is abnormal or we need to change your treatment, we will call you to review the results.   Testing/Procedures:  Your physician has requested that you have an echocardiogram. Echocardiography is a painless test that uses sound waves to create images of your heart. It provides your doctor with information about the size and shape of your heart and how well your heart's chambers and valves are working. This procedure takes approximately one hour. There are no restrictions for this procedure. Please do NOT wear cologne, perfume, or lotions (deodorant is allowed). Please arrive 15 minutes prior to your appointment time.   A zio monitor was ordered today. It will remain on for 14 days. You will then return monitor and event diary in provided box. It takes 1-2 weeks for report to be downloaded and returned to [place]. We will call you with the results. If monitor falls off or has orange flashing light, please call Zio for further instructions.   Follow-Up: At [facility], you and your health needs are our priority.  As part of our continuing mission to provide you with exceptional heart care, we have created designated Provider Care Teams.  These Care Teams include your primary Cardiologist (physician) and Advanced Practice Providers (APPs -  Physician Assistants and Nurse Practitioners) who all work together to provide you with the care you need, when you need it.  We recommend signing up for the patient portal called "MyChart".  Sign up  information is provided on this After Visit Summary.  MyChart is used to connect with patients for Virtual Visits (Telemedicine).  Patients are able to view lab/test results, encounter notes, upcoming appointments, etc.  Non-urgent messages can be sent to your provider as well.   To learn more about what you can do with MyChart, go to [email or web address].    Your next appointment:   3 month(s)  Provider:   Gypsy Balsam, MD

## 2022-06-11 ENCOUNTER — Ambulatory Visit: Payer: Medicare Other | Attending: Cardiology

## 2022-06-11 DIAGNOSIS — I340 Nonrheumatic mitral (valve) insufficiency: Secondary | ICD-10-CM

## 2022-06-11 DIAGNOSIS — R002 Palpitations: Secondary | ICD-10-CM

## 2022-06-11 LAB — ECHOCARDIOGRAM COMPLETE
Area-P 1/2: 3.82 cm2
S' Lateral: 2.1 cm

## 2022-06-17 DIAGNOSIS — H524 Presbyopia: Secondary | ICD-10-CM | POA: Diagnosis not present

## 2022-06-19 DIAGNOSIS — I340 Nonrheumatic mitral (valve) insufficiency: Secondary | ICD-10-CM | POA: Diagnosis not present

## 2022-06-19 DIAGNOSIS — R002 Palpitations: Secondary | ICD-10-CM | POA: Diagnosis not present

## 2022-07-03 IMAGING — MR MR HEAD WO/W CM
14 of 16 series · 42 of 48 positions shown · IV contrast (gadavist)
Comparison: None.

CLINICAL DATA: Left-sided headache.  History of ovarian cancer.

EXAM:
MRI HEAD WITHOUT AND WITH CONTRAST
TECHNIQUE: Multiplanar, multiecho pulse sequences of the brain and surrounding
structures were obtained without and with intravenous contrast.
CONTRAST:  6mL GADAVIST GADOBUTROL 1 MMOL/ML IV SOLN

[Series 5: DWI · axial · 3.0mm · 0.88mm/px · z∈[-146,-5]mm · 8 of 104 slices shown (1 of 4)]
[im 1/104]
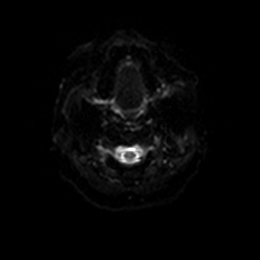
[im 15/104]
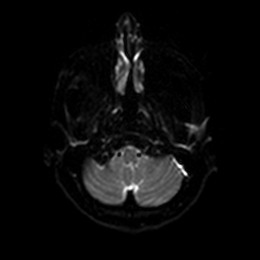
[im 30/104]
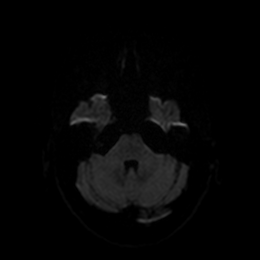
[im 45/104]
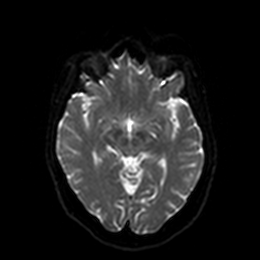
[im 59/104]
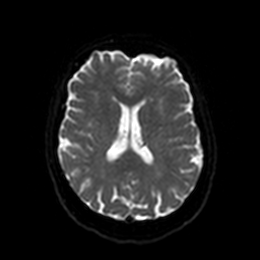
[im 74/104]
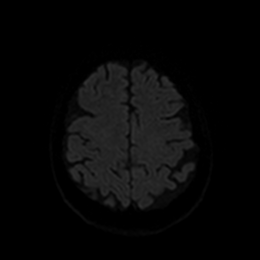
[im 89/104]
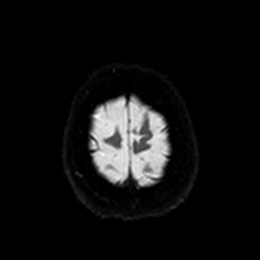
[im 104/104]
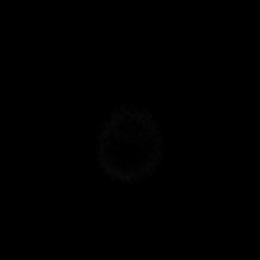

[Series 6: DWI · axial · 3.0mm · 0.88mm/px · z∈[-146,-5]mm · 3 of 52 slices shown (2 of 4)]
[im 1/52]
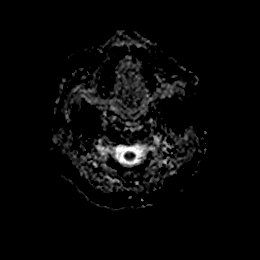
[im 26/52]
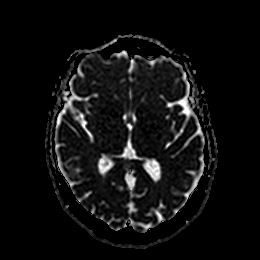
[im 52/52]
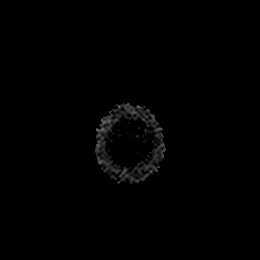

[Series 7: DWI · coronal · 4.0mm · 0.88mm/px · 5 of 70 slices shown (3 of 4)]
[im 1/70]
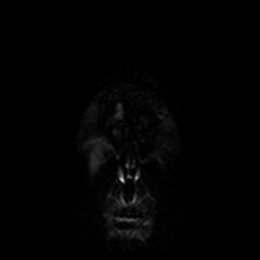
[im 18/70]
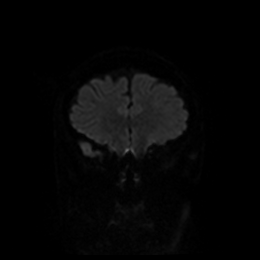
[im 35/70]
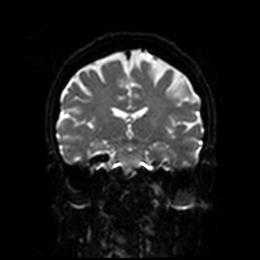
[im 52/70]
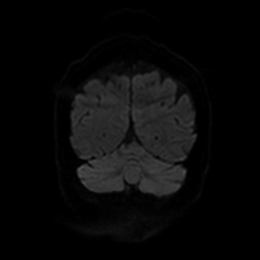
[im 70/70]
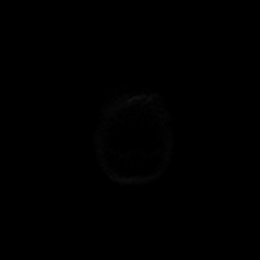

[Series 8: DWI · coronal · 4.0mm · 0.88mm/px · 2 of 35 slices shown (4 of 4)]
[im 1/35]
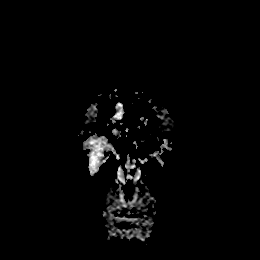
[im 35/35]
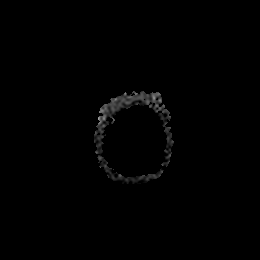

[Series 9: T1 · sagittal · 5.0mm · 0.75mm/px · 2 of 25 slices shown]
[im 1/25]
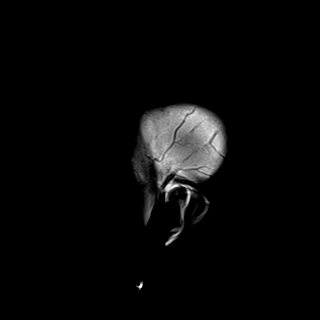
[im 25/25]
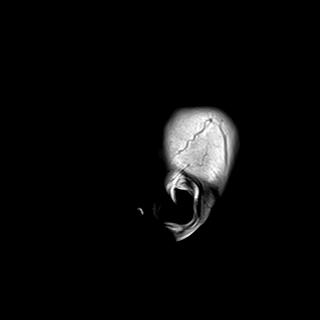

[Series 10: T2 · axial · 5.0mm · 0.72mm/px · z∈[-140,-7]mm · 2 of 25 slices shown]
[im 1/25]
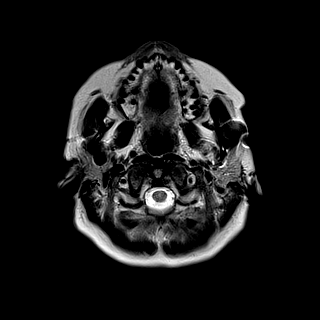
[im 25/25]
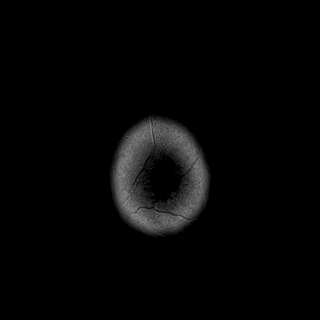

[Series 11: FLAIR · axial · 5.0mm · 0.45mm/px · z∈[-139,-7]mm · 2 of 25 slices shown]
[im 1/25]
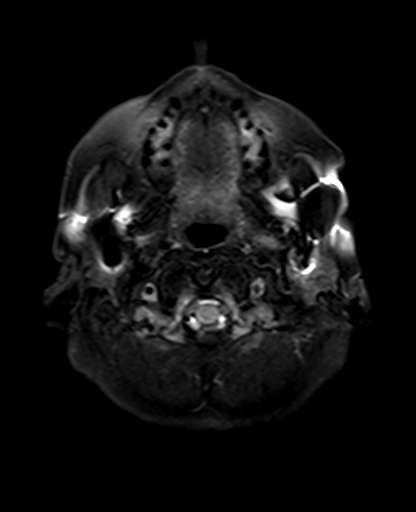
[im 25/25]
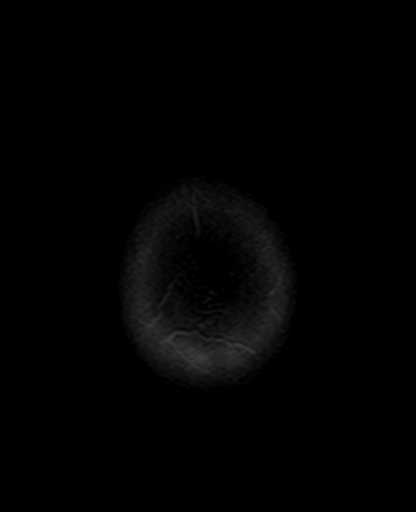

[Series 12: mag_images · axial · 3.0mm · 0.90mm/px · z∈[-143,-3]mm · 3 of 52 slices shown]
[im 1/52]
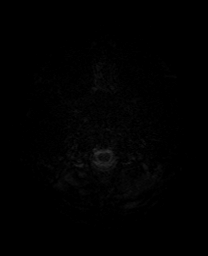
[im 26/52]
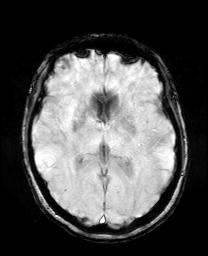
[im 52/52]
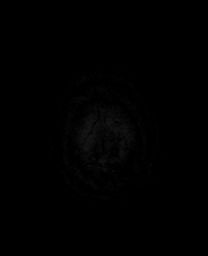

[Series 13: pha_images · axial · 3.0mm · 0.90mm/px · z∈[-143,-3]mm · 3 of 52 slices shown]
[im 1/52]
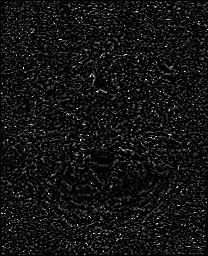
[im 26/52]
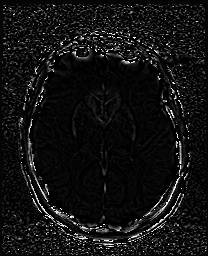
[im 52/52]
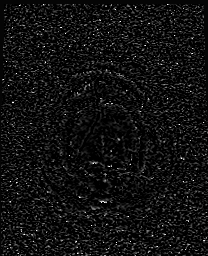

[Series 14: swi_images · axial · 3.0mm · 0.90mm/px · z∈[-143,-3]mm · 3 of 52 slices shown]
[im 1/52]
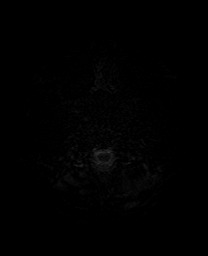
[im 26/52]
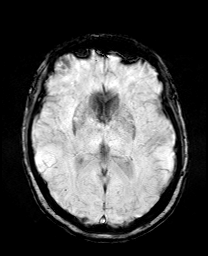
[im 52/52]
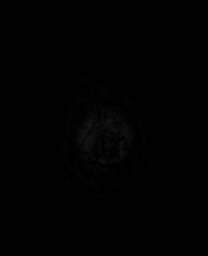

[Series 15: mip_images(sw) · axial · 24.0mm · 0.90mm/px · z∈[-134,-12]mm · 3 of 45 slices shown]
[im 1/45]
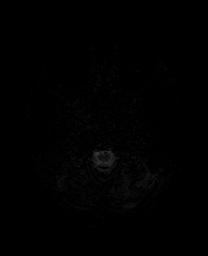
[im 23/45]
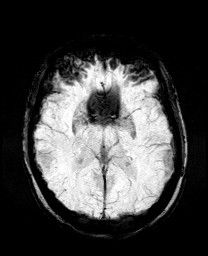
[im 45/45]
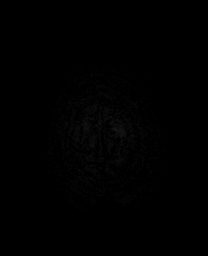

[Series 17: T2 post-contrast · coronal · 5.0mm · 0.72mm/px · 2 of 28 slices shown]
[im 1/28]
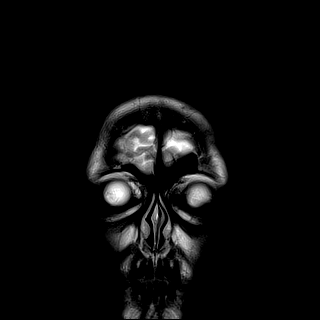
[im 28/28]
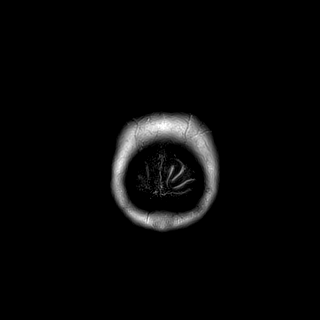

[Series 19: T1 post-contrast · coronal · 5.0mm · 0.34mm/px · 2 of 28 slices shown (1 of 2)]
[im 1/28]
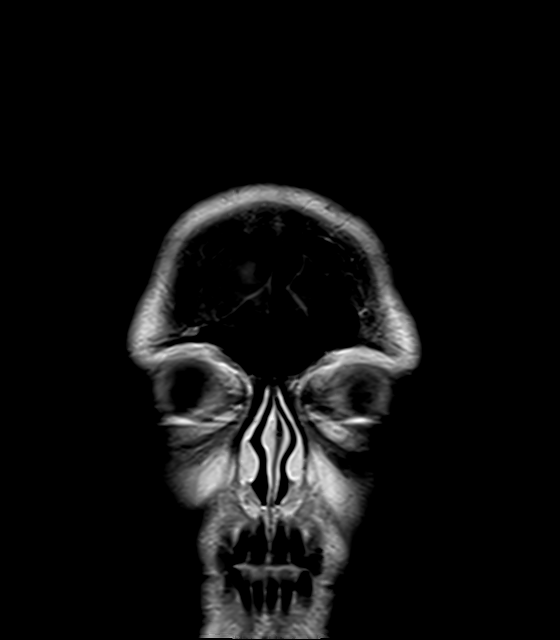
[im 28/28]
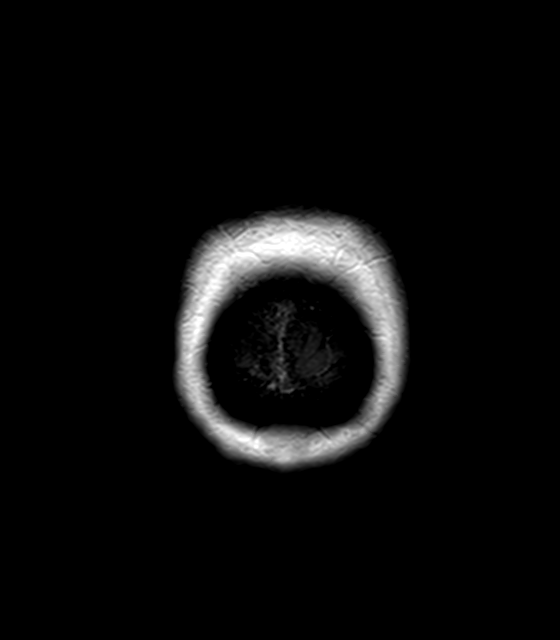

[Series 20: T1 post-contrast · sagittal · 5.0mm · 0.72mm/px · 2 of 25 slices shown (2 of 2)]
[im 1/25]
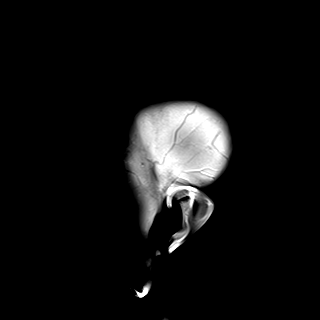
[im 25/25]
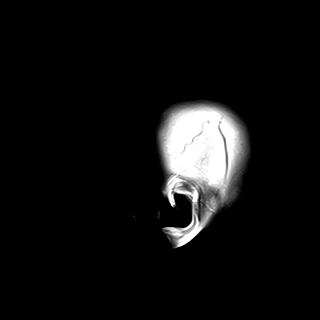

[42 of 48 positions shown; findings below may reference images not displayed]

FINDINGS: Brain: There is no evidence of an acute infarct, mass, midline
shift, or extra-axial fluid collection. A single chronic
microhemorrhage is noted in the right centrum semiovale. A few
punctate foci of T2 hyperintensity in the cerebral white matter are
not advanced for age. The ventricles and sulci are within normal
limits for age. No abnormal enhancement is identified.

Vascular: Major intracranial vascular flow voids are preserved.

Skull and upper cervical spine: Unremarkable bone marrow signal.

Sinuses/Orbits: Bilateral cataract extraction. Minimal bilateral
ethmoid sinus mucosal thickening. Clear mastoid air cells.

Other: Postsurgical changes involving the mandible bilaterally.
IMPRESSION: Essentially unremarkable appearance of the brain for age. No acute
intracranial abnormality or mass.

## 2022-07-05 ENCOUNTER — Other Ambulatory Visit: Payer: Self-pay | Admitting: Family Medicine

## 2022-07-08 ENCOUNTER — Other Ambulatory Visit: Payer: Self-pay | Admitting: Nurse Practitioner

## 2022-07-08 MED ORDER — NEBIVOLOL HCL 5 MG PO TABS: 5.0000 mg | ORAL_TABLET | Freq: Every day | ORAL | 11 refills | Status: DC

## 2022-08-07 DIAGNOSIS — N3091 Cystitis, unspecified with hematuria: Secondary | ICD-10-CM | POA: Diagnosis not present

## 2022-08-07 DIAGNOSIS — R300 Dysuria: Secondary | ICD-10-CM | POA: Diagnosis not present

## 2022-08-08 DIAGNOSIS — L570 Actinic keratosis: Secondary | ICD-10-CM | POA: Diagnosis not present

## 2022-08-08 DIAGNOSIS — L821 Other seborrheic keratosis: Secondary | ICD-10-CM | POA: Diagnosis not present

## 2022-08-08 DIAGNOSIS — D224 Melanocytic nevi of scalp and neck: Secondary | ICD-10-CM | POA: Diagnosis not present

## 2022-08-08 DIAGNOSIS — L565 Disseminated superficial actinic porokeratosis (DSAP): Secondary | ICD-10-CM | POA: Diagnosis not present

## 2022-08-25 DIAGNOSIS — N3090 Cystitis, unspecified without hematuria: Secondary | ICD-10-CM | POA: Diagnosis not present

## 2022-08-25 DIAGNOSIS — R300 Dysuria: Secondary | ICD-10-CM | POA: Diagnosis not present

## 2022-08-26 ENCOUNTER — Other Ambulatory Visit: Payer: Self-pay | Admitting: Family Medicine

## 2022-08-26 DIAGNOSIS — F419 Anxiety disorder, unspecified: Secondary | ICD-10-CM

## 2022-09-02 ENCOUNTER — Telehealth: Payer: Self-pay | Admitting: Family Medicine

## 2022-09-02 ENCOUNTER — Telehealth: Payer: Self-pay

## 2022-09-02 NOTE — Telephone Encounter (Signed)
Pt called today to request a same day appointment for the following symptoms:UTI. PT REQUESTED TO SENT TO A NURSE LINE.   Unfortunately, our schedule is full and we have no openings between today or tomorrow. Pt was notified that they can wait on a call from our triage or they can do an e-visit through MyChart with a [facility] Provider from home if they have the ability .

## 2022-09-02 NOTE — Telephone Encounter (Signed)
Patient informed and she states she can't come in the morning because she is going out of town till next week. She states if she is having it again she will call us when she gets back from her vacation.

## 2022-09-02 NOTE — Telephone Encounter (Signed)
Patient called stating that she has UTI symptoms and was wanting an appointment and patient was told we have no available appointments and recommend she go to Urgent care. Patient did not want to go to urgent care and asked if we could send message to provider asking either to be worked in or something called in because she states her provider knows about her frequent UTI's. Please advise.

## 2022-09-09 ENCOUNTER — Ambulatory Visit: Payer: Medicare Other | Attending: Cardiology | Admitting: Cardiology

## 2022-09-09 ENCOUNTER — Encounter: Payer: Self-pay | Admitting: Cardiology

## 2022-09-09 VITALS — BP 116/78 | HR 56 | Ht 63.0 in | Wt 141.8 lb

## 2022-09-09 DIAGNOSIS — E782 Mixed hyperlipidemia: Secondary | ICD-10-CM | POA: Diagnosis not present

## 2022-09-09 DIAGNOSIS — R002 Palpitations: Secondary | ICD-10-CM | POA: Diagnosis not present

## 2022-09-09 DIAGNOSIS — I10 Essential (primary) hypertension: Secondary | ICD-10-CM

## 2022-09-09 DIAGNOSIS — I471 Supraventricular tachycardia, unspecified: Secondary | ICD-10-CM | POA: Diagnosis not present

## 2022-09-09 DIAGNOSIS — K080 Exfoliation of teeth due to systemic causes: Secondary | ICD-10-CM | POA: Diagnosis not present

## 2022-09-09 NOTE — Patient Instructions (Signed)
 Medication Instructions:  Your physician recommends that you continue on your current medications as directed. Please refer to the Current Medication list given to you today.  *If you need a refill on your cardiac medications before your next appointment, please call your pharmacy*   Lab Work: Lipid, AST, ALT- today If you have labs (blood work) drawn today and your tests are completely normal, you will receive your results only by: [place] (if you have MyChart) OR A paper copy in the mail If you have any lab test that is abnormal or we need to change your treatment, we will call you to review the results.   Testing/Procedures: None Ordered   Follow-Up: At [facility], you and your health needs are our priority.  As part of our continuing mission to provide you with exceptional heart care, we have created designated Provider Care Teams.  These Care Teams include your primary Cardiologist (physician) and Advanced Practice Providers (APPs -  Physician Assistants and Nurse Practitioners) who all work together to provide you with the care you need, when you need it.  We recommend signing up for the patient portal called "MyChart".  Sign up information is provided on this After Visit Summary.  MyChart is used to connect with patients for Virtual Visits (Telemedicine).  Patients are able to view lab/test results, encounter notes, upcoming appointments, etc.  Non-urgent messages can be sent to your provider as well.   To learn more about what you can do with MyChart, go to [email or web address].    Your next appointment:   6 month(s)  The format for your next appointment:   In Person  Provider:   Jenne Campus, MD    Other Instructions NA

## 2022-09-09 NOTE — Addendum Note (Signed)
Addended by: Baldo Ash D on: 09/09/2022 10:45 AM   Modules accepted: Orders

## 2022-09-09 NOTE — Progress Notes (Signed)
Cardiology Office Note:    Date:  09/09/2022   ID:  Dana [name], DOB 1945-04-10, MRN [id number]  PCP:  Blane Ohara, MD  Cardiologist:  Gypsy Balsam, MD    Referring MD: Blane Ohara, MD   Chief Complaint  Patient presents with   Follow-up  Doing well  History of Present Illness:    Dana [name] is a 77 y.o. female past medical history significant for essential hypertension seems to be lipid difficult to control, dyslipidemia with intolerance to statin, hypothyroidism, cluster headaches, recently recognized supraventricular tachycardia.  Last time she was seen by our nurse practitioner.  She did wear monitor showed multiple episode of supraventricular tachycardia, Bystolic at lower dose has been restarted and she started feeling better denies have any palpitation no chest pain tightness squeezing pressure burning chest.  However, before her medication has been changed she described 1 situation was talking to her husband and suddenly became very dizzy to the point that she had to go on her knee then very quickly recovered she did not completely passed out she did not feel any palpitations after that time medication has been adjusted and she does not have any more of the sensation.  Still trying to be active walking 3-5 times a week for about 45 minutes and enjoying it now however seems very so hard sometimes she skips a walker within  Past Medical History:  Diagnosis Date   Anxiety disorder 07/19/2019   Chronic back pain 03/10/2018   Depression    Dyslipidemia 07/28/2019   Dyspnea on exertion 03/19/2019   Essential hypertension 03/19/2019   History of ovarian cancer    Hyperlipidemia 04/2015   Hypertension 02/2019   Hypothyroidism 03/10/2018   Impingement syndrome of right shoulder 05/12/2014   Insomnia due to other mental disorder 07/19/2019   Lumbar back pain 07/20/2019   Mixed hyperlipidemia 03/19/2019   Right shoulder pain 05/12/2014   Sciatic nerve pain, left 07/19/2019    Sciatic nerve pain, right 07/19/2019    Past Surgical History:  Procedure Laterality Date   ABDOMINAL HYSTERECTOMY  1986   Total, also had chemo   APPENDECTOMY  1986   LAPAROTOMY  1987   To rule out recurrent ovarian cancer   TONSILLECTOMY      Current Medications: Current Meds  Medication Sig   ALPRAZolam (XANAX) 0.25 MG tablet TAKE 1 TABLET BY MOUTH AT BEDTIME AS NEEDED FOR ANXIETY. (Patient taking differently: Take 0.25 mg by mouth at bedtime as needed for anxiety or sleep. TAKE 1 TABLET BY MOUTH AT BEDTIME AS NEEDED FOR ANXIETY.)   cetirizine (ZYRTEC) 10 MG tablet Take 10 mg by mouth daily.   ezetimibe (ZETIA) 10 MG tablet Take 1 tablet (10 mg total) by mouth daily.   hydrochlorothiazide (HYDRODIURIL) 25 MG tablet TAKE 1 TABLET BY MOUTH DAILY.   levothyroxine (SYNTHROID) 88 MCG tablet TAKE 1 TABLET BY MOUTH ONCE DAILY (Patient taking differently: Take 88 mcg by mouth daily before breakfast.)   meloxicam (MOBIC) 7.5 MG tablet Take 1 tablet (7.5 mg total) by mouth 2 (two) times daily as needed for pain.   nebivolol (BYSTOLIC) 5 MG tablet Take 1 tablet (5 mg total) by mouth daily.   PREMARIN 0.625 MG tablet Take 1 tablet (0.625 mg total) by mouth daily.   rosuvastatin (CRESTOR) 5 MG tablet Take 1 tablet (5 mg total) by mouth 3 (three) times a week.     Allergies:   Lipitor [atorvastatin], Penicillamine, Topamax [topiramate], Penicillins, and Pravastatin  Social History   Socioeconomic History   Marital status: Married    Spouse name: Dana [name]   Number of children: Not on file   Years of education: Not on file   Highest education level: Not on file  Occupational History   Not on file  Tobacco Use   Smoking status: Never   Smokeless tobacco: Never  Vaping Use   Vaping status: Never Used  Substance and Sexual Activity   Alcohol use: Yes    Alcohol/week: 1.0 standard drink of alcohol    Types: 1 Glasses of wine per week    Comment: occasionally   Drug use: Not Currently     Types: Solvent inhalants   Sexual activity: Yes    Partners: Male  Other Topics Concern   Not on file  Social History Narrative   Not on file   Social Determinants of Health   Financial Resource Strain: Not on file  Food Insecurity: No Food Insecurity (03/07/2020)   Hunger Vital Sign    Worried About Running Out of Food in the Last Year: Never true    Ran Out of Food in the Last Year: Never true  Transportation Needs: No Transportation Needs (03/07/2020)   PRAPARE - Administrator, Civil Service (Medical): No    Lack of Transportation (Non-Medical): No  Physical Activity: Insufficiently Active (03/07/2020)   Exercise Vital Sign    Days of Exercise per Week: 1 day    Minutes of Exercise per Session: 30 min  Stress: Not on file  Social Connections: Not on file     Family History: The patient's family history includes Congestive Heart Failure in her mother; Osteoarthritis in her mother. ROS:   Please see the history of present illness.    All 14 point review of systems negative except as described per history of present illness  EKGs/Labs/Other Studies Reviewed:         Recent Labs: 03/18/2022: ALT 9; BUN 15; Creatinine, Ser 0.79; Hemoglobin 13.9; Platelets 337; Potassium 4.4; Sodium 140; TSH 1.900  Recent Lipid Panel    Component Value Date/Time   CHOL 229 (H) 03/18/2022 0904   TRIG 77 03/18/2022 0904   HDL 75 03/18/2022 0904   CHOLHDL 3.1 03/18/2022 0904   LDLCALC 141 (H) 03/18/2022 0904    Physical Exam:    VS:  BP 116/78 (BP Location: Left Arm, Patient Position: Sitting)   Pulse (!) 56   Ht 5\' 3"  (1.6 m)   Wt 141 lb 12.8 oz (64.3 kg)   LMP  (LMP Unknown)   SpO2 98%   BMI 25.12 kg/m     Wt Readings from Last 3 Encounters:  09/09/22 141 lb 12.8 oz (64.3 kg)  05/31/22 142 lb (64.4 kg)  03/18/22 140 lb (63.5 kg)     GEN:  Well nourished, well developed in no acute distress HEENT: Normal NECK: No JVD; No carotid bruits LYMPHATICS: No  lymphadenopathy CARDIAC: RRR, no murmurs, no rubs, no gallops RESPIRATORY:  Clear to auscultation without rales, wheezing or rhonchi  ABDOMEN: Soft, non-tender, non-distended MUSCULOSKELETAL:  No edema; No deformity  SKIN: Warm and dry LOWER EXTREMITIES: no swelling NEUROLOGIC:  Alert and oriented x 3 PSYCHIATRIC:  Normal affect   ASSESSMENT:    1. Palpitations   2. Supraventricular tachycardia   3. Essential hypertension   4. Mixed hyperlipidemia    PLAN:    In order of problems listed above:  Palpitations.  Successfully managed with small dose of beta-blocker will  continue monitoring. Supraventricular tachycardia responding to beta-blocker quite nicely. Essential hypertension blood pressure well-controlled continue present management. Mixed dyslipidemia she is on Zetia as well as Crestor 3 times a week.  Will recheck her fasting lipid profile to make sure that the management is sufficient. Episode of near syncope medication has been adjusted thereafter.  I asked her to let me know if she have another episode like that monitor did not show any arrhythmia and A-fib There is episodes   Medication Adjustments/Labs and Tests Ordered: Current medicines are reviewed at length with the patient today.  Concerns regarding medicines are outlined above.  Orders Placed This Encounter  Procedures   EKG 12-Lead   Medication changes: No orders of the defined types were placed in this encounter.   Signed, Georgeanna Lea, MD, [facility] 09/09/2022 10:38 AM    [facility]

## 2022-09-10 DIAGNOSIS — H524 Presbyopia: Secondary | ICD-10-CM | POA: Diagnosis not present

## 2022-09-10 DIAGNOSIS — H35033 Hypertensive retinopathy, bilateral: Secondary | ICD-10-CM | POA: Diagnosis not present

## 2022-09-10 DIAGNOSIS — H40013 Open angle with borderline findings, low risk, bilateral: Secondary | ICD-10-CM | POA: Diagnosis not present

## 2022-09-10 DIAGNOSIS — H1045 Other chronic allergic conjunctivitis: Secondary | ICD-10-CM | POA: Diagnosis not present

## 2022-09-10 DIAGNOSIS — H33321 Round hole, right eye: Secondary | ICD-10-CM | POA: Diagnosis not present

## 2022-09-10 LAB — LIPID PANEL
Chol/HDL Ratio: 2.1 ratio (ref 0.0–4.4)
Cholesterol, Total: 189 mg/dL (ref 100–199)
HDL: 88 mg/dL (ref >39)
LDL Chol Calc (NIH): 84 mg/dL (ref 0–99)
Triglycerides: 94 mg/dL (ref 0–149)
VLDL Cholesterol Cal: 17 mg/dL (ref 5–40)

## 2022-09-10 LAB — ALT: ALT: 11 IU/L (ref 0–32)

## 2022-09-10 LAB — AST: AST: 18 IU/L (ref 0–40)

## 2022-09-12 ENCOUNTER — Telehealth: Payer: Self-pay

## 2022-09-12 NOTE — Telephone Encounter (Signed)
Left message on My Chart with normal results per Dr. Krasowski's note. Routed to PCP. 

## 2022-09-13 ENCOUNTER — Telehealth: Payer: Self-pay

## 2022-09-13 NOTE — Telephone Encounter (Signed)
Pt viewed results in My Chart per Dr. Krasowski's note. Routed to PCP.  

## 2022-09-23 NOTE — Progress Notes (Unsigned)
Subjective:   Dana [name] is a 77 y.o. female who presents for Medicare Annual (Subsequent) preventive examination.  Visit Complete: In person  Patient Medicare AWV questionnaire was completed by the patient on 09/20/2022; I have confirmed that all information answered by patient is correct and no changes since this date.  Hyperlipidemia: Current medications: Zetia 10mg  taking one tablet daily, Rosuvastatin 5 mg take 3 times a week. Tolerating well.   Hypertension: HCTZ 25mg  take 1 tablet daily, Nebivolol HCL 5 mg takes 1 tablet daily. Bp is great today. Eat healthy.  Exercise: walking at least 2-3 times per week. .   Hypothyroidism: Current medications: Levothyroxine 88 mcg take 1 tablet daily.  Anxiety: Current medications: Xanax 0.25mg  take 1/2 tablet daily at night  Patient was having severe cluster headaches in December 2022 and made an appointment in August 2023 with Neurology, but cancelled due ot resolution of headaches. Her headaches are back. Daily. Sharp.  Review of Systems    Review of Systems  Constitutional:  Negative for chills, fever and malaise/fatigue.  HENT:  Negative for ear pain, sinus pain and sore throat.   Respiratory:  Negative for cough and shortness of breath.   Cardiovascular:  Negative for chest pain, palpitations and leg swelling.  Gastrointestinal:  Negative for abdominal pain, constipation, diarrhea, nausea and vomiting.  Genitourinary:  Negative for dysuria.  Musculoskeletal:  Negative for back pain, joint pain and myalgias.       Right ankle pain and swelling.  Neurological:  Positive for headaches (daily cluster headache).  Psychiatric/Behavioral:  Negative for depression. The patient is not nervous/anxious.           Objective:    Today's Vitals   09/24/22 0825  BP: 110/70  Pulse: 72  Resp: 14  Temp: (!) 97.3 F (36.3 C)  Weight: 142 lb (64.4 kg)  Height: 5\' 3"  (1.6 m)   Body mass index is 25.15 kg/m.  Physical  Exam Vitals reviewed.  Constitutional:      General: She is not in acute distress.    Appearance: Normal appearance. She is normal weight.  HENT:     Right Ear: Tympanic membrane and ear canal normal.     Left Ear: Tympanic membrane and ear canal normal.     Nose: Nose normal. No congestion or rhinorrhea.  Eyes:     Conjunctiva/sclera: Conjunctivae normal.  Neck:     Thyroid: No thyroid mass.     Vascular: No carotid bruit.  Cardiovascular:     Rate and Rhythm: Normal rate and regular rhythm.     Pulses: Normal pulses.     Heart sounds: Normal heart sounds. No murmur heard. Pulmonary:     Effort: Pulmonary effort is normal.     Breath sounds: Normal breath sounds.  Abdominal:     General: Bowel sounds are normal.     Palpations: Abdomen is soft. There is no mass.     Tenderness: There is no abdominal tenderness.  Musculoskeletal:        General: Swelling (lateral rt ankle.) and tenderness (rt lateral ankle.) present.  Lymphadenopathy:     Cervical: No cervical adenopathy.  Skin:    General: Skin is warm and dry.  Neurological:     Mental Status: She is alert and oriented to person, place, and time.     Cranial Nerves: No cranial nerve deficit.  Psychiatric:        Mood and Affect: Mood normal.  Behavior: Behavior normal.         09/05/2020    9:00 AM 07/29/2019   11:03 AM  Advanced Directives  Does Patient Have a Medical Advance Directive? Yes Yes  Type of Estate agent of [place];Living will Healthcare Power of [place];Living will  Does patient want to make changes to medical advance directive? No - Patient declined No - Patient declined  Copy of Healthcare Power of Attorney in Chart? No - copy requested No - copy requested    Current Medications (verified) Outpatient Encounter Medications as of 09/24/2022  Medication Sig   ALPRAZolam (XANAX) 0.25 MG tablet TAKE 1 TABLET BY MOUTH AT BEDTIME AS NEEDED FOR ANXIETY. (Patient taking  differently: Take 0.25 mg by mouth at bedtime as needed for anxiety or sleep. TAKE 1 TABLET BY MOUTH AT BEDTIME AS NEEDED FOR ANXIETY.)   cetirizine (ZYRTEC) 10 MG tablet Take 10 mg by mouth daily.   ezetimibe (ZETIA) 10 MG tablet Take 1 tablet (10 mg total) by mouth daily.   hydrochlorothiazide (HYDRODIURIL) 25 MG tablet TAKE 1 TABLET BY MOUTH DAILY.   meloxicam (MOBIC) 7.5 MG tablet Take 1 tablet (7.5 mg total) by mouth 2 (two) times daily as needed for pain.   nebivolol (BYSTOLIC) 5 MG tablet Take 1 tablet (5 mg total) by mouth daily.   PREMARIN 0.625 MG tablet Take 1 tablet (0.625 mg total) by mouth daily.   rosuvastatin (CRESTOR) 5 MG tablet Take 1 tablet (5 mg total) by mouth 3 (three) times a week.   [DISCONTINUED] levothyroxine (SYNTHROID) 88 MCG tablet TAKE 1 TABLET BY MOUTH ONCE DAILY (Patient taking differently: Take 88 mcg by mouth daily before breakfast.)   No facility-administered encounter medications on file as of 09/24/2022.    Allergies (verified) Lipitor [atorvastatin], Penicillamine, Topamax [topiramate], Penicillins, and Pravastatin   History: Past Medical History:  Diagnosis Date   Anxiety disorder 07/19/2019   Chronic back pain 03/10/2018   Depression    Dyslipidemia 07/28/2019   Dyspnea on exertion 03/19/2019   Essential hypertension 03/19/2019   History of ovarian cancer    Hyperlipidemia 04/2015   Hypertension 02/2019   Hypothyroidism 03/10/2018   Impingement syndrome of right shoulder 05/12/2014   Insomnia due to other mental disorder 07/19/2019   Lumbar back pain 07/20/2019   Mixed hyperlipidemia 03/19/2019   Right shoulder pain 05/12/2014   Sciatic nerve pain, left 07/19/2019   Sciatic nerve pain, right 07/19/2019   Past Surgical History:  Procedure Laterality Date   ABDOMINAL HYSTERECTOMY  1986   Total, also had chemo   APPENDECTOMY  1986   LAPAROTOMY  1987   To rule out recurrent ovarian cancer   TONSILLECTOMY     Family History  Problem Relation Age of  Onset   Congestive Heart Failure Mother    Osteoarthritis Mother    Social History   Socioeconomic History   Marital status: Married    Spouse name: Roe Coombs   Number of children: Not on file   Years of education: Not on file   Highest education level: Not on file  Occupational History   Not on file  Tobacco Use   Smoking status: Never   Smokeless tobacco: Never  Vaping Use   Vaping status: Never Used  Substance and Sexual Activity   Alcohol use: Yes    Alcohol/week: 1.0 standard drink of alcohol    Types: 1 Glasses of wine per week    Comment: occasionally   Drug use: Not Currently  Types: Solvent inhalants   Sexual activity: Yes    Partners: Male  Other Topics Concern   Not on file  Social History Narrative   Not on file   Social Determinants of Health   Financial Resource Strain: Low Risk  (09/24/2022)   Overall Financial Resource Strain (CARDIA)    Difficulty of Paying Living Expenses: Not hard at all  Food Insecurity: No Food Insecurity (09/24/2022)   Hunger Vital Sign    Worried About Running Out of Food in the Last Year: Never true    Ran Out of Food in the Last Year: Never true  Transportation Needs: No Transportation Needs (09/24/2022)   PRAPARE - Administrator, Civil Service (Medical): No    Lack of Transportation (Non-Medical): No  Physical Activity: Insufficiently Active (09/24/2022)   Exercise Vital Sign    Days of Exercise per Week: 3 days    Minutes of Exercise per Session: 40 min  Stress: No Stress Concern Present (09/24/2022)   [employer] of Occupational Health - Occupational Stress Questionnaire    Feeling of Stress : Only a little  Social Connections: Socially Integrated (09/24/2022)   Social Connection and Isolation Panel [NHANES]    Frequency of Communication with Friends and Family: More than three times a week    Frequency of Social Gatherings with Friends and Family: More than three times a week    Attends Religious Services:  More than 4 times per year    Active Member of [employer] or Organizations: Yes    Attends Engineer, structural: More than 4 times per year    Marital Status: Married    Tobacco Counseling Counseling given: Not Answered   Clinical Intake:  Pre-visit preparation completed: Yes  Pain : No/denies pain        How often do you need to have someone help you when you read instructions, pamphlets, or other written materials from your doctor or pharmacy?: 1 - Never         Activities of Daily Living    09/20/2022    3:19 PM  In your present state of health, do you have any difficulty performing the following activities:  Hearing? 0  Vision? 0  Difficulty concentrating or making decisions? 0  Walking or climbing stairs? 0  Dressing or bathing? 0  Doing errands, shopping? 0  Preparing Food and eating ? N  Using the Toilet? N  In the past six months, have you accidently leaked urine? Y  Do you have problems with loss of bowel control? N  Managing your Medications? N  Managing your Finances? N  Housekeeping or managing your Housekeeping? N    Patient Care Team: Blane Ohara, MD as PCP - General (Family Medicine) Georgeanna Lea, MD as Consulting Physician (Cardiology) Corrin Parker. (Inactive) as Referring Physician (Obstetrics and Gynecology) Mateo Flow, MD as Consulting Physician (Ophthalmology)  Indicate any recent Medical Services you may have received from other than [facility] providers in the past year (date may be approximate).     Assessment:   This is a routine wellness examination for Saidee.  Encounter for Medicare annual wellness exam Assessment & Plan: Education given.   Essential hypertension Assessment & Plan: Well controlled.  No changes to medicines. Continue HCTZ 25mg  take 1 tablet daily, Nebivolol HCL 5 mg takes 1 tablet dail Continue to work on eating a healthy diet and exercise.  Labs drawn today.    Orders: -  CBC with  Differential/Platelet -     Comprehensive metabolic panel  Mixed hyperlipidemia Assessment & Plan: Well controlled.  No changes to medicines. Continue Zetia 10mg  taking one tablet daily, Rosuvastatin 5 mg take 3 times a week. Continue to work on eating a healthy diet and exercise.  Labs drawn today.     Hypothyroidism due to acquired atrophy of thyroid Assessment & Plan: Previously well controlled Continue Synthroid at current dose  Recheck TSH and adjust Synthroid as indicated     Prediabetes Assessment & Plan: Continue to work on healthy diet and exercise.    Orders: -     Hemoglobin A1c  Need for hepatitis C screening test -     Hepatitis C antibody  Ocular migraine Assessment & Plan: Referral to Neurology. Nurtec samples given for headache.  Orders: -     Ambulatory referral to Neurology  Acute right ankle pain Assessment & Plan: Order for xray of right ankle. Ice and elevate  Orders: -     DG Ankle Complete Right; Future  Urinary retention Assessment & Plan: Referral to Urology. Urinalysis normal.  Orders: -     POCT URINALYSIS DIP (CLINITEK) -     Ambulatory referral to Urology  Visit for screening mammogram -     Digital Screening Mammogram, Left and Right; Future    Hearing/Vision screen No results found.  Dietary issues and exercise activities discussed:     Goals Addressed   None   Depression Screen    09/24/2022    8:14 AM 09/24/2022    8:12 AM 09/19/2021    1:26 PM 11/10/2020    8:41 AM 09/05/2020    9:11 AM 08/02/2020    8:42 AM 04/21/2020    7:34 AM  PHQ 2/9 Scores  PHQ - 2 Score 0 0 0 0 0 0 0  PHQ- 9 Score 2          Fall Risk    09/24/2022    8:14 AM 09/20/2022    3:19 PM 09/17/2021    3:02 PM 11/10/2020    8:41 AM 09/05/2020    9:10 AM  Fall Risk   Falls in the past year? 0 0 0 0 0  Number falls in past yr: 0  0 0 0  Injury with Fall? 0  0 0 0  Risk for fall due to : No Fall Risks No Fall Risks No Fall Risks No Fall  Risks No Fall Risks  Follow up Falls evaluation completed;Falls prevention discussed Falls evaluation completed Falls evaluation completed;Education provided Falls evaluation completed Falls evaluation completed    MEDICARE RISK AT HOME:   TIMED UP AND GO:  Was the test performed?  Yes  Length of time to ambulate 10 feet: 5 sec Gait steady and fast without use of assistive device    Cognitive Function:        09/19/2021    1:27 PM 09/05/2020    9:12 AM 07/29/2019   11:36 AM  6CIT Screen  What Year? 0 points 0 points 0 points  What month? 0 points 0 points 0 points  What time? 0 points 0 points 0 points  Count back from 20 0 points 0 points 0 points  Months in reverse 0 points 0 points 0 points  Repeat phrase 0 points 0 points 0 points  Total Score 0 points 0 points 0 points    Immunizations Immunization History  Administered Date(s) Administered   Fluad Quad(high Dose 65+) 11/10/2020  Influenza Inj Mdck Quad Pf 11/05/2019   Influenza, High Dose Seasonal PF 12/17/2021   Influenza-Unspecified 11/25/2017, 11/30/2018   PFIZER Comirnaty(Gray Top)Covid-19 Tri-Sucrose Vaccine 03/13/2019, 04/03/2019, 11/26/2019, 06/05/2020   Pfizer Covid-19 Vaccine Bivalent Booster 64yrs & up 12/18/2020   Pfizer Fall 2023 Covid-19 Vaccine 35yrs thru 70yrs. 12/17/2021   Pneumococcal Conjugate-13 05/13/2013, 05/30/2014   Pneumococcal Polysaccharide-23 12/18/2006, 08/03/2011   Tdap 03/11/2013   Zoster Recombinant(Shingrix) 11/06/2017, 02/03/2018   Zoster, Live 10/23/2007    TDAP status: Up to date  Flu Vaccine status: Up to date  Pneumococcal vaccine status: Up to date  Covid-19 vaccine status: Completed vaccines  Qualifies for Shingles Vaccine? Yes   Zostavax completed Yes   Shingrix Completed?: Yes  Screening Tests Health Maintenance  Topic Date Due   MAMMOGRAM  09/06/2022   INFLUENZA VACCINE  09/26/2022   COVID-19 Vaccine (6 - 2023-24 season) 12/10/2022 (Originally 04/19/2022)    DTaP/Tdap/Td (2 - Td or Tdap) 03/12/2023   Medicare Annual Wellness (AWV)  09/27/2023   Pneumonia Vaccine 89+ Years old  Completed   DEXA SCAN  Completed   Hepatitis C Screening  Completed   Zoster Vaccines- Shingrix  Completed   HPV VACCINES  Aged Out   Colonoscopy  Discontinued   Fecal DNA (Cologuard)  Discontinued    Health Maintenance  Health Maintenance Due  Topic Date Due   MAMMOGRAM  09/06/2022   INFLUENZA VACCINE  09/26/2022    Refused colonoscopy.  Hepatitis C Screening: does qualify; Completed today  Vision Screening: Recommended annual ophthalmology exams for early detection of glaucoma and other disorders of the eye. Is the patient up to date with their annual eye exam?  Yes   Dental Screening: Recommended annual dental exams for proper oral hygiene     Plan:     I have personally reviewed and noted the following in the patient's chart:   Medical and social history Use of alcohol, tobacco or illicit drugs  Current medications and supplements including opioid prescriptions. Patient is not currently taking opioid prescriptions. Functional ability and status Nutritional status Physical activity Advanced directives List of other physicians Hospitalizations, surgeries, and ER visits in previous 12 months Vitals Screenings to include cognitive, depression, and falls Referrals and appointments  In addition, I have reviewed and discussed with patient certain preventive protocols, quality metrics, and best practice recommendations. A written personalized care plan for preventive services as well as general preventive health recommendations were provided to patient.     Blane Ohara, MD   09/29/2022

## 2022-09-23 NOTE — Progress Notes (Signed)
[facility] - Clinic Note  09/25/2022   CHIEF COMPLAINT Patient presents for Retina Evaluation  HISTORY OF PRESENT ILLNESS: Dana [name] is a 77 y.o. female who presents to the clinic today for:  HPI     Retina Evaluation   In right eye.  This started 1 week ago.  Duration of 1 week.  I, the attending physician,  performed the HPI with the patient and updated documentation appropriately.        Comments   Retina eval per Dr Bascom Levels operculated tear OD pt is reporting no vision changes denies any flashes or floaters       Last edited by Rennis Chris, MD on 09/25/2022 10:37 AM.    Patient denies noticing vision changes. She admits to having floaters.  Referring physician: [place], [place], OD [address] [place],  [place] [address]  HISTORICAL INFORMATION:  Selected notes from the MEDICAL RECORD NUMBER Referred by Dr. Bascom Levels for operculated retinal hole OD LEE:  Ocular Hx- PMH-   CURRENT MEDICATIONS: Current Outpatient Medications (Ophthalmic Drugs)  Medication Sig   prednisoLONE acetate (PRED FORTE) 1 % ophthalmic suspension Place 1 drop into the right eye 4 (four) times daily for 7 days.   No current facility-administered medications for this visit. (Ophthalmic Drugs)   Current Outpatient Medications (Other)  Medication Sig   ALPRAZolam (XANAX) 0.25 MG tablet TAKE 1 TABLET BY MOUTH AT BEDTIME AS NEEDED FOR ANXIETY. (Patient taking differently: Take 0.25 mg by mouth at bedtime as needed for anxiety or sleep. TAKE 1 TABLET BY MOUTH AT BEDTIME AS NEEDED FOR ANXIETY.)   cetirizine (ZYRTEC) 10 MG tablet Take 10 mg by mouth daily.   ezetimibe (ZETIA) 10 MG tablet Take 1 tablet (10 mg total) by mouth daily.   hydrochlorothiazide (HYDRODIURIL) 25 MG tablet TAKE 1 TABLET BY MOUTH DAILY.   levothyroxine (SYNTHROID) 88 MCG tablet TAKE 1 TABLET BY MOUTH ONCE DAILY   meloxicam (MOBIC) 7.5 MG tablet Take 1 tablet (7.5 mg total) by mouth 2 (two)  times daily as needed for pain.   nebivolol (BYSTOLIC) 5 MG tablet Take 1 tablet (5 mg total) by mouth daily.   PREMARIN 0.625 MG tablet Take 1 tablet (0.625 mg total) by mouth daily.   rosuvastatin (CRESTOR) 5 MG tablet Take 1 tablet (5 mg total) by mouth 3 (three) times a week.   No current facility-administered medications for this visit. (Other)   REVIEW OF SYSTEMS: ROS   Positive for: Cardiovascular, Psychiatric Last edited by Etheleen Mayhew, COT on 09/25/2022  9:08 AM.     ALLERGIES Allergies  Allergen Reactions   Lipitor [Atorvastatin]     myalgia   Penicillamine     Other reaction(s): Not available   Topamax [Topiramate] Other (See Comments)    BP decreased   Penicillins Rash    Other reaction(s): RASH    Pravastatin Other (See Comments)    Myalgia   PAST MEDICAL HISTORY Past Medical History:  Diagnosis Date   Anxiety disorder 07/19/2019   Chronic back pain 03/10/2018   Depression    Dyslipidemia 07/28/2019   Dyspnea on exertion 03/19/2019   Essential hypertension 03/19/2019   History of ovarian cancer    Hyperlipidemia 04/2015   Hypertension 02/2019   Hypothyroidism 03/10/2018   Impingement syndrome of right shoulder 05/12/2014   Insomnia due to other mental disorder 07/19/2019   Lumbar back pain 07/20/2019   Mixed hyperlipidemia 03/19/2019   Right shoulder pain 05/12/2014  Sciatic nerve pain, left 07/19/2019   Sciatic nerve pain, right 07/19/2019   Past Surgical History:  Procedure Laterality Date   ABDOMINAL HYSTERECTOMY  1986   Total, also had chemo   APPENDECTOMY  1986   LAPAROTOMY  1987   To rule out recurrent ovarian cancer   TONSILLECTOMY     FAMILY HISTORY Family History  Problem Relation Age of Onset   Congestive Heart Failure Mother    Osteoarthritis Mother    SOCIAL HISTORY Social History   Tobacco Use   Smoking status: Never   Smokeless tobacco: Never  Vaping Use   Vaping status: Never Used  Substance Use Topics   Alcohol use:  Yes    Alcohol/week: 1.0 standard drink of alcohol    Types: 1 Glasses of wine per week    Comment: occasionally   Drug use: Not Currently    Types: Solvent inhalants       OPHTHALMIC EXAM:  Base Eye Exam     Visual Acuity (Snellen - Linear)       Right Left   Dist cc 20/20 20/20    Correction: Glasses         Tonometry (Tonopen, 9:11 AM)       Right Left   Pressure 15 17         Pupils       Pupils Dark Light Shape React APD   Right PERRL 3 2 Round Brisk None   Left PERRL 3 2 Round Brisk None         Visual Fields       Left Right    Full Full         Extraocular Movement       Right Left    Full, Ortho Full, Ortho         Neuro/Psych     Mood/Affect: Normal         Dilation     Both eyes: 2.5% Phenylephrine @ 9:11 AM           Slit Lamp and Fundus Exam     External Exam       Right Left   External Normal Normal         Slit Lamp Exam       Right Left   Lids/Lashes Dermatochalasis - upper lid, Meibomian gland dysfunction Dermatochalasis - upper lid, Meibomian gland dysfunction   Conjunctiva/Sclera White and quiet White and quiet   Cornea Arcus, 1+ Punctate epithelial erosions, Well healed temporal cataract wound Arcus, 1+ Punctate epithelial erosions, Well healed temporal cataract wound   Anterior Chamber Deep and clear Deep and clear   Iris Round and dilated Round and dilated   Lens 3 piece IOL with open PC 3 piece IOL with open PC   Anterior Vitreous Vitreous syneresis, no pigment, Posterior vitreous detachment Vitreous syneresis, no pigment, Posterior vitreous detachment, Vitreous condensations         Fundus Exam       Right Left   Disc Trace pallor, sharp rim, mild PPA Trace pallor, sharp rim, mild PPA   C/D Ratio 0.6 0.6   Macula Flat, Good foveal reflex, RPE mottling, No heme or edema Flat, Blunted foveal reflex, Retinal pigment epithelial mottling, No heme or edema   Vessels Vascular attenuation, Tortuous  Vascular attenuation, Tortuous   Periphery Punctate pigmented operculated hole at 1:30, no SRF; attached; no heme attached; no heme           Refraction  Wearing Rx       Sphere Cylinder Axis Add   Right -0.75 +0.75 006 +2.50   Left -1.50 +0.75 155 +2.50           IMAGING AND PROCEDURES  Imaging and Procedures for 09/25/2022  OCT, Retina - OU - Both Eyes       Right Eye Quality was good. Central Foveal Thickness: 283. Progression has no prior data. Findings include normal foveal contour, no IRF, no SRF (Mild ERM).   Left Eye Quality was good. Central Foveal Thickness: 282. Progression has no prior data. Findings include normal foveal contour, no IRF, no SRF.   Notes *Images captured and stored on drive  Diagnosis / Impression:  NFP; no IRF/SRF OU OD: Mild ERM   Clinical management:  See below  Abbreviations: NFP - Normal foveal profile. CME - cystoid macular edema. PED - pigment epithelial detachment. IRF - intraretinal fluid. SRF - subretinal fluid. EZ - ellipsoid zone. ERM - epiretinal membrane. ORA - outer retinal atrophy. ORT - outer retinal tubulation. SRHM - subretinal hyper-reflective material. IRHM - intraretinal hyper-reflective material      Repair Retinal Breaks, Laser - OD - Right Eye       LASER PROCEDURE NOTE  Procedure:  Barrier laser retinopexy using slit lamp laser, RIGHT eye   Diagnosis:   Retinal hole, RIGHT eye                     Operculated retinal hole tear at 130 o'clock anterior to equator   Surgeon: Rennis Chris, MD, PhD  Anesthesia: Topical  Informed consent obtained, operative eye marked, and time out performed prior to initiation of laser.   Laser settings:  Lumenis Smart532 laser, slit lamp Lens: Mainster PRP 165 Power: 270 mW Spot size: 200 microns Duration: 30 msec  # spots: 105  Placement of laser: Using a Mainster PRP 165 contact lens at the slit lamp, laser was placed in three confluent rows around  operculated retinal hole at 130 oclock anterior to equator  Complications: None.  Patient tolerated the procedure well and received written and verbal post-procedure care information/education.           ASSESSMENT/PLAN:   ICD-10-CM   1. Retinal hole of right eye  H33.321 OCT, Retina - OU - Both Eyes    Repair Retinal Breaks, Laser - OD - Right Eye    2. Essential hypertension  I10     3. Hypertensive retinopathy of both eyes  H35.033 OCT, Retina - OU - Both Eyes    4. Pseudophakia of both eyes  Z96.1      Retinal hole, OD.   - The incidence, risk factors, and natural history of retinal tear was discussed with patient.   - Potential treatment options including laser retinopexy and cryotherapy discussed with patient. - pigmented, operculated retinal hole at 0130 -- no SRF; asymptomatic - found on routine exam - recommend laser retinopexy OD today 07.31.24 - RBA of procedure discussed, questions answered - informed consent obtained and signed - see procedure note - start PF QID x7 days, rx sent to pharmacy on file - f/u in 2-3 wks , DFE, OCT  2,3. Hypertensive retinopathy OU - discussed importance of tight BP control - OCT shows WNL OU - monitor   4. Pseudophakia OU  - s/p CE/IOL OU w/ Dr. Elmer Picker  - IOL in good position, doing well  - monitor   Ophthalmic Meds Ordered this visit:  Meds ordered  this encounter  Medications   prednisoLONE acetate (PRED FORTE) 1 % ophthalmic suspension    Sig: Place 1 drop into the right eye 4 (four) times daily for 7 days.    Dispense:  1.4 mL    Refill:  0     Return in about 3 weeks (around 10/16/2022) for f/u RT OD , DFE, OCT.  There are no Patient Instructions on file for this visit.  Explained the diagnoses, plan, and follow up with the patient and they expressed understanding.  Patient expressed understanding of the importance of proper follow up care.   This document serves as a record of services personally performed by  Karie Chimera, MD, PhD. It was created on their behalf by De Blanch, an ophthalmic technician. The creation of this record is the provider's dictation and/or activities during the visit.    Electronically signed by: De Blanch, OA, 09/25/22  10:44 AM This document serves as a record of services personally performed by Karie Chimera, MD, PhD. It was created on their behalf by Gerilyn Nestle, COT an ophthalmic technician. The creation of this record is the provider's dictation and/or activities during the visit.    Electronically signed by:  Charlette Caffey, COT  09/25/22 10:44 AM  Karie Chimera, M.D., Ph.D. Diseases & Surgery of the Retina and Vitreous [facility] 09/25/2022  I have reviewed the above documentation for accuracy and completeness, and I agree with the above. Karie Chimera, M.D., Ph.D. 09/25/22 10:46 AM   Abbreviations: M myopia (nearsighted); A astigmatism; H hyperopia (farsighted); P presbyopia; Mrx spectacle prescription;  CTL contact lenses; OD right eye; OS left eye; OU both eyes  XT exotropia; ET esotropia; PEK punctate epithelial keratitis; PEE punctate epithelial erosions; DES dry eye syndrome; MGD meibomian gland dysfunction; ATs artificial tears; PFAT's preservative free artificial tears; NSC nuclear sclerotic cataract; PSC posterior subcapsular cataract; ERM epi-retinal membrane; PVD posterior vitreous detachment; RD retinal detachment; DM diabetes mellitus; DR diabetic retinopathy; NPDR non-proliferative diabetic retinopathy; PDR proliferative diabetic retinopathy; CSME clinically significant macular edema; DME diabetic macular edema; dbh dot blot hemorrhages; CWS cotton wool spot; POAG primary open angle glaucoma; C/D cup-to-disc ratio; HVF humphrey visual field; GVF goldmann visual field; OCT optical coherence tomography; IOP intraocular pressure; BRVO Branch retinal vein occlusion; CRVO central retinal vein occlusion; CRAO  central retinal artery occlusion; BRAO branch retinal artery occlusion; RT retinal tear; SB scleral buckle; PPV pars plana vitrectomy; VH Vitreous hemorrhage; PRP panretinal laser photocoagulation; IVK intravitreal kenalog; VMT vitreomacular traction; MH Macular hole;  NVD neovascularization of the disc; NVE neovascularization elsewhere; AREDS age related eye disease study; ARMD age related macular degeneration; POAG primary open angle glaucoma; EBMD epithelial/anterior basement membrane dystrophy; ACIOL anterior chamber intraocular lens; IOL intraocular lens; PCIOL posterior chamber intraocular lens; Phaco/IOL phacoemulsification with intraocular lens placement; PRK photorefractive keratectomy; LASIK laser assisted in situ keratomileusis; HTN hypertension; DM diabetes mellitus; COPD chronic obstructive pulmonary disease

## 2022-09-24 ENCOUNTER — Ambulatory Visit (INDEPENDENT_AMBULATORY_CARE_PROVIDER_SITE_OTHER): Payer: Medicare Other | Admitting: Family Medicine

## 2022-09-24 ENCOUNTER — Encounter: Payer: Self-pay | Admitting: Family Medicine

## 2022-09-24 ENCOUNTER — Other Ambulatory Visit: Payer: Self-pay | Admitting: Family Medicine

## 2022-09-24 VITALS — BP 110/70 | HR 72 | Temp 97.3°F | Resp 14 | Ht 63.0 in | Wt 142.0 lb

## 2022-09-24 DIAGNOSIS — R339 Retention of urine, unspecified: Secondary | ICD-10-CM

## 2022-09-24 DIAGNOSIS — S93401A Sprain of unspecified ligament of right ankle, initial encounter: Secondary | ICD-10-CM | POA: Diagnosis not present

## 2022-09-24 DIAGNOSIS — Z1231 Encounter for screening mammogram for malignant neoplasm of breast: Secondary | ICD-10-CM

## 2022-09-24 DIAGNOSIS — M7989 Other specified soft tissue disorders: Secondary | ICD-10-CM | POA: Diagnosis not present

## 2022-09-24 DIAGNOSIS — G43109 Migraine with aura, not intractable, without status migrainosus: Secondary | ICD-10-CM

## 2022-09-24 DIAGNOSIS — Z0000 Encounter for general adult medical examination without abnormal findings: Secondary | ICD-10-CM

## 2022-09-24 DIAGNOSIS — E782 Mixed hyperlipidemia: Secondary | ICD-10-CM | POA: Diagnosis not present

## 2022-09-24 DIAGNOSIS — Z1159 Encounter for screening for other viral diseases: Secondary | ICD-10-CM

## 2022-09-24 DIAGNOSIS — M7731 Calcaneal spur, right foot: Secondary | ICD-10-CM | POA: Diagnosis not present

## 2022-09-24 DIAGNOSIS — E034 Atrophy of thyroid (acquired): Secondary | ICD-10-CM

## 2022-09-24 DIAGNOSIS — R7303 Prediabetes: Secondary | ICD-10-CM | POA: Diagnosis not present

## 2022-09-24 DIAGNOSIS — M25571 Pain in right ankle and joints of right foot: Secondary | ICD-10-CM

## 2022-09-24 DIAGNOSIS — I10 Essential (primary) hypertension: Secondary | ICD-10-CM

## 2022-09-24 DIAGNOSIS — M7661 Achilles tendinitis, right leg: Secondary | ICD-10-CM | POA: Diagnosis not present

## 2022-09-24 LAB — COMPREHENSIVE METABOLIC PANEL
ALT: 12 IU/L (ref 0–32)
AST: 14 IU/L (ref 0–40)
Albumin: 4.2 g/dL (ref 3.8–4.8)
Alkaline Phosphatase: 58 IU/L (ref 44–121)
BUN/Creatinine Ratio: 22 (ref 12–28)
BUN: 17 mg/dL (ref 8–27)
Bilirubin Total: 0.4 mg/dL (ref 0.0–1.2)
CO2: 25 mmol/L (ref 20–29)
Calcium: 9.7 mg/dL (ref 8.7–10.3)
Chloride: 101 mmol/L (ref 96–106)
Creatinine, Ser: 0.77 mg/dL (ref 0.57–1.00)
Globulin, Total: 2.1 g/dL (ref 1.5–4.5)
Glucose: 108 mg/dL — ABNORMAL HIGH (ref 70–99)
Potassium: 4.5 mmol/L (ref 3.5–5.2)
Sodium: 142 mmol/L (ref 134–144)
Total Protein: 6.3 g/dL (ref 6.0–8.5)
eGFR: 80 mL/min/{1.73_m2} (ref >59)

## 2022-09-24 LAB — CBC WITH DIFFERENTIAL/PLATELET
Basophils Absolute: 0.1 10*3/uL (ref 0.0–0.2)
Basos: 1 %
EOS (ABSOLUTE): 0.2 10*3/uL (ref 0.0–0.4)
Eos: 3 %
Hematocrit: 41.7 % (ref 34.0–46.6)
Hemoglobin: 13.7 g/dL (ref 11.1–15.9)
Immature Grans (Abs): 0 10*3/uL (ref 0.0–0.1)
Immature Granulocytes: 0 %
Lymphocytes Absolute: 1.9 10*3/uL (ref 0.7–3.1)
Lymphs: 24 %
MCH: 30.5 pg (ref 26.6–33.0)
MCHC: 32.9 g/dL (ref 31.5–35.7)
MCV: 93 fL (ref 79–97)
Monocytes Absolute: 0.6 10*3/uL (ref 0.1–0.9)
Monocytes: 7 %
Neutrophils Absolute: 5.2 10*3/uL (ref 1.4–7.0)
Neutrophils: 65 %
Platelets: 339 10*3/uL (ref 150–450)
RBC: 4.49 x10E6/uL (ref 3.77–5.28)
RDW: 12.8 % (ref 11.7–15.4)
WBC: 8 10*3/uL (ref 3.4–10.8)

## 2022-09-24 LAB — POCT URINALYSIS DIP (CLINITEK)
Bilirubin, UA: NEGATIVE
Blood, UA: NEGATIVE
Glucose, UA: NEGATIVE mg/dL
Ketones, POC UA: NEGATIVE mg/dL
Leukocytes, UA: NEGATIVE
Nitrite, UA: NEGATIVE
POC PROTEIN,UA: NEGATIVE
Spec Grav, UA: <=1.005 — AB (ref 1.010–1.025)
Urobilinogen, UA: 0.2 E.U./dL
pH, UA: 7 (ref 5.0–8.0)

## 2022-09-24 LAB — HEMOGLOBIN A1C
Est. average glucose Bld gHb Est-mCnc: 126 mg/dL
Hgb A1c MFr Bld: 6 % — ABNORMAL HIGH (ref 4.8–5.6)

## 2022-09-24 NOTE — Patient Instructions (Signed)
Referral to Neurology. Referral to Urology. Nurtec samples given for headache. Order for xray of right ankle. Labs drawn today.

## 2022-09-25 ENCOUNTER — Ambulatory Visit (INDEPENDENT_AMBULATORY_CARE_PROVIDER_SITE_OTHER): Payer: Medicare Other | Admitting: Ophthalmology

## 2022-09-25 ENCOUNTER — Encounter (INDEPENDENT_AMBULATORY_CARE_PROVIDER_SITE_OTHER): Payer: Self-pay | Admitting: Ophthalmology

## 2022-09-25 DIAGNOSIS — Z961 Presence of intraocular lens: Secondary | ICD-10-CM | POA: Diagnosis not present

## 2022-09-25 DIAGNOSIS — H3581 Retinal edema: Secondary | ICD-10-CM

## 2022-09-25 DIAGNOSIS — I10 Essential (primary) hypertension: Secondary | ICD-10-CM | POA: Diagnosis not present

## 2022-09-25 DIAGNOSIS — H35033 Hypertensive retinopathy, bilateral: Secondary | ICD-10-CM

## 2022-09-25 DIAGNOSIS — H33321 Round hole, right eye: Secondary | ICD-10-CM

## 2022-09-25 MED ORDER — PREDNISOLONE ACETATE 1 % OP SUSP: 1.0000 [drp] | Freq: Four times a day (QID) | OPHTHALMIC | 0 refills | Status: AC

## 2022-09-27 ENCOUNTER — Encounter: Payer: Self-pay | Admitting: Family Medicine

## 2022-09-27 DIAGNOSIS — R339 Retention of urine, unspecified: Secondary | ICD-10-CM | POA: Insufficient documentation

## 2022-09-27 DIAGNOSIS — M25571 Pain in right ankle and joints of right foot: Secondary | ICD-10-CM | POA: Insufficient documentation

## 2022-09-27 DIAGNOSIS — Z0000 Encounter for general adult medical examination without abnormal findings: Secondary | ICD-10-CM | POA: Insufficient documentation

## 2022-09-27 DIAGNOSIS — R7303 Prediabetes: Secondary | ICD-10-CM | POA: Insufficient documentation

## 2022-09-27 DIAGNOSIS — Z1159 Encounter for screening for other viral diseases: Secondary | ICD-10-CM | POA: Insufficient documentation

## 2022-09-27 DIAGNOSIS — Z1231 Encounter for screening mammogram for malignant neoplasm of breast: Secondary | ICD-10-CM | POA: Diagnosis not present

## 2022-09-27 LAB — HM MAMMOGRAPHY

## 2022-09-27 NOTE — Assessment & Plan Note (Signed)
Referral to Urology. Urinalysis normal.

## 2022-09-27 NOTE — Assessment & Plan Note (Signed)
Well controlled.  No changes to medicines. Continue HCTZ 25mg  take 1 tablet daily, Nebivolol HCL 5 mg takes 1 tablet dail Continue to work on eating a healthy diet and exercise.  Labs drawn today.

## 2022-09-27 NOTE — Assessment & Plan Note (Signed)
Continue to work on healthy diet and exercise.   

## 2022-09-27 NOTE — Assessment & Plan Note (Signed)
Previously well controlled Continue Synthroid at current dose  Recheck TSH and adjust Synthroid as indicated   

## 2022-09-27 NOTE — Assessment & Plan Note (Signed)
Education given. 

## 2022-09-27 NOTE — Assessment & Plan Note (Signed)
Order for xray of right ankle. Ice and elevate

## 2022-09-27 NOTE — Assessment & Plan Note (Signed)
Referral to Neurology. Nurtec samples given for headache.

## 2022-09-27 NOTE — Assessment & Plan Note (Signed)
Well controlled.  No changes to medicines. Continue Zetia 10mg  taking one tablet daily, Rosuvastatin 5 mg take 3 times a week. Continue to work on eating a healthy diet and exercise.  Labs drawn today.

## 2022-09-30 ENCOUNTER — Encounter: Payer: Self-pay | Admitting: Family Medicine

## 2022-10-01 NOTE — Progress Notes (Signed)
Ankle swelling. No fractures.  Patient notified.  Dr. Sedalia Muta

## 2022-10-01 NOTE — Progress Notes (Signed)
[facility] - Clinic Note  10/15/2022   CHIEF COMPLAINT Patient presents for Retina Follow Up  HISTORY OF PRESENT ILLNESS: Dana [name] is a 77 y.o. female who presents to the clinic today for:  HPI     Retina Follow Up   Patient presents with  Other.  In right eye.  This started 3 weeks ago.  I, the attending physician,  performed the HPI with the patient and updated documentation appropriately.        Comments   Patient here for 3 weeks retina follow up for RT OD. Patient states vision doing good. No eye pain. Using drops recommended by Dr Vanessa Barbara.      Last edited by Rennis Chris, MD on 10/15/2022 11:16 PM.    Patient states she had no problems after the laser procedure at her last visit, no new fol or floaters  Referring physician: Fraz[place], [place], OD [address] [place],  [place] [address]  HISTORICAL INFORMATION:  Selected notes from the MEDICAL RECORD NUMBER Referred by Dr. Bascom Levels for operculated retinal hole OD LEE:  Ocular Hx- PMH-   CURRENT MEDICATIONS: No current outpatient medications on file. (Ophthalmic Drugs)   No current facility-administered medications for this visit. (Ophthalmic Drugs)   Current Outpatient Medications (Other)  Medication Sig   ALPRAZolam (XANAX) 0.25 MG tablet TAKE 1 TABLET BY MOUTH AT BEDTIME AS NEEDED FOR ANXIETY. (Patient taking differently: Take 0.25 mg by mouth at bedtime as needed for anxiety or sleep. TAKE 1 TABLET BY MOUTH AT BEDTIME AS NEEDED FOR ANXIETY.)   cetirizine (ZYRTEC) 10 MG tablet Take 10 mg by mouth daily.   ezetimibe (ZETIA) 10 MG tablet Take 1 tablet (10 mg total) by mouth daily.   hydrochlorothiazide (HYDRODIURIL) 25 MG tablet TAKE 1 TABLET BY MOUTH DAILY.   levothyroxine (SYNTHROID) 88 MCG tablet TAKE 1 TABLET BY MOUTH ONCE DAILY   meloxicam (MOBIC) 7.5 MG tablet Take 1 tablet (7.5 mg total) by mouth 2 (two) times daily as needed for pain.   nebivolol (BYSTOLIC) 5 MG tablet  Take 1 tablet (5 mg total) by mouth daily.   PREMARIN 0.625 MG tablet Take 1 tablet (0.625 mg total) by mouth daily.   rosuvastatin (CRESTOR) 5 MG tablet Take 1 tablet (5 mg total) by mouth 3 (three) times a week.   No current facility-administered medications for this visit. (Other)   REVIEW OF SYSTEMS: ROS   Positive for: Cardiovascular, Psychiatric Last edited by Laddie Aquas, COA on 10/15/2022  9:11 AM.      ALLERGIES Allergies  Allergen Reactions   Lipitor [Atorvastatin]     myalgia   Penicillamine     Other reaction(s): Not available   Topamax [Topiramate] Other (See Comments)    BP decreased   Penicillins Rash    Other reaction(s): RASH    Pravastatin Other (See Comments)    Myalgia   PAST MEDICAL HISTORY Past Medical History:  Diagnosis Date   Anxiety disorder 07/19/2019   Chronic back pain 03/10/2018   Depression    Dyslipidemia 07/28/2019   Dyspnea on exertion 03/19/2019   Essential hypertension 03/19/2019   History of ovarian cancer    Hyperlipidemia 04/2015   Hypertension 02/2019   Hypothyroidism 03/10/2018   Impingement syndrome of right shoulder 05/12/2014   Insomnia due to other mental disorder 07/19/2019   Lumbar back pain 07/20/2019   Mixed hyperlipidemia 03/19/2019   Right shoulder pain 05/12/2014   Sciatic nerve pain, left  07/19/2019   Sciatic nerve pain, right 07/19/2019   Past Surgical History:  Procedure Laterality Date   ABDOMINAL HYSTERECTOMY  1986   Total, also had chemo   APPENDECTOMY  1986   LAPAROTOMY  1987   To rule out recurrent ovarian cancer   TONSILLECTOMY     FAMILY HISTORY Family History  Problem Relation Age of Onset   Congestive Heart Failure Mother    Osteoarthritis Mother    SOCIAL HISTORY Social History   Tobacco Use   Smoking status: Never   Smokeless tobacco: Never  Vaping Use   Vaping status: Never Used  Substance Use Topics   Alcohol use: Yes    Alcohol/week: 1.0 standard drink of alcohol    Types: 1 Glasses  of wine per week    Comment: occasionally   Drug use: Not Currently    Types: Solvent inhalants       OPHTHALMIC EXAM:  Base Eye Exam     Visual Acuity (Snellen - Linear)       Right Left   Dist cc 20/20 20/20    Correction: Glasses         Tonometry (Tonopen, 9:10 AM)       Right Left   Pressure 13 15         Pupils       Dark Light Shape React APD   Right 3 2 Round Brisk None   Left 3 2 Round Brisk None         Visual Fields (Counting fingers)       Left Right    Full Full         Extraocular Movement       Right Left    Full, Ortho Full, Ortho         Neuro/Psych     Oriented x3: Yes   Mood/Affect: Normal         Dilation     Both eyes: 1.0% Mydriacyl, 2.5% Phenylephrine @ 9:10 AM           Slit Lamp and Fundus Exam     External Exam       Right Left   External Normal Normal         Slit Lamp Exam       Right Left   Lids/Lashes Dermatochalasis - upper lid, Meibomian gland dysfunction Dermatochalasis - upper lid, Meibomian gland dysfunction   Conjunctiva/Sclera White and quiet White and quiet   Cornea Arcus, 1-2+ inferior Punctate epithelial erosions, Well healed temporal cataract wound Arcus, 1+ Punctate epithelial erosions, Well healed temporal cataract wound   Anterior Chamber Deep and clear Deep and clear   Iris Round and dilated Round and dilated   Lens 3 piece IOL with open PC 3 piece IOL with open PC   Anterior Vitreous mild syneresis, no pigment, Posterior vitreous detachment Vitreous syneresis, no pigment, Posterior vitreous detachment, Vitreous condensations         Fundus Exam       Right Left   Disc Trace pallor, sharp rim, mild PPA Trace pallor, sharp rim, mild PPA   C/D Ratio 0.6 0.6   Macula Flat, Good foveal reflex, RPE mottling, No heme or edema Flat, Blunted foveal reflex, Retinal pigment epithelial mottling, No heme or edema   Vessels attenuated, mild tortuosity Vascular attenuation, Tortuous    Periphery Attached, punctate pigmented operculated hole at 0130 -- good laser changes surrounding, no SRF; no heme, no new RT/RD attached; no heme  Refraction     Wearing Rx       Sphere Cylinder Axis Add   Right -0.75 +0.75 006 +2.50   Left -1.50 +0.75 155 +2.50           IMAGING AND PROCEDURES  Imaging and Procedures for 10/15/2022  OCT, Retina - OU - Both Eyes       Right Eye Quality was good. Central Foveal Thickness: 279. Progression has been stable. Findings include normal foveal contour, no IRF, no SRF (Mild ERM).   Left Eye Quality was good. Central Foveal Thickness: 336. Progression has been stable. Findings include normal foveal contour, no IRF, no SRF.   Notes *Images captured and stored on drive  Diagnosis / Impression:  NFP; no IRF/SRF OU OD: Mild ERM  Clinical management:  See below  Abbreviations: NFP - Normal foveal profile. CME - cystoid macular edema. PED - pigment epithelial detachment. IRF - intraretinal fluid. SRF - subretinal fluid. EZ - ellipsoid zone. ERM - epiretinal membrane. ORA - outer retinal atrophy. ORT - outer retinal tubulation. SRHM - subretinal hyper-reflective material. IRHM - intraretinal hyper-reflective material           ASSESSMENT/PLAN:   ICD-10-CM   1. Essential hypertension  I10     2. Retinal hole of right eye  H33.321 OCT, Retina - OU - Both Eyes    3. Hypertensive retinopathy of both eyes  H35.033 OCT, Retina - OU - Both Eyes    4. Pseudophakia of both eyes  Z96.1      Retinal hole, OD - pigmented, operculated retinal hole at 0130 -- no SRF; asymptomatic - found on routine exam - s/p laser retinopexy OD (07.31.24) -- good laser changes in place surrounding hole - completed PF QID x7 days - no new RT/RD - f/u in 3 months , DFE, OCT  2,3. Hypertensive retinopathy OU - discussed importance of tight BP control - OCT shows WNL OU - monitor   4. Pseudophakia OU  - s/p CE/IOL OU w/ Dr. Elmer Picker  -  IOL in good position, doing well  - monitor   Ophthalmic Meds Ordered this visit:  No orders of the defined types were placed in this encounter.    Return in about 3 months (around 01/15/2023) for f/u retinal hole OD, DFE, OCT.  There are no Patient Instructions on file for this visit.  Explained the diagnoses, plan, and follow up with the patient and they expressed understanding.  Patient expressed understanding of the importance of proper follow up care.   This document serves as a record of services personally performed by Karie Chimera, MD, PhD. It was created on their behalf by Laurey Morale, COT an ophthalmic technician. The creation of this record is the provider's dictation and/or activities during the visit.    Electronically signed by:  Charlette Caffey, COT  10/15/22 11:17 PM  This document serves as a record of services personally performed by Karie Chimera, MD, PhD. It was created on their behalf by Glee Arvin. Manson Passey, OA an ophthalmic technician. The creation of this record is the provider's dictation and/or activities during the visit.    Electronically signed by: Glee Arvin. Manson Passey, OA 10/15/22 11:17 PM  Karie Chimera, M.D., Ph.D. Diseases & Surgery of the Retina and Vitreous [facility] 10/15/2022  I have reviewed the above documentation for accuracy and completeness, and I agree with the above. Karie Chimera, M.D., Ph.D. 10/15/22 11:19 PM  Abbreviations: M myopia (nearsighted); A astigmatism; H hyperopia (farsighted); P presbyopia; Mrx spectacle prescription;  CTL contact lenses; OD right eye; OS left eye; OU both eyes  XT exotropia; ET esotropia; PEK punctate epithelial keratitis; PEE punctate epithelial erosions; DES dry eye syndrome; MGD meibomian gland dysfunction; ATs artificial tears; PFAT's preservative free artificial tears; NSC nuclear sclerotic cataract; PSC posterior subcapsular cataract; ERM epi-retinal membrane; PVD posterior  vitreous detachment; RD retinal detachment; DM diabetes mellitus; DR diabetic retinopathy; NPDR non-proliferative diabetic retinopathy; PDR proliferative diabetic retinopathy; CSME clinically significant macular edema; DME diabetic macular edema; dbh dot blot hemorrhages; CWS cotton wool spot; POAG primary open angle glaucoma; C/D cup-to-disc ratio; HVF humphrey visual field; GVF goldmann visual field; OCT optical coherence tomography; IOP intraocular pressure; BRVO Branch retinal vein occlusion; CRVO central retinal vein occlusion; CRAO central retinal artery occlusion; BRAO branch retinal artery occlusion; RT retinal tear; SB scleral buckle; PPV pars plana vitrectomy; VH Vitreous hemorrhage; PRP panretinal laser photocoagulation; IVK intravitreal kenalog; VMT vitreomacular traction; MH Macular hole;  NVD neovascularization of the disc; NVE neovascularization elsewhere; AREDS age related eye disease study; ARMD age related macular degeneration; POAG primary open angle glaucoma; EBMD epithelial/anterior basement membrane dystrophy; ACIOL anterior chamber intraocular lens; IOL intraocular lens; PCIOL posterior chamber intraocular lens; Phaco/IOL phacoemulsification with intraocular lens placement; PRK photorefractive keratectomy; LASIK laser assisted in situ keratomileusis; HTN hypertension; DM diabetes mellitus; COPD chronic obstructive pulmonary disease

## 2022-10-02 ENCOUNTER — Other Ambulatory Visit: Payer: Self-pay | Admitting: Family Medicine

## 2022-10-07 ENCOUNTER — Encounter: Payer: Self-pay | Admitting: Neurology

## 2022-10-15 ENCOUNTER — Encounter (INDEPENDENT_AMBULATORY_CARE_PROVIDER_SITE_OTHER): Payer: Self-pay | Admitting: Ophthalmology

## 2022-10-15 ENCOUNTER — Ambulatory Visit (INDEPENDENT_AMBULATORY_CARE_PROVIDER_SITE_OTHER): Payer: Medicare Other | Admitting: Ophthalmology

## 2022-10-15 DIAGNOSIS — H35033 Hypertensive retinopathy, bilateral: Secondary | ICD-10-CM

## 2022-10-15 DIAGNOSIS — I10 Essential (primary) hypertension: Secondary | ICD-10-CM

## 2022-10-15 DIAGNOSIS — Z961 Presence of intraocular lens: Secondary | ICD-10-CM | POA: Diagnosis not present

## 2022-10-15 DIAGNOSIS — H33321 Round hole, right eye: Secondary | ICD-10-CM | POA: Diagnosis not present

## 2022-10-16 ENCOUNTER — Encounter (INDEPENDENT_AMBULATORY_CARE_PROVIDER_SITE_OTHER): Payer: Medicare Other | Admitting: Ophthalmology

## 2022-10-17 DIAGNOSIS — N952 Postmenopausal atrophic vaginitis: Secondary | ICD-10-CM | POA: Diagnosis not present

## 2022-10-17 DIAGNOSIS — N3289 Other specified disorders of bladder: Secondary | ICD-10-CM | POA: Diagnosis not present

## 2022-10-17 DIAGNOSIS — Z79899 Other long term (current) drug therapy: Secondary | ICD-10-CM | POA: Diagnosis not present

## 2022-11-07 DIAGNOSIS — N390 Urinary tract infection, site not specified: Secondary | ICD-10-CM | POA: Diagnosis not present

## 2022-11-25 ENCOUNTER — Other Ambulatory Visit: Payer: Self-pay | Admitting: Family Medicine

## 2022-11-25 DIAGNOSIS — F419 Anxiety disorder, unspecified: Secondary | ICD-10-CM

## 2022-12-02 NOTE — Progress Notes (Deleted)
NEUROLOGY CONSULTATION NOTE  Dana [name] MRN: [id number] DOB: January 10, 1946  Referring provider: Blane Ohara, MD Primary care provider: Blane Ohara, MD  Reason for consult:  headaches  Assessment/Plan:   ***   Subjective:  Dana [name] is a 77 year old ***-handed female with HTN, hypothyroidism, mitral valve regurgitation, HLD, anxiety, chronic low back pain and history of ovarian cancer who presents for headaches.  History supplemented by referring provider's note.  Onset:  *** Location:  *** Quality:  *** Intensity:  ***.  *** denies new headache, thunderclap headache or severe headache that wakes *** from sleep. Aura:  *** Prodrome:  *** Postdrome:  *** Associated symptoms:  ***.  *** denies associated unilateral numbness or weakness. Duration:  *** Frequency:  *** Frequency of abortive medication: *** Triggers:  *** Relieving factors:  *** Activity:  ***  MRI of brain with and without contrast on 03/22/2021 personally reviewed was unremarkable except for few nonspecific T2 hyperintense punctate foci within the cerebral white matter (not unusual for age) and single chronic microhemorrhage in the right centrum semiovale.  Past NSAIDS/analgesics:  *** Past abortive triptans:  *** Past abortive ergotamine:  none Past muscle relaxants:  none Past anti-emetic:  none Past antihypertensive medications:  losartan Past antidepressant medications:  *** Past anticonvulsant medications:  *** Past anti-CGRP:  none Other past therapies:  ***  Current NSAIDS/analgesics:  meloxicam 7.5mg  BID PRN Current triptans:  none Current ergotamine:  none Current anti-emetic:  none Current muscle relaxants:  none Current Antihypertensive medications:  nebivolol Current Antidepressant medications:  none Current Anticonvulsant medications:  none Current anti-CGRP:  none Current Vitamins/Herbal/Supplements:  none Current Antihistamines/Decongestants:  Zyrtec Other therapy:   *** Other medications:  levothyroxine, alprazolam 0.25mg  at bedtime PRN    Caffeine:  *** Alcohol:  *** Smoker:  *** Diet:  *** Exercise:  *** Depression:  ***; Anxiety:  *** Other pain:  *** Sleep hygiene:  *** Family history of headache:  ***      PAST MEDICAL HISTORY: Past Medical History:  Diagnosis Date   Anxiety disorder 07/19/2019   Chronic back pain 03/10/2018   Depression    Dyslipidemia 07/28/2019   Dyspnea on exertion 03/19/2019   Essential hypertension 03/19/2019   History of ovarian cancer    Hyperlipidemia 04/2015   Hypertension 02/2019   Hypothyroidism 03/10/2018   Impingement syndrome of right shoulder 05/12/2014   Insomnia due to other mental disorder 07/19/2019   Lumbar back pain 07/20/2019   Mixed hyperlipidemia 03/19/2019   Right shoulder pain 05/12/2014   Sciatic nerve pain, left 07/19/2019   Sciatic nerve pain, right 07/19/2019    PAST SURGICAL HISTORY: Past Surgical History:  Procedure Laterality Date   ABDOMINAL HYSTERECTOMY  1986   Total, also had chemo   APPENDECTOMY  1986   LAPAROTOMY  1987   To rule out recurrent ovarian cancer   TONSILLECTOMY      MEDICATIONS: Current Outpatient Medications on File Prior to Visit  Medication Sig Dispense Refill   ALPRAZolam (XANAX) 0.25 MG tablet TAKE 1 TABLET BY MOUTH AT BEDTIME AS NEEDED FOR ANXIETY. 30 tablet 5   cetirizine (ZYRTEC) 10 MG tablet Take 10 mg by mouth daily.     ezetimibe (ZETIA) 10 MG tablet Take 1 tablet (10 mg total) by mouth daily. 90 tablet 3   hydrochlorothiazide (HYDRODIURIL) 25 MG tablet TAKE 1 TABLET BY MOUTH DAILY. 90 tablet 0   levothyroxine (SYNTHROID) 88 MCG tablet TAKE 1 TABLET BY MOUTH ONCE DAILY  90 tablet 2   meloxicam (MOBIC) 7.5 MG tablet Take 1 tablet (7.5 mg total) by mouth 2 (two) times daily as needed for pain. 60 tablet 1   nebivolol (BYSTOLIC) 5 MG tablet Take 1 tablet (5 mg total) by mouth daily. 30 tablet 11   PREMARIN 0.625 MG tablet Take 1 tablet (0.625 mg total)  by mouth daily. 90 tablet 3   rosuvastatin (CRESTOR) 5 MG tablet Take 1 tablet (5 mg total) by mouth 3 (three) times a week. 36 tablet 3   No current facility-administered medications on file prior to visit.    ALLERGIES: Allergies  Allergen Reactions   Lipitor [Atorvastatin]     myalgia   Penicillamine     Other reaction(s): Not available   Topamax [Topiramate] Other (See Comments)    BP decreased   Penicillins Rash    Other reaction(s): RASH    Pravastatin Other (See Comments)    Myalgia    FAMILY HISTORY: Family History  Problem Relation Age of Onset   Congestive Heart Failure Mother    Osteoarthritis Mother     Objective:  *** General: No acute distress.  Patient appears well-groomed.   Head:  Normocephalic/atraumatic Eyes:  fundi examined but not visualized Neck: supple, no paraspinal tenderness, full range of motion Back: No paraspinal tenderness Heart: regular rate and rhythm Lungs: Clear to auscultation bilaterally. Vascular: No carotid bruits. Neurological Exam: Mental status: alert and oriented to person, place, and time, speech fluent and not dysarthric, language intact. Cranial nerves: CN I: not tested CN II: pupils equal, round and reactive to light, visual fields intact CN III, IV, VI:  full range of motion, no nystagmus, no ptosis CN V: facial sensation intact. CN VII: upper and lower face symmetric CN VIII: hearing intact CN IX, X: gag intact, uvula midline CN XI: sternocleidomastoid and trapezius muscles intact CN XII: tongue midline Bulk & Tone: normal, no fasciculations. Motor:  muscle strength 5/5 throughout Sensation:  Pinprick, temperature and vibratory sensation intact. Deep Tendon Reflexes:  2+ throughout,  toes downgoing.   Finger to nose testing:  Without dysmetria.   Heel to shin:  Without dysmetria.   Gait:  Normal station and stride.  Romberg negative.    Thank you for allowing me to take part in the care of this  patient.  Shon Millet, DO  CC: ***

## 2022-12-03 ENCOUNTER — Ambulatory Visit: Payer: Medicare Other | Admitting: Neurology

## 2022-12-03 DIAGNOSIS — N390 Urinary tract infection, site not specified: Secondary | ICD-10-CM | POA: Diagnosis not present

## 2022-12-09 ENCOUNTER — Ambulatory Visit (INDEPENDENT_AMBULATORY_CARE_PROVIDER_SITE_OTHER): Payer: Medicare Other | Admitting: Family Medicine

## 2022-12-09 ENCOUNTER — Encounter: Payer: Self-pay | Admitting: Family Medicine

## 2022-12-09 VITALS — BP 120/60 | HR 78 | Temp 97.6°F | Resp 14 | Ht 63.0 in | Wt 141.0 lb

## 2022-12-09 DIAGNOSIS — R051 Acute cough: Secondary | ICD-10-CM | POA: Insufficient documentation

## 2022-12-09 DIAGNOSIS — J0110 Acute frontal sinusitis, unspecified: Secondary | ICD-10-CM | POA: Diagnosis not present

## 2022-12-09 LAB — POC COVID19 BINAXNOW: SARS Coronavirus 2 Ag: NEGATIVE

## 2022-12-09 MED ORDER — DOXYCYCLINE HYCLATE 100 MG PO TABS
100.0000 mg | ORAL_TABLET | Freq: Two times a day (BID) | ORAL | 0 refills | Status: AC
Start: 2022-12-09 — End: 2022-12-16

## 2022-12-09 MED ORDER — FLUCONAZOLE 150 MG PO TABS
150.0000 mg | ORAL_TABLET | Freq: Once | ORAL | 0 refills | Status: AC
Start: 2022-12-09 — End: 2022-12-09

## 2022-12-09 NOTE — Progress Notes (Signed)
Subjective:  Patient ID: Dana [name], female    DOB: 04-15-1945  Age: 77 y.o. MRN: [id number]  Chief Complaint  Patient presents with   Cough   Headache    HPI   Patient is here for dry cough, left side of the headache behind the left eye, and sinus congestion since 8 days ago. She has been taking migraine relief (acetaminophen and aspirin), but it is not helping. Patient had started to take zyrtec as recommended for watery eyes and discontinued due because it wasn't helping. Patient reports getting a stabbing pain when she bends over. Patient is just finishing up her antibiotic from her bladder infection. She stated that she hasn't been able to sleep at night due the coughing keeping her up at night. Denies chest pain, sore throat, ear pain, or shortness of breath.      09/24/2022    8:14 AM 09/24/2022    8:12 AM 09/19/2021    1:26 PM 11/10/2020    8:41 AM 09/05/2020    9:11 AM  Depression screen PHQ 2/9  Decreased Interest 0 0 0 0 0  Down, Depressed, Hopeless 0 0 0 0 0  PHQ - 2 Score 0 0 0 0 0  Altered sleeping 1      Tired, decreased energy 0      Change in appetite 0      Feeling bad or failure about yourself  0      Trouble concentrating 1      Moving slowly or fidgety/restless 0      Suicidal thoughts 0      PHQ-9 Score 2      Difficult doing work/chores Not difficult at all            09/24/2022    8:14 AM  Fall Risk   Falls in the past year? 0  Number falls in past yr: 0  Injury with Fall? 0  Risk for fall due to : No Fall Risks  Follow up Falls evaluation completed;Falls prevention discussed    Patient Care Team: Blane Ohara, MD as PCP - General (Family Medicine) Georgeanna Lea, MD as Consulting Physician (Cardiology) Corrin Parker. (Inactive) as Referring Physician (Obstetrics and Gynecology) Mateo Flow, MD as Consulting Physician (Ophthalmology)   Review of Systems  Constitutional:  Negative for chills, fatigue and fever.  HENT:   Positive for congestion, postnasal drip, rhinorrhea and sneezing. Negative for ear pain, sinus pressure, sinus pain and sore throat.   Respiratory:  Positive for cough and wheezing. Negative for shortness of breath.   Cardiovascular:  Negative for chest pain and palpitations.  Gastrointestinal:  Positive for constipation. Negative for abdominal pain, diarrhea, nausea and vomiting.  Endocrine: Negative for polydipsia, polyphagia and polyuria.  Genitourinary:  Negative for difficulty urinating and dysuria.  Musculoskeletal:  Negative for arthralgias, back pain and myalgias.  Skin:  Negative for rash.  Neurological:  Positive for headaches.  Psychiatric/Behavioral:  Negative for dysphoric mood. The patient is not nervous/anxious.     Current Outpatient Medications on File Prior to Visit  Medication Sig Dispense Refill   ALPRAZolam (XANAX) 0.25 MG tablet TAKE 1 TABLET BY MOUTH AT BEDTIME AS NEEDED FOR ANXIETY. 30 tablet 5   ezetimibe (ZETIA) 10 MG tablet Take 1 tablet (10 mg total) by mouth daily. 90 tablet 3   hydrochlorothiazide (HYDRODIURIL) 25 MG tablet TAKE 1 TABLET BY MOUTH DAILY. 90 tablet 0   levothyroxine (SYNTHROID) 88 MCG tablet TAKE 1 TABLET  BY MOUTH ONCE DAILY 90 tablet 2   meloxicam (MOBIC) 7.5 MG tablet Take 1 tablet (7.5 mg total) by mouth 2 (two) times daily as needed for pain. 60 tablet 1   nebivolol (BYSTOLIC) 5 MG tablet Take 1 tablet (5 mg total) by mouth daily. 30 tablet 11   nitrofurantoin, macrocrystal-monohydrate, (MACROBID) 100 MG capsule Take 100 mg by mouth 2 (two) times daily.     PREMARIN 0.625 MG tablet Take 1 tablet (0.625 mg total) by mouth daily. 90 tablet 3   rosuvastatin (CRESTOR) 5 MG tablet Take 1 tablet (5 mg total) by mouth 3 (three) times a week. 36 tablet 3   cetirizine (ZYRTEC) 10 MG tablet Take 10 mg by mouth daily. (Patient not taking: Reported on 12/09/2022)     No current facility-administered medications on file prior to visit.   Past Medical  History:  Diagnosis Date   Anxiety disorder 07/19/2019   Chronic back pain 03/10/2018   Depression    Dyslipidemia 07/28/2019   Dyspnea on exertion 03/19/2019   Essential hypertension 03/19/2019   History of ovarian cancer    Hyperlipidemia 04/2015   Hypertension 02/2019   Hypothyroidism 03/10/2018   Impingement syndrome of right shoulder 05/12/2014   Insomnia due to other mental disorder 07/19/2019   Lumbar back pain 07/20/2019   Mixed hyperlipidemia 03/19/2019   Right shoulder pain 05/12/2014   Sciatic nerve pain, left 07/19/2019   Sciatic nerve pain, right 07/19/2019   Past Surgical History:  Procedure Laterality Date   ABDOMINAL HYSTERECTOMY  1986   Total, also had chemo   APPENDECTOMY  1986   LAPAROTOMY  1987   To rule out recurrent ovarian cancer   TONSILLECTOMY      Family History  Problem Relation Age of Onset   Congestive Heart Failure Mother    Osteoarthritis Mother    Social History   Socioeconomic History   Marital status: Married    Spouse name: Roe Coombs   Number of children: Not on file   Years of education: Not on file   Highest education level: Not on file  Occupational History   Not on file  Tobacco Use   Smoking status: Never   Smokeless tobacco: Never  Vaping Use   Vaping status: Never Used  Substance and Sexual Activity   Alcohol use: Yes    Alcohol/week: 1.0 standard drink of alcohol    Types: 1 Glasses of wine per week    Comment: occasionally   Drug use: Not Currently    Types: Solvent inhalants   Sexual activity: Yes    Partners: Male  Other Topics Concern   Not on file  Social History Narrative   Not on file   Social Determinants of Health   Financial Resource Strain: Low Risk  (09/24/2022)   Overall Financial Resource Strain (CARDIA)    Difficulty of Paying Living Expenses: Not hard at all  Food Insecurity: No Food Insecurity (09/24/2022)   Hunger Vital Sign    Worried About Running Out of Food in the Last Year: Never true    Ran Out of Food  in the Last Year: Never true  Transportation Needs: No Transportation Needs (09/24/2022)   PRAPARE - Administrator, Civil Service (Medical): No    Lack of Transportation (Non-Medical): No  Physical Activity: Insufficiently Active (09/24/2022)   Exercise Vital Sign    Days of Exercise per Week: 3 days    Minutes of Exercise per Session: 40 min  Stress:  No Stress Concern Present (09/24/2022)   [employer] of Occupational Health - Occupational Stress Questionnaire    Feeling of Stress : Only a little  Social Connections: Socially Integrated (09/24/2022)   Social Connection and Isolation Panel [NHANES]    Frequency of Communication with Friends and Family: More than three times a week    Frequency of Social Gatherings with Friends and Family: More than three times a week    Attends Religious Services: More than 4 times per year    Active Member of [employer] or Organizations: Yes    Attends Engineer, structural: More than 4 times per year    Marital Status: Married    Objective:  BP 120/60   Pulse 78   Temp 97.6 F (36.4 C)   Resp 14   Ht 5\' 3"  (1.6 m)   Wt 141 lb (64 kg)   LMP  (LMP Unknown)   SpO2 96%   BMI 24.98 kg/m      12/09/2022    1:36 PM 09/24/2022    8:25 AM 09/09/2022   10:18 AM  BP/Weight  Systolic BP 120 110 116  Diastolic BP 60 70 78  Wt. (Lbs) 141 142 141.8  BMI 24.98 kg/m2 25.15 kg/m2 25.12 kg/m2    Physical Exam Vitals reviewed.  Constitutional:      General: She is not in acute distress.    Appearance: She is ill-appearing.  HENT:     Head: Normocephalic.     Right Ear: There is impacted cerumen.     Left Ear: Tympanic membrane normal.     Nose: Nasal tenderness (left nostril) present.     Right Turbinates: Swollen.     Left Turbinates: Swollen.     Right Sinus: Frontal sinus tenderness (when she bends over) present.     Left Sinus: Frontal sinus tenderness present.     Mouth/Throat:     Mouth: Mucous membranes are moist.   Cardiovascular:     Rate and Rhythm: Normal rate and regular rhythm.     Heart sounds: Normal heart sounds.  Pulmonary:     Effort: Pulmonary effort is normal.     Breath sounds: Normal breath sounds. No wheezing.  Neurological:     Mental Status: She is alert.      Lab Results  Component Value Date   WBC 8.0 09/24/2022   HGB 13.7 09/24/2022   HCT 41.7 09/24/2022   PLT 339 09/24/2022   GLUCOSE 108 (H) 09/24/2022   CHOL 189 09/09/2022   TRIG 94 09/09/2022   HDL 88 09/09/2022   LDLCALC 84 09/09/2022   ALT 12 09/24/2022   AST 14 09/24/2022   NA 142 09/24/2022   K 4.5 09/24/2022   CL 101 09/24/2022   CREATININE 0.77 09/24/2022   BUN 17 09/24/2022   CO2 25 09/24/2022   TSH 1.900 03/18/2022   HGBA1C 6.0 (H) 09/24/2022      Assessment & Plan:    Acute non-recurrent frontal sinusitis Assessment & Plan: Acute  Increase fluid intake Take allegra 60 mg 12 hr tablets for allergy symptoms Flonase ONCE ADAY for nasal congestion. Doxycycline 100 mg TWICE A DAY for 7 days Diflucan 150 mg once for vaginal itching Fu if symptoms get worse or fail to improve  Orders: -     Doxycycline Hyclate; Take 1 tablet (100 mg total) by mouth 2 (two) times daily for 7 days.  Dispense: 14 tablet; Refill: 0 -     Fluconazole; Take 1  tablet (150 mg total) by mouth once for 1 dose.  Dispense: 1 tablet; Refill: 0  Acute cough Assessment & Plan: Covid - negative Try lemon and honey or cough drops as needed for cough  Orders: -     POC COVID-19 BinaxNow     Meds ordered this encounter  Medications   doxycycline (VIBRA-TABS) 100 MG tablet    Sig: Take 1 tablet (100 mg total) by mouth 2 (two) times daily for 7 days.    Dispense:  14 tablet    Refill:  0   fluconazole (DIFLUCAN) 150 MG tablet    Sig: Take 1 tablet (150 mg total) by mouth once for 1 dose.    Dispense:  1 tablet    Refill:  0    Orders Placed This Encounter  Procedures   POC COVID-19     Follow-up: Return if  symptoms worsen or fail to improve.   Clayborn Bigness I Leal-Borjas,acting as a scribe for Renne Crigler, FNP.,have documented all relevant documentation on the behalf of Renne Crigler, FNP,as directed by  Renne Crigler, FNP while in the presence of Renne Crigler, FNP.   An After Visit Summary was printed and given to the patient.  Total time spent on today's visit was greater than 30 minutes, including both face-to-face time and nonface-to-face time personally spent on review of chart (labs and imaging), discussing labs and goals, discussing further work-up, treatment options, referrals to specialist if needed, reviewing outside records if pertinent, answering patient's questions, and coordinating care.   Lajuana Matte, FNP [facility] [phone]

## 2022-12-09 NOTE — Assessment & Plan Note (Addendum)
Acute  Increase fluid intake Take allegra 60 mg 12 hr tablets for allergy symptoms Flonase ONCE ADAY for nasal congestion. Doxycycline 100 mg TWICE A DAY for 7 days Diflucan 150 mg once for vaginal itching Fu if symptoms get worse or fail to improve

## 2022-12-09 NOTE — Assessment & Plan Note (Addendum)
Covid - negative Try lemon and honey or cough drops as needed for cough

## 2022-12-17 DIAGNOSIS — N952 Postmenopausal atrophic vaginitis: Secondary | ICD-10-CM | POA: Diagnosis not present

## 2022-12-17 DIAGNOSIS — N390 Urinary tract infection, site not specified: Secondary | ICD-10-CM | POA: Diagnosis not present

## 2022-12-30 NOTE — Progress Notes (Unsigned)
NEUROLOGY CONSULTATION NOTE  Dana [name] MRN: [id number] DOB: June 17, 1945  Referring provider: Blane Ohara, MD Primary care provider: Blane Ohara, MD  Reason for consult:  headaches  Assessment/Plan:   Primary cough headache  Check CTA of head to evaluate for any potential aneurysm/vascular etiology Start acetazolamide 250mg  twice daily.  We can increase dose to 500mg  twice daily in 4 weeks if needed.  Discussed side effects such as paresthesias May use Excedrin Migraine generic as needed.  Limit use of pain relievers to no more than 2 days out of week to prevent risk of rebound or medication-overuse headache. Follow up 5 months.   Subjective:  Dana [name] is a 77 year old right-handed female with HTN, hypothyroidism, mitral valve regurgitation, HLD, anxiety, chronic low back pain and history of ovarian cancer who presents for headaches.  History supplemented by referring provider's note.  In 2022, she developed a new headache.  Whenever she would cough, sneeze or bend over, she would experience a severe sharp pain in the left frontal/periorbital region radiating up to the left side of her scalp.  It would last about 20 minutes and subside.  Some associated photophobia but no neck pain, nausea, vomiting, phonophobia, dizziness or visual disturbance. She developed a respiratory infection at that time, so the headaches were frequent because she was frequently coughing.  MRI of brain with and without contrast on 03/22/2021 personally reviewed was unremarkable except for few nonspecific T2 hyperintense punctate foci within the cerebral white matter (not unusual for age) and single chronic microhemorrhage in the right centrum semiovale.  She had a frequent cough for about a couple of months and then it subsided.  Whenever she would cough since then, it would cause the headache but it only occurred once in awhile.  She recently developed a new nagging cough and is now experiencing the same  headaches again.  She has been taking generic Excedrin Migraine as needed, which has helped.  She has history of ocular migraines without headache but no prior history of headaches.  CBC and CMP from July reviewed.  PAST MEDICAL HISTORY: Past Medical History:  Diagnosis Date   Anxiety disorder 76/24/2021   Chronic back pain 03/10/2018   Depression    Dyslipidemia 07/28/2019   Dyspnea on exertion 03/19/2019   Essential hypertension 03/19/2019   History of ovarian cancer    Hyperlipidemia 04/2015   Hypertension 02/2019   Hypothyroidism 03/10/2018   Impingement syndrome of right shoulder 05/12/2014   Insomnia due to other mental disorder 07/19/2019   Lumbar back pain 07/20/2019   Mixed hyperlipidemia 03/19/2019   Right shoulder pain 05/12/2014   Sciatic nerve pain, left 07/19/2019   Sciatic nerve pain, right 07/19/2019    PAST SURGICAL HISTORY: Past Surgical History:  Procedure Laterality Date   ABDOMINAL HYSTERECTOMY  1986   Total, also had chemo   APPENDECTOMY  1986   LAPAROTOMY  1987   To rule out recurrent ovarian cancer   TONSILLECTOMY      MEDICATIONS: Current Outpatient Medications on File Prior to Visit  Medication Sig Dispense Refill   ALPRAZolam (XANAX) 0.25 MG tablet TAKE 1 TABLET BY MOUTH AT BEDTIME AS NEEDED FOR ANXIETY. 30 tablet 5   cetirizine (ZYRTEC) 10 MG tablet Take 10 mg by mouth daily. (Patient not taking: Reported on 12/09/2022)     ezetimibe (ZETIA) 10 MG tablet Take 1 tablet (10 mg total) by mouth daily. 90 tablet 3   hydrochlorothiazide (HYDRODIURIL) 25 MG tablet TAKE 1  TABLET BY MOUTH DAILY. 90 tablet 0   levothyroxine (SYNTHROID) 88 MCG tablet TAKE 1 TABLET BY MOUTH ONCE DAILY 90 tablet 2   meloxicam (MOBIC) 7.5 MG tablet Take 1 tablet (7.5 mg total) by mouth 2 (two) times daily as needed for pain. 60 tablet 1   nebivolol (BYSTOLIC) 5 MG tablet Take 1 tablet (5 mg total) by mouth daily. 30 tablet 11   nitrofurantoin, macrocrystal-monohydrate, (MACROBID) 100  MG capsule Take 100 mg by mouth 2 (two) times daily.     PREMARIN 0.625 MG tablet Take 1 tablet (0.625 mg total) by mouth daily. 90 tablet 3   rosuvastatin (CRESTOR) 5 MG tablet Take 1 tablet (5 mg total) by mouth 3 (three) times a week. 36 tablet 3   No current facility-administered medications on file prior to visit.    ALLERGIES: Allergies  Allergen Reactions   Lipitor [Atorvastatin]     myalgia   Penicillamine     Other reaction(s): Not available   Topamax [Topiramate] Other (See Comments)    BP decreased   Penicillins Rash    Other reaction(s): RASH    Pravastatin Other (See Comments)    Myalgia    FAMILY HISTORY: Family History  Problem Relation Age of Onset   Congestive Heart Failure Mother    Osteoarthritis Mother     Objective:  Blood pressure 116/70, pulse 67, height 5\' 4"  (1.626 m), weight 140 lb 3.2 oz (63.6 kg), SpO2 96%. General: No acute distress.  Patient appears well-groomed.   Head:  Normocephalic/atraumatic Eyes:  fundi examined but not visualized Neck: supple, no paraspinal tenderness, full range of motion Heart: regular rate and rhythm Neurological Exam: Mental status: alert and oriented to person, place, and time, speech fluent and not dysarthric, language intact. Cranial nerves: CN I: not tested CN II: pupils equal, round and reactive to light, visual fields intact CN III, IV, VI:  full range of motion, no nystagmus, no ptosis CN V: facial sensation intact. CN VII: upper and lower face symmetric CN VIII: hearing intact CN IX, X: gag intact, uvula midline CN XI: sternocleidomastoid and trapezius muscles intact CN XII: tongue midline Bulk & Tone: normal, no fasciculations. Motor:  muscle strength 5/5 throughout Sensation:  Pinprick and vibratory sensation intact. Deep Tendon Reflexes:  2+ throughout,  toes downgoing.   Finger to nose testing:  Without dysmetria.   Gait:  Normal station and stride.  Romberg negative.    Thank you for  allowing me to take part in the care of this patient.  Shon Millet, DO  CC: Blane Ohara, MD

## 2022-12-31 ENCOUNTER — Ambulatory Visit: Payer: Medicare Other | Admitting: Neurology

## 2022-12-31 ENCOUNTER — Encounter: Payer: Self-pay | Admitting: Neurology

## 2022-12-31 VITALS — BP 116/70 | HR 67 | Ht 64.0 in | Wt 140.2 lb

## 2022-12-31 DIAGNOSIS — G4483 Primary cough headache: Secondary | ICD-10-CM

## 2022-12-31 MED ORDER — ACETAZOLAMIDE 250 MG PO TABS: 250.0000 mg | ORAL_TABLET | Freq: Two times a day (BID) | ORAL | 5 refills | Status: DC

## 2022-12-31 NOTE — Patient Instructions (Signed)
Check CTA of head Start acetazolamide 250mg  twice daily.  It may cause numbness and tingling sensation.  If no improvement in 4 weeks, contact me and we can increase dose May use the Migraine Relief medication as needed.  Limit use of pain relievers to no more than 2 days out of week to prevent risk of rebound or medication-overuse headache. Follow up 5 months.

## 2022-12-31 NOTE — Progress Notes (Signed)
[facility] - Clinic Note  01/14/2023   CHIEF COMPLAINT Patient presents for Retina Follow Up  HISTORY OF PRESENT ILLNESS: Dana [name] is a 77 y.o. female who presents to the clinic today for:  HPI     Retina Follow Up   Patient presents with  Retinal Break/Detachment.  In right eye.  Severity is moderate.  Duration of 3 months.  Since onset it is stable.  I, the attending physician,  performed the HPI with the patient and updated documentation appropriately.        Comments   Pt here for 3 mo ret f/u ret hole OD. Pt states VA is the same, no changes, no floaters or FOL. Pt reports using allergy eye drops occasionally PRN.       Last edited by Rennis Chris, MD on 01/14/2023 10:32 PM.    Patient states she is not seeing flashes or floaters.   Referring physician: Frazier, [place], OD [address] [place],  [place] [address]  HISTORICAL INFORMATION:  Selected notes from the MEDICAL RECORD NUMBER Referred by Dr. Bascom Levels for operculated retinal hole OD LEE:  Ocular Hx- PMH-   CURRENT MEDICATIONS: No current outpatient medications on file. (Ophthalmic Drugs)   No current facility-administered medications for this visit. (Ophthalmic Drugs)   Current Outpatient Medications (Other)  Medication Sig   acetaZOLAMIDE (DIAMOX) 250 MG tablet Take 1 tablet (250 mg total) by mouth 2 (two) times daily.   ALPRAZolam (XANAX) 0.25 MG tablet TAKE 1 TABLET BY MOUTH AT BEDTIME AS NEEDED FOR ANXIETY.   ezetimibe (ZETIA) 10 MG tablet Take 1 tablet (10 mg total) by mouth daily.   hydrochlorothiazide (HYDRODIURIL) 25 MG tablet TAKE 1 TABLET BY MOUTH DAILY.   levothyroxine (SYNTHROID) 88 MCG tablet TAKE 1 TABLET BY MOUTH ONCE DAILY   meloxicam (MOBIC) 7.5 MG tablet Take 1 tablet (7.5 mg total) by mouth 2 (two) times daily as needed for pain.   nebivolol (BYSTOLIC) 5 MG tablet Take 1 tablet (5 mg total) by mouth daily.   nitrofurantoin, macrocrystal-monohydrate,  (MACROBID) 100 MG capsule Take 100 mg by mouth 2 (two) times daily.   PREMARIN 0.625 MG tablet Take 1 tablet (0.625 mg total) by mouth daily.   rosuvastatin (CRESTOR) 5 MG tablet Take 1 tablet (5 mg total) by mouth 3 (three) times a week.   No current facility-administered medications for this visit. (Other)   REVIEW OF SYSTEMS: ROS   Positive for: Cardiovascular, Eyes, Psychiatric Negative for: Constitutional, Gastrointestinal, Neurological, Skin, Genitourinary, Musculoskeletal, HENT, Endocrine, Respiratory, Allergic/Imm, Heme/Lymph Last edited by Thompson Grayer, COT on 01/14/2023 12:49 PM.       ALLERGIES Allergies  Allergen Reactions   Lipitor [Atorvastatin]     myalgia   Penicillamine     Other reaction(s): Not available   Topamax [Topiramate] Other (See Comments)    BP decreased   Penicillins Rash    Other reaction(s): RASH    Pravastatin Other (See Comments)    Myalgia   PAST MEDICAL HISTORY Past Medical History:  Diagnosis Date   Anxiety disorder 07/19/2019   Chronic back pain 03/10/2018   Depression    Dyslipidemia 07/28/2019   Dyspnea on exertion 03/19/2019   Essential hypertension 03/19/2019   History of ovarian cancer    Hyperlipidemia 04/2015   Hypertension 02/2019   Hypothyroidism 03/10/2018   Impingement syndrome of right shoulder 05/12/2014   Insomnia due to other mental disorder 07/19/2019   Lumbar back pain 07/20/2019  Mixed hyperlipidemia 03/19/2019   Right shoulder pain 05/12/2014   Sciatic nerve pain, left 07/19/2019   Sciatic nerve pain, right 07/19/2019   Past Surgical History:  Procedure Laterality Date   ABDOMINAL HYSTERECTOMY  1986   Total, also had chemo   APPENDECTOMY  1986   LAPAROTOMY  1987   To rule out recurrent ovarian cancer   TONSILLECTOMY     FAMILY HISTORY Family History  Problem Relation Age of Onset   Congestive Heart Failure Mother    Osteoarthritis Mother    Neuropathy Father    SOCIAL HISTORY Social History    Tobacco Use   Smoking status: Never   Smokeless tobacco: Never  Vaping Use   Vaping status: Never Used  Substance Use Topics   Alcohol use: Yes    Alcohol/week: 1.0 standard drink of alcohol    Types: 1 Glasses of wine per week    Comment: occasionally   Drug use: Not Currently    Types: Solvent inhalants       OPHTHALMIC EXAM:  Base Eye Exam     Visual Acuity (Snellen - Linear)       Right Left   Dist cc 20/15 20/20         Tonometry (Tonopen, 12:53 PM)       Right Left   Pressure 18 19         Pupils       Pupils Dark Light Shape React APD   Right PERRL 3 2 Round Brisk None   Left PERRL 3 2 Round Brisk None         Visual Fields (Counting fingers)       Left Right    Full Full         Extraocular Movement       Right Left    Full, Ortho Full, Ortho         Neuro/Psych     Oriented x3: Yes   Mood/Affect: Normal         Dilation     Both eyes: 1.0% Mydriacyl, 2.5% Phenylephrine @ 12:53 PM           Slit Lamp and Fundus Exam     External Exam       Right Left   External Normal Normal         Slit Lamp Exam       Right Left   Lids/Lashes Dermatochalasis - upper lid, Meibomian gland dysfunction Dermatochalasis - upper lid, Meibomian gland dysfunction   Conjunctiva/Sclera White and quiet White and quiet   Cornea Arcus, 2+ inferior Punctate epithelial erosions, Well healed temporal cataract wound Arcus, trace  Punctate epithelial erosions, Well healed temporal cataract wound   Anterior Chamber Deep and clear Deep and clear   Iris Round and dilated Round and dilated   Lens 3 piece IOL with open PC 3 piece IOL with open PC   Anterior Vitreous mild syneresis, no pigment, Posterior vitreous detachment Vitreous syneresis, no pigment, Posterior vitreous detachment, Vitreous condensations         Fundus Exam       Right Left   Disc mild PPA, Pink and sharp mild PPP, Pink and sharp   C/D Ratio 0.6 0.6   Macula Flat, Good  foveal reflex, RPE mottling, No heme or edema, mild ERM Flat, Blunted foveal reflex, Retinal pigment epithelial mottling, No heme or edema   Vessels attenuated, mild tortuosity Vascular attenuation, Tortuous   Periphery Attached, punctate pigmented  operculated hole at 0130 -- good laser surrounding, no SRF; no heme, no new RT/RD attached; no heme           Refraction     Wearing Rx       Sphere Cylinder Axis Add   Right -0.75 +0.75 006 +2.50   Left -1.50 +0.75 155 +2.50           IMAGING AND PROCEDURES  Imaging and Procedures for 01/14/2023  OCT, Retina - OU - Both Eyes       Right Eye Quality was good. Central Foveal Thickness: 277. Progression has been stable. Findings include normal foveal contour, no IRF, no SRF (Mild ERM).   Left Eye Quality was good. Central Foveal Thickness: 261. Progression has been stable. Findings include normal foveal contour, no IRF, no SRF.   Notes *Images captured and stored on drive  Diagnosis / Impression:  NFP; no IRF/SRF OU OD: Mild ERM  Clinical management:  See below  Abbreviations: NFP - Normal foveal profile. CME - cystoid macular edema. PED - pigment epithelial detachment. IRF - intraretinal fluid. SRF - subretinal fluid. EZ - ellipsoid zone. ERM - epiretinal membrane. ORA - outer retinal atrophy. ORT - outer retinal tubulation. SRHM - subretinal hyper-reflective material. IRHM - intraretinal hyper-reflective material           ASSESSMENT/PLAN:   ICD-10-CM   1. Retinal hole of right eye  H33.321 OCT, Retina - OU - Both Eyes    2. Essential hypertension  I10     3. Hypertensive retinopathy of both eyes  H35.033     4. Pseudophakia of both eyes  Z96.1     5. Bilateral dry eyes  H04.123      Retinal hole, OD - pigmented, operculated retinal hole at 0130 -- no SRF; asymptomatic - found on routine exam - s/p laser retinopexy OD (07.31.24) -- good laser changes in place surrounding hole - no new RT/RD on today's  exam - pt is cleared from a retina standpoint for release to Dr. Bascom Levels and resumption of primary eye care  - f/u here PRN  2,3. Hypertensive retinopathy OU - discussed importance of tight BP control - OCT shows WNL OU - monitor   4. Pseudophakia OU  - s/p CE/IOL OU w/ Dr. Elmer Picker  - IOL in good position, doing well  - monitor   5. Dry eyes OU - recommend artificial tears and lubricating ointment as needed   Ophthalmic Meds Ordered this visit:  No orders of the defined types were placed in this encounter.    Return if symptoms worsen or fail to improve.  There are no Patient Instructions on file for this visit.  Explained the diagnoses, plan, and follow up with the patient and they expressed understanding.  Patient expressed understanding of the importance of proper follow up care.   This document serves as a record of services personally performed by Karie Chimera, MD, PhD. It was created on their behalf by Laurey Morale, COT an ophthalmic technician. The creation of this record is the provider's dictation and/or activities during the visit.    Electronically signed by:  Charlette Caffey, COT  01/14/23 10:36 PM  Karie Chimera, M.D., Ph.D. Diseases & Surgery of the Retina and Vitreous [facility] 01/14/2023  I have reviewed the above documentation for accuracy and completeness, and I agree with the above. Karie Chimera, M.D., Ph.D. 01/14/23 10:37 PM   Abbreviations: M myopia (nearsighted);  A astigmatism; H hyperopia (farsighted); P presbyopia; Mrx spectacle prescription;  CTL contact lenses; OD right eye; OS left eye; OU both eyes  XT exotropia; ET esotropia; PEK punctate epithelial keratitis; PEE punctate epithelial erosions; DES dry eye syndrome; MGD meibomian gland dysfunction; ATs artificial tears; PFAT's preservative free artificial tears; NSC nuclear sclerotic cataract; PSC posterior subcapsular cataract; ERM epi-retinal membrane; PVD  posterior vitreous detachment; RD retinal detachment; DM diabetes mellitus; DR diabetic retinopathy; NPDR non-proliferative diabetic retinopathy; PDR proliferative diabetic retinopathy; CSME clinically significant macular edema; DME diabetic macular edema; dbh dot blot hemorrhages; CWS cotton wool spot; POAG primary open angle glaucoma; C/D cup-to-disc ratio; HVF humphrey visual field; GVF goldmann visual field; OCT optical coherence tomography; IOP intraocular pressure; BRVO Branch retinal vein occlusion; CRVO central retinal vein occlusion; CRAO central retinal artery occlusion; BRAO branch retinal artery occlusion; RT retinal tear; SB scleral buckle; PPV pars plana vitrectomy; VH Vitreous hemorrhage; PRP panretinal laser photocoagulation; IVK intravitreal kenalog; VMT vitreomacular traction; MH Macular hole;  NVD neovascularization of the disc; NVE neovascularization elsewhere; AREDS age related eye disease study; ARMD age related macular degeneration; POAG primary open angle glaucoma; EBMD epithelial/anterior basement membrane dystrophy; ACIOL anterior chamber intraocular lens; IOL intraocular lens; PCIOL posterior chamber intraocular lens; Phaco/IOL phacoemulsification with intraocular lens placement; PRK photorefractive keratectomy; LASIK laser assisted in situ keratomileusis; HTN hypertension; DM diabetes mellitus; COPD chronic obstructive pulmonary disease

## 2023-01-03 ENCOUNTER — Ambulatory Visit
Admission: RE | Admit: 2023-01-03 | Discharge: 2023-01-03 | Disposition: A | Discharge status: Home / Self Care | Payer: Medicare Other | Source: Ambulatory Visit | Attending: Neurology | Admitting: Neurology

## 2023-01-03 DIAGNOSIS — R519 Headache, unspecified: Secondary | ICD-10-CM | POA: Diagnosis not present

## 2023-01-03 DIAGNOSIS — I6523 Occlusion and stenosis of bilateral carotid arteries: Secondary | ICD-10-CM | POA: Diagnosis not present

## 2023-01-03 DIAGNOSIS — I6601 Occlusion and stenosis of right middle cerebral artery: Secondary | ICD-10-CM | POA: Diagnosis not present

## 2023-01-03 DIAGNOSIS — G4483 Primary cough headache: Secondary | ICD-10-CM

## 2023-01-03 MED ORDER — IOPAMIDOL (ISOVUE-370) INJECTION 76%
500.0000 mL | Freq: Once | INTRAVENOUS | Status: AC | PRN
  Administered 2023-01-03: 65 mL via INTRAVENOUS

## 2023-01-07 ENCOUNTER — Other Ambulatory Visit: Payer: Self-pay | Admitting: Family Medicine

## 2023-01-13 DIAGNOSIS — K080 Exfoliation of teeth due to systemic causes: Secondary | ICD-10-CM | POA: Diagnosis not present

## 2023-01-14 ENCOUNTER — Encounter (INDEPENDENT_AMBULATORY_CARE_PROVIDER_SITE_OTHER): Payer: Self-pay | Admitting: Ophthalmology

## 2023-01-14 ENCOUNTER — Ambulatory Visit (INDEPENDENT_AMBULATORY_CARE_PROVIDER_SITE_OTHER): Payer: Medicare Other | Admitting: Ophthalmology

## 2023-01-14 DIAGNOSIS — I10 Essential (primary) hypertension: Secondary | ICD-10-CM

## 2023-01-14 DIAGNOSIS — H33321 Round hole, right eye: Secondary | ICD-10-CM

## 2023-01-14 DIAGNOSIS — Z961 Presence of intraocular lens: Secondary | ICD-10-CM

## 2023-01-14 DIAGNOSIS — H35033 Hypertensive retinopathy, bilateral: Secondary | ICD-10-CM

## 2023-01-14 DIAGNOSIS — H04123 Dry eye syndrome of bilateral lacrimal glands: Secondary | ICD-10-CM

## 2023-01-16 ENCOUNTER — Telehealth: Payer: Self-pay | Admitting: Neurology

## 2023-01-16 NOTE — Telephone Encounter (Signed)
Patient said she called twice last week or once last week and once the week before that. She was sent to a VM and she left a message each time. She has not heard back from anyone regarding the results of the MRI. She would like a call back today

## 2023-01-16 NOTE — Telephone Encounter (Signed)
CT hasn't been read form 01/03/23. Will call to have it read.   Per patient the Acetazolamide make her feel fatigue.

## 2023-01-17 NOTE — Telephone Encounter (Signed)
Per patient has tried taken the 1/2  bid for a week now.  She would rather continue  trying the 1/2 tab BID then switch to a NSAID Or Topiramate.

## 2023-01-27 DIAGNOSIS — N390 Urinary tract infection, site not specified: Secondary | ICD-10-CM | POA: Diagnosis not present

## 2023-01-27 DIAGNOSIS — N952 Postmenopausal atrophic vaginitis: Secondary | ICD-10-CM | POA: Diagnosis not present

## 2023-01-29 ENCOUNTER — Ambulatory Visit (INDEPENDENT_AMBULATORY_CARE_PROVIDER_SITE_OTHER): Payer: Medicare Other | Admitting: Family Medicine

## 2023-01-29 VITALS — BP 132/74 | HR 56 | Temp 98.2°F | Ht 64.0 in | Wt 129.0 lb

## 2023-01-29 DIAGNOSIS — R10811 Right upper quadrant abdominal tenderness: Secondary | ICD-10-CM | POA: Insufficient documentation

## 2023-01-29 DIAGNOSIS — E440 Moderate protein-calorie malnutrition: Secondary | ICD-10-CM | POA: Diagnosis not present

## 2023-01-29 DIAGNOSIS — R1011 Right upper quadrant pain: Secondary | ICD-10-CM | POA: Diagnosis not present

## 2023-01-29 DIAGNOSIS — N3000 Acute cystitis without hematuria: Secondary | ICD-10-CM | POA: Diagnosis not present

## 2023-01-29 DIAGNOSIS — G4483 Primary cough headache: Secondary | ICD-10-CM

## 2023-01-29 HISTORY — DX: Right upper quadrant abdominal tenderness: R10.811

## 2023-01-29 LAB — CBC WITH DIFFERENTIAL/PLATELET
Basophils Absolute: 0 10*3/uL (ref 0.0–0.2)
Basos: 0 %
EOS (ABSOLUTE): 0.1 10*3/uL (ref 0.0–0.4)
Eos: 1 %
Hematocrit: 45.6 % (ref 34.0–46.6)
Hemoglobin: 15 g/dL (ref 11.1–15.9)
Immature Grans (Abs): 0 10*3/uL (ref 0.0–0.1)
Immature Granulocytes: 0 %
Lymphocytes Absolute: 1.9 10*3/uL (ref 0.7–3.1)
Lymphs: 29 %
MCH: 30.7 pg (ref 26.6–33.0)
MCHC: 32.9 g/dL (ref 31.5–35.7)
MCV: 93 fL (ref 79–97)
Monocytes Absolute: 0.5 10*3/uL (ref 0.1–0.9)
Monocytes: 8 %
Neutrophils Absolute: 4.1 10*3/uL (ref 1.4–7.0)
Neutrophils: 62 %
Platelets: 398 10*3/uL (ref 150–450)
RBC: 4.88 x10E6/uL (ref 3.77–5.28)
RDW: 12.8 % (ref 11.7–15.4)
WBC: 6.7 10*3/uL (ref 3.4–10.8)

## 2023-01-29 LAB — COMPREHENSIVE METABOLIC PANEL
ALT: 10 [IU]/L (ref 0–32)
AST: 19 [IU]/L (ref 0–40)
Albumin: 4.3 g/dL (ref 3.8–4.8)
Alkaline Phosphatase: 58 [IU]/L (ref 44–121)
BUN/Creatinine Ratio: 23 (ref 12–28)
BUN: 19 mg/dL (ref 8–27)
Bilirubin Total: 0.6 mg/dL (ref 0.0–1.2)
CO2: 24 mmol/L (ref 20–29)
Calcium: 9.8 mg/dL (ref 8.7–10.3)
Chloride: 100 mmol/L (ref 96–106)
Creatinine, Ser: 0.81 mg/dL (ref 0.57–1.00)
Globulin, Total: 2.1 g/dL (ref 1.5–4.5)
Glucose: 111 mg/dL — ABNORMAL HIGH (ref 70–99)
Potassium: 3.3 mmol/L — ABNORMAL LOW (ref 3.5–5.2)
Sodium: 139 mmol/L (ref 134–144)
Total Protein: 6.4 g/dL (ref 6.0–8.5)
eGFR: 75 mL/min/{1.73_m2} (ref >59)

## 2023-01-29 NOTE — Patient Instructions (Signed)
VISIT SUMMARY:  During today's visit, we discussed your recent weight loss, loss of appetite, and right upper quadrant pain. We also reviewed your history of primary cough headaches, urinary tract infections, and other health concerns. We have outlined a plan to address each of these issues and will conduct further tests to better understand your symptoms.  YOUR PLAN:  -UNINTENTIONAL WEIGHT LOSS AND ANOREXIA: You have experienced significant weight loss and a decrease in appetite without a clear cause. This could be related to your current medication and decreased food intake. We recommend increasing your food intake, even if it means consuming protein shakes. We will also perform a complete blood count and liver panel to investigate further.  -RIGHT UPPER QUADRANT PAIN: You have been experiencing intermittent pain in your right upper abdomen, which is not related to meals. This could be related to your gallbladder. We will perform an ultrasound to check for gallstones, and if none are found, we may consider a biliary scan.  -PRIMARY COUGH HEADACHE: Your headaches, which are triggered by coughing, have improved with the diuretic prescribed by your neurologist. We recommend continuing this treatment as directed by your neurologist.  -URINARY TRACT INFECTIONS: You have a history of recurrent urinary tract infections, which are now resolved with Mannose. Continue with the current treatment as directed.  INSTRUCTIONS:  Please follow up with the recommended blood tests and gallbladder ultrasound. Continue your current medications as prescribed by your neurologist and for your urinary tract infections. If you experience any new or worsening symptoms, please contact our office.

## 2023-01-29 NOTE — Progress Notes (Signed)
Acute Office Visit  Subjective:    Patient ID: Dana [name], female    DOB: [birth year], 77 y.o.   MRN: [id number]  Chief Complaint  Patient presents with   Abdominal Pain    HPI: The patient presents with a recent 11 pound weight loss since November 5th, loss of appetite, and right upper quadrant pain. The pain is described as random, not associated with meals, and seems to worsen with fatigue. The patient denies nausea and vomiting. She has a history of primary cough headaches and is currently on a diuretic prescribed by a neurologist, which she believes may be contributing to her symptoms. The diuretic has helped with the headaches, but the patient reports tingling in the hands and a sensation of a bubble in the ears, which were expected side effects. The patient also reports a history of four UTIs, a torn retina, and migraines. She has been treated for the UTIs and is no longer experiencing leakage. The patient also mentions a hemangioma on her liver or pancreas. Has a hx of pancreatic hemangioma- use to see Dr. Selena Batten at [facility]She reports constipation, which she attributes to the diuretic medication and decreased food intake.  Past Medical History:  Diagnosis Date   Anxiety disorder 07/19/2019   Chronic back pain 03/10/2018   Depression    Dyslipidemia 07/28/2019   Dyspnea on exertion 03/19/2019   Essential hypertension 03/19/2019   History of ovarian cancer    Hyperlipidemia 04/2015   Hypertension 02/2019   Hypothyroidism 03/10/2018   Impingement syndrome of right shoulder 05/12/2014   Insomnia due to other mental disorder 07/19/2019   Lumbar back pain 07/20/2019   Mixed hyperlipidemia 03/19/2019   Right shoulder pain 05/12/2014   Sciatic nerve pain, left 07/19/2019   Sciatic nerve pain, right 07/19/2019    Past Surgical History:  Procedure Laterality Date   ABDOMINAL HYSTERECTOMY  1986   Total, also had chemo   APPENDECTOMY  1986   LAPAROTOMY  1987   To rule out recurrent ovarian  cancer   TONSILLECTOMY      Family History  Problem Relation Age of Onset   Congestive Heart Failure Mother    Osteoarthritis Mother    Neuropathy Father     Social History   Socioeconomic History   Marital status: Married    Spouse name: Roe Coombs   Number of children: Not on file   Years of education: Not on file   Highest education level: Bachelor's degree (e.g., BA, AB, BS)  Occupational History   Not on file  Tobacco Use   Smoking status: Never   Smokeless tobacco: Never  Vaping Use   Vaping status: Never Used  Substance and Sexual Activity   Alcohol use: Yes    Alcohol/week: 1.0 standard drink of alcohol    Types: 1 Glasses of wine per week    Comment: occasionally   Drug use: Not Currently    Types: Solvent inhalants   Sexual activity: Yes    Partners: Male  Other Topics Concern   Not on file  Social History Narrative   Are you right handed or left handed? Right   Are you currently employed ? Retired   What is your current occupation?   Do you live at home alone? N   Who lives with you? Husband   What type of home do you live in: 1 story or 2 story? 1       Social Determinants of Health   Financial Resource Strain:  Low Risk  (01/28/2023)   Overall Financial Resource Strain (CARDIA)    Difficulty of Paying Living Expenses: Not hard at all  Food Insecurity: No Food Insecurity (01/28/2023)   Hunger Vital Sign    Worried About Running Out of Food in the Last Year: Never true    Ran Out of Food in the Last Year: Never true  Transportation Needs: No Transportation Needs (01/28/2023)   PRAPARE - Administrator, Civil Service (Medical): No    Lack of Transportation (Non-Medical): No  Physical Activity: Unknown (01/29/2023)   Exercise Vital Sign    Days of Exercise per Week: Patient declined    Minutes of Exercise per Session: 40 min  Stress: No Stress Concern Present (01/28/2023)   [employer] of Occupational Health - Occupational Stress  Questionnaire    Feeling of Stress : Only a little  Social Connections: Socially Integrated (01/28/2023)   Social Connection and Isolation Panel [NHANES]    Frequency of Communication with Friends and Family: More than three times a week    Frequency of Social Gatherings with Friends and Family: Three times a week    Attends Religious Services: More than 4 times per year    Active Member of Clubs or Organizations: Yes    Attends Banker Meetings: More than 4 times per year    Marital Status: Married  Catering manager Violence: Not At Risk (09/24/2022)   Humiliation, Afraid, Rape, and Kick questionnaire    Fear of Current or Ex-Partner: No    Emotionally Abused: No    Physically Abused: No    Sexually Abused: No    Outpatient Medications Prior to Visit  Medication Sig Dispense Refill   acetaZOLAMIDE (DIAMOX) 250 MG tablet Take 1 tablet (250 mg total) by mouth 2 (two) times daily. 60 tablet 5   ALPRAZolam (XANAX) 0.25 MG tablet TAKE 1 TABLET BY MOUTH AT BEDTIME AS NEEDED FOR ANXIETY. 30 tablet 5   ezetimibe (ZETIA) 10 MG tablet Take 1 tablet (10 mg total) by mouth daily. 90 tablet 3   hydrochlorothiazide (HYDRODIURIL) 25 MG tablet TAKE 1 TABLET BY MOUTH DAILY. 90 tablet 0   levothyroxine (SYNTHROID) 88 MCG tablet TAKE 1 TABLET BY MOUTH ONCE DAILY 90 tablet 2   meloxicam (MOBIC) 7.5 MG tablet Take 1 tablet (7.5 mg total) by mouth 2 (two) times daily as needed for pain. 60 tablet 1   nebivolol (BYSTOLIC) 5 MG tablet Take 1 tablet (5 mg total) by mouth daily. 30 tablet 11   nitrofurantoin, macrocrystal-monohydrate, (MACROBID) 100 MG capsule Take 100 mg by mouth 2 (two) times daily.     PREMARIN 0.625 MG tablet Take 1 tablet (0.625 mg total) by mouth daily. 90 tablet 3   rosuvastatin (CRESTOR) 5 MG tablet Take 1 tablet (5 mg total) by mouth 3 (three) times a week. 36 tablet 3   No facility-administered medications prior to visit.    Allergies  Allergen Reactions   Lipitor  [Atorvastatin]     myalgia   Penicillamine     Other reaction(s): Not available   Topamax [Topiramate] Other (See Comments)    BP decreased   Penicillins Rash    Other reaction(s): RASH    Pravastatin Other (See Comments)    Myalgia    Review of Systems  Constitutional:  Negative for chills, fatigue and fever.  HENT:  Negative for congestion, ear pain, postnasal drip, rhinorrhea, sinus pressure, sinus pain and sore throat.   Respiratory:  Negative  for cough and shortness of breath.   Cardiovascular:  Negative for chest pain.  Gastrointestinal:  Negative for diarrhea and nausea.  Neurological:  Negative for dizziness and headaches.       Objective:        01/29/2023    9:21 AM 12/31/2022    8:03 AM 12/09/2022    1:36 PM  Vitals with BMI  Height 5\' 4"  5\' 4"  5\' 3"   Weight 129 lbs 140 lbs 3 oz 141 lbs  BMI 22.13 24.05 24.98  Systolic 132 116 284  Diastolic 74 70 60  Pulse 56 67 78    No data found.   Physical Exam Vitals reviewed.  Constitutional:      Appearance: Normal appearance. She is well-developed.  Neck:     Vascular: No carotid bruit.  Cardiovascular:     Rate and Rhythm: Normal rate and regular rhythm.     Heart sounds: Normal heart sounds.  Pulmonary:     Effort: Pulmonary effort is normal. No respiratory distress.     Breath sounds: Normal breath sounds.  Abdominal:     General: Abdomen is flat. Bowel sounds are normal.     Palpations: Abdomen is soft.     Tenderness: There is abdominal tenderness in the right upper quadrant. There is no rebound. Positive signs include Murphy's sign.  Neurological:     Mental Status: She is alert and oriented to person, place, and time.  Psychiatric:        Mood and Affect: Mood normal.        Behavior: Behavior normal.     There are no preventive care reminders to display for this patient.   There are no preventive care reminders to display for this patient.   Lab Results  Component Value Date   TSH  1.900 03/18/2022   Lab Results  Component Value Date   WBC 6.7 01/29/2023   HGB 15.0 01/29/2023   HCT 45.6 01/29/2023   MCV 93 01/29/2023   PLT 398 01/29/2023   Lab Results  Component Value Date   NA 139 01/29/2023   K 3.3 (L) 01/29/2023   CO2 24 01/29/2023   GLUCOSE 111 (H) 01/29/2023   BUN 19 01/29/2023   CREATININE 0.81 01/29/2023   BILITOT 0.6 01/29/2023   ALKPHOS 58 01/29/2023   AST 19 01/29/2023   ALT 10 01/29/2023   PROT 6.4 01/29/2023   ALBUMIN 4.3 01/29/2023   CALCIUM 9.8 01/29/2023   EGFR 75 01/29/2023   Lab Results  Component Value Date   CHOL 189 09/09/2022   Lab Results  Component Value Date   HDL 88 09/09/2022   Lab Results  Component Value Date   LDLCALC 84 09/09/2022   Lab Results  Component Value Date   TRIG 94 09/09/2022   Lab Results  Component Value Date   CHOLHDL 2.1 09/09/2022   Lab Results  Component Value Date   HGBA1C 6.0 (H) 09/24/2022       Assessment & Plan:  Right upper quadrant abdominal tenderness without rebound tenderness Assessment & Plan: We will perform an ultrasound to check for gallstones, and if none are found, we may consider a biliary scan.  -RIGHT UPPER QUADRANT PAIN: You have been experiencing intermittent pain in your right upper abdomen, which is not related to meals. This could be related to your gallbladder. We will perform an ultrasound to check for gallstones, and if none are found, we may consider a biliary scan.  Orders: -  US ABDOMEN LIMITED RUQ (LIVER/GB); Future -     CBC with Differential/Platelet -     Comprehensive metabolic panel  Moderate protein-calorie malnutrition (HCC) Assessment & Plan: We recommend increasing your food intake, even if it means consuming protein shakes. We will also perform a complete blood count and liver panel to investigate further.   Primary cough headache Assessment & Plan: Currently on a diuretic prescribed by a neurologist. The diuretic has helped with the  headaches, but the patient reports tingling in the hands and a sensation of a bubble in the ears, which were expected side effects.    Acute cystitis without hematuria Assessment & Plan: You have a history of recurrent urinary tract infections, which are now resolved with Mannose. Continue with the current treatment as directed.      No orders of the defined types were placed in this encounter.   Orders Placed This Encounter  Procedures   US Abdomen Limited RUQ (LIVER/GB)   CBC with Differential/Platelet   Comprehensive metabolic panel    I,Marla I Leal-Borjas,acting as a scribe for Blane Ohara, MD.,have documented all relevant documentation on the behalf of Blane Ohara, MD,as directed by  Blane Ohara, MD while in the presence of Blane Ohara, MD.   I attest that I have reviewed this visit and agree with the plan scribed by my staff.   Blane Ohara, MD [name] [phone]

## 2023-01-31 DIAGNOSIS — R10811 Right upper quadrant abdominal tenderness: Secondary | ICD-10-CM | POA: Diagnosis not present

## 2023-02-01 ENCOUNTER — Encounter: Payer: Self-pay | Admitting: Family Medicine

## 2023-02-01 DIAGNOSIS — N3000 Acute cystitis without hematuria: Secondary | ICD-10-CM | POA: Insufficient documentation

## 2023-02-01 NOTE — Assessment & Plan Note (Addendum)
We will perform an ultrasound to check for gallstones, and if none are found, we may consider a biliary scan.  -RIGHT UPPER QUADRANT PAIN: You have been experiencing intermittent pain in your right upper abdomen, which is not related to meals. This could be related to your gallbladder. We will perform an ultrasound to check for gallstones, and if none are found, we may consider a biliary scan.

## 2023-02-01 NOTE — Assessment & Plan Note (Signed)
You have a history of recurrent urinary tract infections, which are now resolved with Mannose. Continue with the current treatment as directed.

## 2023-02-01 NOTE — Assessment & Plan Note (Signed)
Currently on a diuretic prescribed by a neurologist. The diuretic has helped with the headaches, but the patient reports tingling in the hands and a sensation of a bubble in the ears, which were expected side effects.

## 2023-02-01 NOTE — Assessment & Plan Note (Signed)
We recommend increasing your food intake, even if it means consuming protein shakes. We will also perform a complete blood count and liver panel to investigate further.

## 2023-02-02 DIAGNOSIS — R10811 Right upper quadrant abdominal tenderness: Secondary | ICD-10-CM | POA: Diagnosis not present

## 2023-02-03 ENCOUNTER — Other Ambulatory Visit: Payer: Self-pay

## 2023-02-03 ENCOUNTER — Encounter: Payer: Self-pay | Admitting: Family Medicine

## 2023-02-03 MED ORDER — POTASSIUM CHLORIDE CRYS ER 10 MEQ PO TBCR: 10.0000 meq | EXTENDED_RELEASE_TABLET | Freq: Every day | ORAL | 0 refills | Status: DC

## 2023-02-06 ENCOUNTER — Other Ambulatory Visit: Payer: Self-pay

## 2023-02-06 DIAGNOSIS — R10811 Right upper quadrant abdominal tenderness: Secondary | ICD-10-CM

## 2023-02-11 DIAGNOSIS — R10811 Right upper quadrant abdominal tenderness: Secondary | ICD-10-CM | POA: Diagnosis not present

## 2023-02-12 ENCOUNTER — Encounter: Payer: Self-pay | Admitting: Family Medicine

## 2023-02-17 ENCOUNTER — Other Ambulatory Visit: Payer: Self-pay

## 2023-02-17 DIAGNOSIS — R10811 Right upper quadrant abdominal tenderness: Secondary | ICD-10-CM

## 2023-03-05 ENCOUNTER — Other Ambulatory Visit: Payer: Self-pay | Admitting: Nurse Practitioner

## 2023-03-05 NOTE — Telephone Encounter (Signed)
 Prescription sent to pharmacy.

## 2023-03-10 DIAGNOSIS — N390 Urinary tract infection, site not specified: Secondary | ICD-10-CM | POA: Diagnosis not present

## 2023-03-10 DIAGNOSIS — H40013 Open angle with borderline findings, low risk, bilateral: Secondary | ICD-10-CM | POA: Diagnosis not present

## 2023-03-12 ENCOUNTER — Telehealth: Payer: Self-pay

## 2023-03-12 NOTE — Telephone Encounter (Signed)
 FYI Patient has some questions about her appointment with GI on 03/14/2023.She was not sure to go to see GI. She is losing weight and she feels tired. I recommended to keep her appointment with them for further evaluation and call us  back if she needs to see Dr Reinhold Carbine sooner. She has an appointment on 03/27/2023 with Dr Reinhold Carbine.

## 2023-03-14 ENCOUNTER — Ambulatory Visit: Payer: Medicare Other | Admitting: Gastroenterology

## 2023-03-14 ENCOUNTER — Other Ambulatory Visit (INDEPENDENT_AMBULATORY_CARE_PROVIDER_SITE_OTHER): Payer: Medicare Other

## 2023-03-14 ENCOUNTER — Encounter: Payer: Self-pay | Admitting: Gastroenterology

## 2023-03-14 VITALS — BP 100/80 | HR 62 | Ht 63.0 in | Wt 125.2 lb

## 2023-03-14 DIAGNOSIS — R1011 Right upper quadrant pain: Secondary | ICD-10-CM

## 2023-03-14 DIAGNOSIS — R634 Abnormal weight loss: Secondary | ICD-10-CM

## 2023-03-14 DIAGNOSIS — R5383 Other fatigue: Secondary | ICD-10-CM

## 2023-03-14 DIAGNOSIS — R1909 Other intra-abdominal and pelvic swelling, mass and lump: Secondary | ICD-10-CM | POA: Diagnosis not present

## 2023-03-14 DIAGNOSIS — M7989 Other specified soft tissue disorders: Secondary | ICD-10-CM | POA: Diagnosis not present

## 2023-03-14 DIAGNOSIS — G43909 Migraine, unspecified, not intractable, without status migrainosus: Secondary | ICD-10-CM

## 2023-03-14 LAB — TSH: TSH: 0.08 u[IU]/mL — ABNORMAL LOW (ref 0.35–5.50)

## 2023-03-14 NOTE — Progress Notes (Signed)
Chief Complaint: RUQ pain Primary GI ZO:[id number]  HPI: 78 year old female history of anxiety, depression, hypertension, ovarian cancer, hyperlipidemia, hypothyroidism, presents for evaluation of RUQ pain referred here by her PCP.  Patient seen by PCP 01/29/2023 reporting 11 pound weight loss over the course of 1 month, loss of appetite, RUQ pain that is random and not associated with eating.  There is mention of a hemangioma on her liver/pancreas for which she used to see Dr. Selena Batten at [facility] for. Upon review of care everywhere patient had MRI  08/14/2009 showing stable soft tissue density mass within the left upper quadrant in the gastrohepatic ligament most consistent with benign leiomyoma measuring 3.5 x 1.9 cm stable since 2009.  Also noted multiple simple cyst seen throughout the liver with the largest in the right lobe measuring 7.4 x 4.8 cm  Follow-up MRI 08/20/2010 which showed slightly increased size of gastrohepatic ligament soft tissue mass compatible with a benign lesion.  No lymphadenopathy.  WORKUP for RUQ pain x 1 month: CBC and CMP unrevealing with no anemia, normal platelets, normal LFTs  RUQ ultrasound showed no gallbladder wall thickening or gallstones.  CBD measuring 4 mm.  Normal liver.  HIDA scan was normal with ejection fraction 88%  The patient, with a history of migraines, presents with a chief complaint of right upper quadrant pain that started in November 2024. The pain is described as sharp, radiating through the back, and is not associated with eating or movement. It is noted to be worse when the patient is fatigued or has been on her feet for extended periods, and improves with rest and stretching.  In addition to the pain, the patient reports significant weight loss of 25 pounds since November 2024, decreased appetite, and severe fatigue. The fatigue is so profound that it has significantly limited her daily activities, including her regular exercise routine.  The  patient also mentions a change in her migraine pattern, with headaches now triggered by coughing or sneezing.  She was started on acetazolamide by her neurologist. The patient reports that the medication has been effective in controlling the migraines, but she questions if it may be contributing to her current symptoms of fatigue, weight loss, and decreased appetite as she feels all of her symptoms began around the time this medication was initiated.  She asked to come off of the medication and was recommended to continue.  Denies lower GI symptoms     PREVIOUS GI WORKUP   Reports colonoscopy that was 10 to 12 years ago that was normal done in [place].  States no previous polyps on any of her colonoscopies.  She has been doing Cologuard's regularly which has been negative with the last one being in 2023  Reports EGD in 2011 for soft tissue mass in gastrohepatic ligament which was normal  Past Medical History:  Diagnosis Date   Anxiety disorder 07/19/2019   Chronic back pain 03/10/2018   Depression    Dyslipidemia 07/28/2019   Dyspnea on exertion 03/19/2019   Essential hypertension 03/19/2019   History of ovarian cancer    Hyperlipidemia 04/2015   Hypertension 02/2019   Hypothyroidism 03/10/2018   Impingement syndrome of right shoulder 05/12/2014   Insomnia due to other mental disorder 07/19/2019   Lumbar back pain 07/20/2019   Mixed hyperlipidemia 03/19/2019   Right shoulder pain 05/12/2014   Sciatic nerve pain, left 07/19/2019   Sciatic nerve pain, right 07/19/2019   UTI (urinary tract infection)     Past Surgical History:  Procedure Laterality Date   ABDOMINAL HYSTERECTOMY  1986   Total, also had chemo   APPENDECTOMY  1986   ESOPHAGOGASTRODUODENOSCOPY     years ago   LAPAROTOMY  1987   To rule out recurrent ovarian cancer   TONSILLECTOMY      Current Outpatient Medications  Medication Sig Dispense Refill   acetaZOLAMIDE (DIAMOX) 250 MG tablet Take 1 tablet (250 mg  total) by mouth 2 (two) times daily. 60 tablet 5   ALPRAZolam (XANAX) 0.25 MG tablet TAKE 1 TABLET BY MOUTH AT BEDTIME AS NEEDED FOR ANXIETY. 30 tablet 5   conjugated estrogens (PREMARIN) vaginal cream Place 1 applicator vaginally as directed. 3 times a week     D-Mannose 500 MG CAPS Take 2 capsules by mouth daily.     ezetimibe (ZETIA) 10 MG tablet Take 1 tablet (10 mg total) by mouth daily. 90 tablet 3   hydrochlorothiazide (HYDRODIURIL) 25 MG tablet TAKE 1 TABLET BY MOUTH DAILY. 90 tablet 0   levothyroxine (SYNTHROID) 88 MCG tablet TAKE 1 TABLET BY MOUTH ONCE DAILY 90 tablet 2   meloxicam (MOBIC) 7.5 MG tablet Take 1 tablet (7.5 mg total) by mouth 2 (two) times daily as needed for pain. 60 tablet 1   nebivolol (BYSTOLIC) 5 MG tablet Take 1 tablet (5 mg total) by mouth daily. 30 tablet 11   nitrofurantoin (MACRODANTIN) 100 MG capsule Take 100 mg by mouth 2 (two) times daily.     potassium chloride (KLOR-CON M) 10 MEQ tablet Take 1 tablet (10 mEq total) by mouth daily. 90 tablet 0   rosuvastatin (CRESTOR) 5 MG tablet TAKE 1 TABLET BY MOUTH 3 TIMES A WEEK 36 tablet 2   No current facility-administered medications for this visit.    Allergies as of 03/14/2023 - Review Complete 03/14/2023  Allergen Reaction Noted   Lipitor [atorvastatin]  08/02/2020   Penicillamine  12/03/2021   Topamax [topiramate] Other (See Comments) 06/04/2021   Penicillins Rash 05/13/2013   Pravastatin Other (See Comments) 03/19/2019    Family History  Problem Relation Age of Onset   Congestive Heart Failure Mother    Osteoarthritis Mother    Neuropathy Father    Esophageal cancer Neg Hx    Colon cancer Neg Hx     Social History   Socioeconomic History   Marital status: Married    Spouse name: Roe Coombs   Number of children: Not on file   Years of education: Not on file   Highest education level: Bachelor's degree (e.g., BA, AB, BS)  Occupational History   Not on file  Tobacco Use   Smoking status: Never    Smokeless tobacco: Never  Vaping Use   Vaping status: Never Used  Substance and Sexual Activity   Alcohol use: Not Currently    Alcohol/week: 1.0 standard drink of alcohol    Types: 1 Glasses of wine per week    Comment: occasionally   Drug use: Not Currently    Types: Solvent inhalants   Sexual activity: Yes    Partners: Male  Other Topics Concern   Not on file  Social History Narrative   Are you right handed or left handed? Right   Are you currently employed ? Retired   What is your current occupation?   Do you live at home alone? N   Who lives with you? Husband   What type of home do you live in: 1 story or 2 story? 1       Social Drivers  of Health   Financial Resource Strain: Low Risk  (01/28/2023)   Overall Financial Resource Strain (CARDIA)    Difficulty of Paying Living Expenses: Not hard at all  Food Insecurity: No Food Insecurity (01/28/2023)   Hunger Vital Sign    Worried About Running Out of Food in the Last Year: Never true    Ran Out of Food in the Last Year: Never true  Transportation Needs: No Transportation Needs (01/28/2023)   PRAPARE - Administrator, Civil Service (Medical): No    Lack of Transportation (Non-Medical): No  Physical Activity: Unknown (01/29/2023)   Exercise Vital Sign    Days of Exercise per Week: Patient declined    Minutes of Exercise per Session: 40 min  Stress: No Stress Concern Present (01/28/2023)   [employer] of Occupational Health - Occupational Stress Questionnaire    Feeling of Stress : Only a little  Social Connections: Socially Integrated (01/28/2023)   Social Connection and Isolation Panel [NHANES]    Frequency of Communication with Friends and Family: More than three times a week    Frequency of Social Gatherings with Friends and Family: Three times a week    Attends Religious Services: More than 4 times per year    Active Member of Clubs or Organizations: Yes    Attends Banker Meetings: More  than 4 times per year    Marital Status: Married  Catering manager Violence: Not At Risk (09/24/2022)   Humiliation, Afraid, Rape, and Kick questionnaire    Fear of Current or Ex-Partner: No    Emotionally Abused: No    Physically Abused: No    Sexually Abused: No    Review of Systems:    Constitutional: No weight loss, fever, chills, weakness or fatigue HEENT: Eyes: No change in vision               Ears, Nose, Throat:  No change in hearing or congestion Skin: No rash or itching Cardiovascular: No chest pain, chest pressure or palpitations   Respiratory: No SOB or cough Gastrointestinal: See HPI and otherwise negative Genitourinary: No dysuria or change in urinary frequency Neurological: No headache, dizziness or syncope Musculoskeletal: No new muscle or joint pain Hematologic: No bleeding or bruising Psychiatric: No history of depression or anxiety    Physical Exam:  Vital signs: BP 100/80 (BP Location: Left Arm, Patient Position: Sitting, Cuff Size: Normal)   Pulse 62   Ht 5\' 3"  (1.6 m)   Wt 125 lb 4 oz (56.8 kg)   LMP  (LMP Unknown)   SpO2 98%   BMI 22.19 kg/m   Constitutional: NAD, Well developed, Well nourished, alert and cooperative Head:  Normocephalic and atraumatic. Eyes:   PEERL, EOMI. No icterus. Conjunctiva pink. Respiratory: Respirations even and unlabored. Lungs clear to auscultation bilaterally.   No wheezes, crackles, or rhonchi.  Cardiovascular:  Regular rate and rhythm. No peripheral edema, cyanosis or pallor.  Gastrointestinal:  Soft, nondistended, nontender. No rebound or guarding. Normal bowel sounds. No appreciable masses or hepatomegaly. Rectal:  Not performed.  Msk:  Symmetrical without gross deformities. Without edema, no deformity or joint abnormality.  Neurologic:  Alert and  oriented x4;  grossly normal neurologically.  Skin:   Dry and intact without significant lesions or rashes. Psychiatric: Oriented to person, place and time. Demonstrates  good judgement and reason without abnormal affect or behaviors.   RELEVANT LABS AND IMAGING: CBC    Component Value Date/Time   WBC 6.7 01/29/2023  1029   RBC 4.88 01/29/2023 1029   HGB 15.0 01/29/2023 1029   HCT 45.6 01/29/2023 1029   PLT 398 01/29/2023 1029   MCV 93 01/29/2023 1029   MCH 30.7 01/29/2023 1029   MCHC 32.9 01/29/2023 1029   RDW 12.8 01/29/2023 1029   LYMPHSABS 1.9 01/29/2023 1029   EOSABS 0.1 01/29/2023 1029   BASOSABS 0.0 01/29/2023 1029    CMP     Component Value Date/Time   NA 139 01/29/2023 1029   K 3.3 (L) 01/29/2023 1029   CL 100 01/29/2023 1029   CO2 24 01/29/2023 1029   GLUCOSE 111 (H) 01/29/2023 1029   BUN 19 01/29/2023 1029   CREATININE 0.81 01/29/2023 1029   CALCIUM 9.8 01/29/2023 1029   PROT 6.4 01/29/2023 1029   ALBUMIN 4.3 01/29/2023 1029   AST 19 01/29/2023 1029   ALT 10 01/29/2023 1029   ALKPHOS 58 01/29/2023 1029   BILITOT 0.6 01/29/2023 1029   GFRNONAA 86 04/21/2020 0756   GFRAA 99 04/21/2020 0756     Assessment/Plan:   Soft tissue mass of gastrohepatic ligament Previously [facility] by [facility] would last MRI in 2012 showing stable lesion since 2009.  Normal LFTs.  Her symptoms of weight loss, fatigue, and RUQ pain all began with the start of acetazolamide.  Could be medication side effect versus musculoskeletal with description of RUQ pain.  However, with her history of the soft tissue mass it is best to evaluate. - MRI abdomen with and without contrast - Reassuringly LFTs are normal - Previous EGD with [facility] was normal    Unexplained weight loss and fatigue Significant weight loss of 25 pounds since November 2024, associated with fatigue and decreased appetite. No changes in bowel habits or evidence of GI bleeding. Past medical history includes a stable soft tissue density mass between the liver and pancreas, last evaluated in 2012.  Notes the symptoms began after initiation of acetazolamide by neurologist and loss of appetite is a stated  side effect with this medication.  Normal labs.  Last TSH 2023 was normal -- Order MRI to reassess the stability of the previously identified mass. -- Check thyroid function tests today to rule out thyroid dysfunction as a cause of symptoms. -- Consider discussing with neurologist alternatives to acetazolamide if this is contributing to her symptoms  Right upper quadrant pain Intermittent pain, not associated with eating, but worse with fatigue and prolonged standing as well as movement.. Pain is relieved by lying down. No tenderness on examination.  Normal RUQ ultrasound and HIDA scan. -- As the pain does not appear to be GI in origin, consider musculoskeletal causes. --Will evaluate during MRI to check stability of previous soft tissue mass in gastropathic ligament.  Colon cancer screening Last colonoscopy 10-12 years ago, all previous colonoscopies normal. Has been using Cologuard for screening, all results negative. --No immediate action required, continue current screening strategy as per primary care physician's plan.  Migraines Managed by another physician with acetazolamide. -No immediate action required, continue current management plan. Consider discussing potential side effects and interactions with prescribing physician and pharmacist.  Follow-up After results of MRI and thyroid function tests are available.     Patient was assigned to Dr. Adela Lank today  Boone Master, PA-C [facility] 03/14/2023, 11:14 AM  Cc: Blane Ohara, MD

## 2023-03-14 NOTE — Patient Instructions (Addendum)
You have been scheduled for an MRI at [facility] on 03-18-23. Your appointment time is 130pm. Please arrive to admitting (at main entrance of the hospital) 30 minutes prior to your appointment time for registration purposes. Please make certain not to have anything to eat or drink 4 hours prior to your test. In addition, if you have any metal in your body, have a pacemaker or defibrillator, please be sure to let your ordering physician know. This test typically takes 45 minutes to 1 hour to complete. Should you need to reschedule, please call [phone] to do so.  Your provider has requested that you go to the basement level for lab work before leaving today. Press "B" on the elevator. The lab is located at the first door on the left as you exit the elevator.  _______________________________________________________  If your blood pressure at your visit was 140/90 or greater, please contact your primary care physician to follow up on this.  _______________________________________________________  If you are age 75 or older, your body mass index should be between 23-30. Your Body mass index is 22.19 kg/m. If this is out of the aforementioned range listed, please consider follow up with your Primary Care Provider.  If you are age 19 or younger, your body mass index should be between 19-25. Your Body mass index is 22.19 kg/m. If this is out of the aformentioned range listed, please consider follow up with your Primary Care Provider.   ________________________________________________________  The [facility] providers would like to encourage you to use [facility] to communicate with providers for non-urgent requests or questions.  Due to long hold times on the telephone, sending your provider a message by [facility] may be a faster and more efficient way to get a response.  Please allow 48 business hours for a response.  Please remember that this is for non-urgent requests.   _______________________________________________________  It was a pleasure to see you today!  Thank you for trusting me with your gastrointestinal care!

## 2023-03-14 NOTE — Progress Notes (Signed)
Agree with assessment plan as outlined.  Sounds like this is more so musculoskeletal but agree with cross-sectional imaging to make sure nothing else going on given her history.  Thanks

## 2023-03-17 ENCOUNTER — Encounter: Payer: Self-pay | Admitting: Family Medicine

## 2023-03-17 ENCOUNTER — Telehealth: Payer: Self-pay

## 2023-03-17 ENCOUNTER — Other Ambulatory Visit: Payer: Self-pay

## 2023-03-17 DIAGNOSIS — E034 Atrophy of thyroid (acquired): Secondary | ICD-10-CM

## 2023-03-17 MED ORDER — LEVOTHYROXINE SODIUM 75 MCG PO TABS: 75.0000 ug | ORAL_TABLET | Freq: Every day | ORAL | 0 refills | Status: DC

## 2023-03-17 NOTE — Telephone Encounter (Signed)
Patient called stating she feels very fatigue and questioned if you are going to adjust her tyroid medication. Please advise. Labs and office note are available from [facility] from 03/14/2023.   Copied from CRM [id number]. Topic: Clinical - Medication Question >> Mar 17, 2023  9:12 AM Prudencio Pair wrote: Reason for CRM: Patient is requesting for Dr. Renea Ee nurse to give her a call. States she has some questions about some medication & also some questions from the PA that Dr. Sedalia Muta referred her to. CB #: [id number].

## 2023-03-17 NOTE — Telephone Encounter (Signed)
Patient informed of results, rx sent, appt scheduled, labs ordered.

## 2023-03-17 NOTE — Telephone Encounter (Signed)
Patient informed, rx sent, labs ordered, appt scheduled.

## 2023-03-18 ENCOUNTER — Ambulatory Visit (HOSPITAL_COMMUNITY)
Admission: RE | Admit: 2023-03-18 | Discharge: 2023-03-18 | Disposition: A | Discharge status: Home / Self Care | Payer: Medicare Other | Source: Ambulatory Visit | Attending: Gastroenterology | Admitting: Gastroenterology

## 2023-03-18 DIAGNOSIS — R634 Abnormal weight loss: Secondary | ICD-10-CM | POA: Diagnosis not present

## 2023-03-18 DIAGNOSIS — R5383 Other fatigue: Secondary | ICD-10-CM | POA: Insufficient documentation

## 2023-03-18 DIAGNOSIS — Z8543 Personal history of malignant neoplasm of ovary: Secondary | ICD-10-CM | POA: Diagnosis not present

## 2023-03-18 DIAGNOSIS — M7989 Other specified soft tissue disorders: Secondary | ICD-10-CM | POA: Diagnosis not present

## 2023-03-18 DIAGNOSIS — R1011 Right upper quadrant pain: Secondary | ICD-10-CM | POA: Diagnosis not present

## 2023-03-18 MED ORDER — GADOBUTROL 1 MMOL/ML IV SOLN
5.5000 mL | Freq: Once | INTRAVENOUS | Status: AC | PRN
  Administered 2023-03-18: 5.5 mL via INTRAVENOUS

## 2023-03-26 NOTE — Progress Notes (Signed)
 Subjective:  Patient ID: Dana [name], female    DOB: 1945-03-21  Age: 78 y.o. MRN: [id number]  Chief Complaint  Patient presents with   Medical Management of Chronic Issues    HPI Hyperlipidemia: Current medications: Zetia 10mg  taking one tablet daily, Rosuvastatin 5 mg take 3 times a week. Tolerating well.   Hypertension: HCTZ 25mg  take 1 tablet daily, Nebivolol HCL 5 mg one tablet daily. Bp is great today. Eat healthy. Exercise: walking at least 2-3 times per week. .   Hypothyroidism: Current medications: Levothyroxine 75 mcg take 1 tablet daily.  Anxiety: Current medications: Xanax 0.25mg  take 1/2 tablet daily at night  History of Present Illness The patient, with a history of recurrent urinary tract infections and thyroid issues, presents with debilitating fatigue. The fatigue, which started in November, has been so severe that the patient had to cancel a family vacation due to lack of stamina. The patient reports some improvement but still struggles with moving from point A to point B and sometimes needs to hold on due to feeling off balance. The patient denies feeling weak but admits to feeling a little off balance when standing with eyes closed.  In addition to fatigue, the patient has been experiencing generalized itching, particularly in the scalp, eyes, and back. The patient has tried antihistamines without relief. The patient also reports cold intolerance, needing to wrap up in a blanket and sleep with extra layers. The patient has also been experiencing constipation, which she is trying to manage with Miralax.  The patient also mentions a history of recurrent urinary tract infections, having had five since September. The patient has been taking over-the-counter D-Mannose and Estradiol, which seemed to help until the most recent infection. The patient expresses frustration with the delay in receiving medication for the most recent infection from her urologist and plans to go  to urgent care for future infections.  The patient also mentions a recent change in thyroid medication in mid January.  Oddly enough her TSH was low and I adjusted her thyroid medicine to a higher dose.   The patient's appetite has returned, and she is maintaining her current weight.     01/29/2023    9:29 AM 09/24/2022    8:14 AM 09/24/2022    8:12 AM 09/19/2021    1:26 PM 11/10/2020    8:41 AM  Depression screen PHQ 2/9  Decreased Interest 0 0 0 0 0  Down, Depressed, Hopeless 0 0 0 0 0  PHQ - 2 Score 0 0 0 0 0  Altered sleeping 2 1     Tired, decreased energy 3 0     Change in appetite 3 0     Feeling bad or failure about yourself  0 0     Trouble concentrating 2 1     Moving slowly or fidgety/restless 3 0     Suicidal thoughts 0 0     PHQ-9 Score 13 2     Difficult doing work/chores Not difficult at all Not difficult at all           01/29/2023    9:29 AM  Fall Risk   Falls in the past year? 0  Number falls in past yr: 0  Injury with Fall? 0  Risk for fall due to : No Fall Risks  Follow up Falls evaluation completed    Patient Care Team: Blane Ohara, MD as PCP - General (Family Medicine) Georgeanna Lea, MD as Consulting Physician (Cardiology)  Corrin Parker. (Inactive) as Referring Physician (Obstetrics and Gynecology) Mateo Flow, MD as Consulting Physician (Ophthalmology) Drema Dallas, DO as Consulting Physician (Neurology)   Review of Systems  Constitutional:  Negative for chills, fatigue and fever.  HENT:  Negative for congestion, ear pain, rhinorrhea and sore throat.   Respiratory:  Negative for cough and shortness of breath.   Cardiovascular:  Negative for chest pain.  Gastrointestinal:  Negative for abdominal pain, constipation, diarrhea, nausea and vomiting.  Genitourinary:  Negative for dysuria and urgency.  Musculoskeletal:  Negative for back pain and myalgias.  Neurological:  Negative for dizziness, weakness, light-headedness and headaches.   Psychiatric/Behavioral:  Negative for dysphoric mood. The patient is not nervous/anxious.     Current Outpatient Medications on File Prior to Visit  Medication Sig Dispense Refill   acetaZOLAMIDE (DIAMOX) 250 MG tablet Take 1 tablet (250 mg total) by mouth 2 (two) times daily. 60 tablet 5   ALPRAZolam (XANAX) 0.25 MG tablet TAKE 1 TABLET BY MOUTH AT BEDTIME AS NEEDED FOR ANXIETY. 30 tablet 5   conjugated estrogens (PREMARIN) vaginal cream Place 1 applicator vaginally as directed. 3 times a week     D-Mannose 500 MG CAPS Take 2 capsules by mouth daily.     ezetimibe (ZETIA) 10 MG tablet Take 1 tablet (10 mg total) by mouth daily. 90 tablet 3   hydrochlorothiazide (HYDRODIURIL) 25 MG tablet TAKE 1 TABLET BY MOUTH DAILY. 90 tablet 0   levothyroxine (SYNTHROID) 75 MCG tablet Take 1 tablet (75 mcg total) by mouth daily. 60 tablet 0   meloxicam (MOBIC) 7.5 MG tablet Take 1 tablet (7.5 mg total) by mouth 2 (two) times daily as needed for pain. 60 tablet 1   nebivolol (BYSTOLIC) 5 MG tablet Take 1 tablet (5 mg total) by mouth daily. 30 tablet 11   nitrofurantoin (MACRODANTIN) 100 MG capsule Take 100 mg by mouth 2 (two) times daily.     potassium chloride (KLOR-CON M) 10 MEQ tablet Take 1 tablet (10 mEq total) by mouth daily. 90 tablet 0   rosuvastatin (CRESTOR) 5 MG tablet TAKE 1 TABLET BY MOUTH 3 TIMES A WEEK 36 tablet 2   No current facility-administered medications on file prior to visit.   Past Medical History:  Diagnosis Date   Anxiety disorder 07/19/2019   Chronic back pain 03/10/2018   Depression    Dyslipidemia 07/28/2019   Dyspnea on exertion 03/19/2019   Essential hypertension 03/19/2019   History of ovarian cancer    Hyperlipidemia 04/2015   Hypertension 02/2019   Hypothyroidism 03/10/2018   Impingement syndrome of right shoulder 05/12/2014   Insomnia due to other mental disorder 07/19/2019   Lumbar back pain 07/20/2019   Mixed hyperlipidemia 03/19/2019   Right shoulder pain  05/12/2014   Sciatic nerve pain, left 07/19/2019   Sciatic nerve pain, right 07/19/2019   UTI (urinary tract infection)    Past Surgical History:  Procedure Laterality Date   ABDOMINAL HYSTERECTOMY  1986   Total, also had chemo   APPENDECTOMY  1986   ESOPHAGOGASTRODUODENOSCOPY     years ago   LAPAROTOMY  1987   To rule out recurrent ovarian cancer   TONSILLECTOMY      Family History  Problem Relation Age of Onset   Congestive Heart Failure Mother    Osteoarthritis Mother    Neuropathy Father    Esophageal cancer Neg Hx    Colon cancer Neg Hx    Social History   Socioeconomic History  Marital status: Married    Spouse name: Roe Coombs   Number of children: Not on file   Years of education: Not on file   Highest education level: Bachelor's degree (e.g., BA, AB, BS)  Occupational History   Not on file  Tobacco Use   Smoking status: Never   Smokeless tobacco: Never  Vaping Use   Vaping status: Never Used  Substance and Sexual Activity   Alcohol use: Not Currently    Alcohol/week: 1.0 standard drink of alcohol    Types: 1 Glasses of wine per week    Comment: occasionally   Drug use: Not Currently    Types: Solvent inhalants   Sexual activity: Yes    Partners: Male  Other Topics Concern   Not on file  Social History Narrative   Are you right handed or left handed? Right   Are you currently employed ? Retired   What is your current occupation?   Do you live at home alone? N   Who lives with you? Husband   What type of home do you live in: 1 story or 2 story? 1       Social Drivers of Corporate investment banker Strain: Low Risk  (03/23/2023)   Overall Financial Resource Strain (CARDIA)    Difficulty of Paying Living Expenses: Not hard at all  Food Insecurity: No Food Insecurity (03/23/2023)   Hunger Vital Sign    Worried About Running Out of Food in the Last Year: Never true    Ran Out of Food in the Last Year: Never true  Transportation Needs: No Transportation  Needs (03/23/2023)   PRAPARE - Administrator, Civil Service (Medical): No    Lack of Transportation (Non-Medical): No  Physical Activity: Inactive (03/23/2023)   Exercise Vital Sign    Days of Exercise per Week: 0 days    Minutes of Exercise per Session: 40 min  Stress: No Stress Concern Present (03/23/2023)   [employer] of Occupational Health - Occupational Stress Questionnaire    Feeling of Stress : Only a little  Social Connections: Socially Integrated (03/23/2023)   Social Connection and Isolation Panel [NHANES]    Frequency of Communication with Friends and Family: More than three times a week    Frequency of Social Gatherings with Friends and Family: Three times a week    Attends Religious Services: More than 4 times per year    Active Member of Clubs or Organizations: Yes    Attends Banker Meetings: More than 4 times per year    Marital Status: Married    Objective:  BP 126/72   Pulse 68   Temp 98.2 F (36.8 C)   Ht 5\' 3"  (1.6 m)   Wt 124 lb (56.2 kg)   LMP  (LMP Unknown)   SpO2 98%   BMI 21.97 kg/m      03/27/2023    8:01 AM 03/14/2023   10:37 AM 01/29/2023    9:21 AM  BP/Weight  Systolic BP 126 100 132  Diastolic BP 72 80 74  Wt. (Lbs) 124 125.25 129  BMI 21.97 kg/m2 22.19 kg/m2 22.14 kg/m2    Physical Exam Vitals reviewed.  Constitutional:      Appearance: Normal appearance.  Neck:     Vascular: No carotid bruit.  Cardiovascular:     Rate and Rhythm: Normal rate and regular rhythm.     Heart sounds: Normal heart sounds.  Pulmonary:     Effort: Pulmonary  effort is normal. No respiratory distress.     Breath sounds: Normal breath sounds.  Abdominal:     General: Abdomen is flat. Bowel sounds are normal.     Palpations: Abdomen is soft.     Tenderness: There is no abdominal tenderness.  Skin:    Findings: No rash.  Neurological:     General: No focal deficit present.     Mental Status: She is alert and oriented to  person, place, and time.     Cranial Nerves: No cranial nerve deficit.     Sensory: No sensory deficit.     Motor: No weakness.     Coordination: Coordination normal.     Gait: Gait normal.  Psychiatric:        Mood and Affect: Mood normal.        Behavior: Behavior normal.     Diabetic Foot Exam - Simple   No data filed      Lab Results  Component Value Date   WBC 6.7 01/29/2023   HGB 15.0 01/29/2023   HCT 45.6 01/29/2023   PLT 398 01/29/2023   GLUCOSE 111 (H) 01/29/2023   CHOL 189 09/09/2022   TRIG 94 09/09/2022   HDL 88 09/09/2022   LDLCALC 84 09/09/2022   ALT 10 01/29/2023   AST 19 01/29/2023   NA 139 01/29/2023   K 3.3 (L) 01/29/2023   CL 100 01/29/2023   CREATININE 0.81 01/29/2023   BUN 19 01/29/2023   CO2 24 01/29/2023   TSH 0.08 (L) 03/14/2023   HGBA1C 6.0 (H) 09/24/2022      Assessment & Plan:    Essential hypertension Assessment & Plan: Well controlled.  No changes to medicines. Continue HCTZ 25mg  take 1 tablet daily, Nebivolol HCL 5 mg takes 1 tablet dail Continue to work on eating a healthy diet and exercise.  Labs drawn today.    Orders: -     CBC with Differential/Platelet -     Comprehensive metabolic panel  Mixed hyperlipidemia Assessment & Plan: Well controlled.  No changes to medicines. Continue Zetia 10mg  taking one tablet daily, Rosuvastatin 5 mg take 3 times a week. Continue to work on eating a healthy diet and exercise.  Labs drawn today.    Orders: -     Lipid panel  Prediabetes Assessment & Plan: Continue to work on healthy diet and exercise.    Orders: -     Hemoglobin A1c  Anxiety disorder, unspecified type Assessment & Plan: Continue xanax.   Hypothyroidism due to acquired atrophy of thyroid Assessment & Plan: Recent adjustment of thyroid medication. Patient reports symptoms suggestive of hypothyroidism including fatigue, cold intolerance, constipation, and possibly itching. -Review recent thyroid function  tests and adjust medication as necessary.   Other fatigue Assessment & Plan: Check labs  Orders: -     Sedimentation rate -     C-reactive protein -     Mitochondrial/smooth muscle ab pnl  Pruritus Assessment & Plan: No rash present. Not relieved by antihistamines. Possible association with thyroid dysfunction. -Order additional lab tests to investigate cause of itching.  Orders: -     Mitochondrial/smooth muscle ab pnl  Unintentional weight loss Assessment & Plan: Check labs  Orders: -     Mitochondrial/smooth muscle ab pnl  Imbalance Assessment & Plan: Difficulty with heel-to-toe walking and slight off-balance sensation with Romberg's test. -Advise patient to be cautious to prevent falls, especially during solo walks.  Orders: -     B12 and Folate  Panel -     Methylmalonic acid, serum  Acute recurrent cystitis without hematuria Assessment & Plan: Five episodes since September. Currently on over-the-counter D-Mannose and Estradiol. Recent delay in treatment initiation due to weekend onset and delayed prescription. -Consider prophylactic antibiotic therapy if recurrences continue. -Advise patient to utilize virtual visits for prompt symptom evaluation and treatment initiation.     No orders of the defined types were placed in this encounter.   Orders Placed This Encounter  Procedures   CBC with Differential/Platelet   Comprehensive metabolic panel   Hemoglobin A1c   Lipid panel   B12 and Folate Panel   Methylmalonic acid, serum   Sedimentation rate   C-reactive protein   Mitochondrial/smooth muscle ab pnl     Follow-up: Return in about 5 weeks (around 05/01/2023) for chronic follow up.   I,Marla I Leal-Borjas,acting as a scribe for Blane Ohara, MD.,have documented all relevant documentation on the behalf of Blane Ohara, MD,as directed by  Blane Ohara, MD while in the presence of Blane Ohara, MD.   An After Visit Summary was printed and given to the  patient.  I attest that I have reviewed this visit and agree with the plan scribed by my staff.   Blane Ohara, MD [facility] [phone]

## 2023-03-27 ENCOUNTER — Ambulatory Visit (INDEPENDENT_AMBULATORY_CARE_PROVIDER_SITE_OTHER): Payer: Medicare Other | Admitting: Family Medicine

## 2023-03-27 ENCOUNTER — Encounter: Payer: Self-pay | Admitting: Family Medicine

## 2023-03-27 VITALS — BP 126/72 | HR 68 | Temp 98.2°F | Ht 63.0 in | Wt 124.0 lb

## 2023-03-27 DIAGNOSIS — R5383 Other fatigue: Secondary | ICD-10-CM

## 2023-03-27 DIAGNOSIS — R2689 Other abnormalities of gait and mobility: Secondary | ICD-10-CM | POA: Diagnosis not present

## 2023-03-27 DIAGNOSIS — R634 Abnormal weight loss: Secondary | ICD-10-CM

## 2023-03-27 DIAGNOSIS — F419 Anxiety disorder, unspecified: Secondary | ICD-10-CM

## 2023-03-27 DIAGNOSIS — E782 Mixed hyperlipidemia: Secondary | ICD-10-CM | POA: Diagnosis not present

## 2023-03-27 DIAGNOSIS — E034 Atrophy of thyroid (acquired): Secondary | ICD-10-CM

## 2023-03-27 DIAGNOSIS — I10 Essential (primary) hypertension: Secondary | ICD-10-CM | POA: Diagnosis not present

## 2023-03-27 DIAGNOSIS — N3000 Acute cystitis without hematuria: Secondary | ICD-10-CM

## 2023-03-27 DIAGNOSIS — R7303 Prediabetes: Secondary | ICD-10-CM

## 2023-03-27 DIAGNOSIS — L299 Pruritus, unspecified: Secondary | ICD-10-CM

## 2023-03-30 DIAGNOSIS — R634 Abnormal weight loss: Secondary | ICD-10-CM | POA: Insufficient documentation

## 2023-03-30 DIAGNOSIS — R5383 Other fatigue: Secondary | ICD-10-CM | POA: Insufficient documentation

## 2023-03-30 DIAGNOSIS — R2689 Other abnormalities of gait and mobility: Secondary | ICD-10-CM | POA: Insufficient documentation

## 2023-03-30 DIAGNOSIS — L299 Pruritus, unspecified: Secondary | ICD-10-CM | POA: Insufficient documentation

## 2023-03-30 NOTE — Assessment & Plan Note (Signed)
Continue xanax 

## 2023-03-30 NOTE — Assessment & Plan Note (Signed)
Well controlled.  No changes to medicines. Continue Zetia 10mg  taking one tablet daily, Rosuvastatin 5 mg take 3 times a week. Continue to work on eating a healthy diet and exercise.  Labs drawn today.

## 2023-03-30 NOTE — Assessment & Plan Note (Signed)
 Check labs

## 2023-03-30 NOTE — Assessment & Plan Note (Signed)
No rash present. Not relieved by antihistamines. Possible association with thyroid dysfunction. -Order additional lab tests to investigate cause of itching.

## 2023-03-30 NOTE — Assessment & Plan Note (Signed)
Recent adjustment of thyroid medication. Patient reports symptoms suggestive of hypothyroidism including fatigue, cold intolerance, constipation, and possibly itching. -Review recent thyroid function tests and adjust medication as necessary.

## 2023-03-30 NOTE — Assessment & Plan Note (Signed)
Well controlled.  No changes to medicines. Continue HCTZ 25mg  take 1 tablet daily, Nebivolol HCL 5 mg takes 1 tablet dail Continue to work on eating a healthy diet and exercise.  Labs drawn today.

## 2023-03-30 NOTE — Assessment & Plan Note (Signed)
Five episodes since September. Currently on over-the-counter D-Mannose and Estradiol. Recent delay in treatment initiation due to weekend onset and delayed prescription. -Consider prophylactic antibiotic therapy if recurrences continue. -Advise patient to utilize virtual visits for prompt symptom evaluation and treatment initiation.

## 2023-03-30 NOTE — Assessment & Plan Note (Signed)
Difficulty with heel-to-toe walking and slight off-balance sensation with Romberg's test. -Advise patient to be cautious to prevent falls, especially during solo walks.

## 2023-03-30 NOTE — Assessment & Plan Note (Signed)
Continue to work on healthy diet and exercise.   

## 2023-03-31 ENCOUNTER — Encounter: Payer: Self-pay | Admitting: Family Medicine

## 2023-03-31 LAB — CBC WITH DIFFERENTIAL/PLATELET
Basophils Absolute: 0 10*3/uL (ref 0.0–0.2)
Basos: 1 %
EOS (ABSOLUTE): 0.1 10*3/uL (ref 0.0–0.4)
Eos: 1 %
Hematocrit: 43.1 % (ref 34.0–46.6)
Hemoglobin: 14.3 g/dL (ref 11.1–15.9)
Immature Grans (Abs): 0 10*3/uL (ref 0.0–0.1)
Immature Granulocytes: 0 %
Lymphocytes Absolute: 1.4 10*3/uL (ref 0.7–3.1)
Lymphs: 22 %
MCH: 31.2 pg (ref 26.6–33.0)
MCHC: 33.2 g/dL (ref 31.5–35.7)
MCV: 94 fL (ref 79–97)
Monocytes Absolute: 0.4 10*3/uL (ref 0.1–0.9)
Monocytes: 7 %
Neutrophils Absolute: 4.4 10*3/uL (ref 1.4–7.0)
Neutrophils: 69 %
Platelets: 373 10*3/uL (ref 150–450)
RBC: 4.59 x10E6/uL (ref 3.77–5.28)
RDW: 12.8 % (ref 11.7–15.4)
WBC: 6.3 10*3/uL (ref 3.4–10.8)

## 2023-03-31 LAB — COMPREHENSIVE METABOLIC PANEL
ALT: 10 [IU]/L (ref 0–32)
AST: 15 [IU]/L (ref 0–40)
Albumin: 4.2 g/dL (ref 3.8–4.8)
Alkaline Phosphatase: 57 [IU]/L (ref 44–121)
BUN/Creatinine Ratio: 23 (ref 12–28)
BUN: 19 mg/dL (ref 8–27)
Bilirubin Total: 0.6 mg/dL (ref 0.0–1.2)
CO2: 23 mmol/L (ref 20–29)
Calcium: 10.3 mg/dL (ref 8.7–10.3)
Chloride: 103 mmol/L (ref 96–106)
Creatinine, Ser: 0.81 mg/dL (ref 0.57–1.00)
Globulin, Total: 1.9 g/dL (ref 1.5–4.5)
Glucose: 105 mg/dL — ABNORMAL HIGH (ref 70–99)
Potassium: 3.7 mmol/L (ref 3.5–5.2)
Sodium: 142 mmol/L (ref 134–144)
Total Protein: 6.1 g/dL (ref 6.0–8.5)
eGFR: 75 mL/min/{1.73_m2} (ref >59)

## 2023-03-31 LAB — C-REACTIVE PROTEIN: CRP: 5 mg/L (ref 0–10)

## 2023-03-31 LAB — B12 AND FOLATE PANEL
Folate: 7.2 ng/mL (ref >3.0)
Vitamin B-12: 254 pg/mL (ref 232–1245)

## 2023-03-31 LAB — MITOCHONDRIAL/SMOOTH MUSCLE AB PNL
Mitochondrial Ab: <20 U (ref 0.0–20.0)
Smooth Muscle Ab: 3 U (ref 0–19)

## 2023-03-31 LAB — HEMOGLOBIN A1C
Est. average glucose Bld gHb Est-mCnc: 128 mg/dL
Hgb A1c MFr Bld: 6.1 % — ABNORMAL HIGH (ref 4.8–5.6)

## 2023-03-31 LAB — LIPID PANEL
Chol/HDL Ratio: 2.4 {ratio} (ref 0.0–4.4)
Cholesterol, Total: 160 mg/dL (ref 100–199)
HDL: 66 mg/dL (ref >39)
LDL Chol Calc (NIH): 80 mg/dL (ref 0–99)
Triglycerides: 73 mg/dL (ref 0–149)
VLDL Cholesterol Cal: 14 mg/dL (ref 5–40)

## 2023-03-31 LAB — SEDIMENTATION RATE: Sed Rate: 6 mm/h (ref 0–40)

## 2023-03-31 LAB — METHYLMALONIC ACID, SERUM: Methylmalonic Acid: 120 nmol/L (ref 0–378)

## 2023-04-09 ENCOUNTER — Other Ambulatory Visit: Payer: Self-pay | Admitting: Family Medicine

## 2023-04-11 ENCOUNTER — Ambulatory Visit: Payer: Medicare Other | Attending: Cardiology | Admitting: Cardiology

## 2023-04-11 ENCOUNTER — Encounter: Payer: Self-pay | Admitting: Cardiology

## 2023-04-11 VITALS — BP 108/72 | HR 77 | Ht 63.0 in | Wt 122.8 lb

## 2023-04-11 DIAGNOSIS — E782 Mixed hyperlipidemia: Secondary | ICD-10-CM

## 2023-04-11 DIAGNOSIS — I10 Essential (primary) hypertension: Secondary | ICD-10-CM

## 2023-04-11 DIAGNOSIS — I471 Supraventricular tachycardia, unspecified: Secondary | ICD-10-CM

## 2023-04-11 NOTE — Progress Notes (Signed)
 Cardiology Office Note:    Date:  04/11/2023   ID:  Dana [name], DOB August 31, 1945, MRN [id number]  PCP:  Blane Ohara, MD  Cardiologist:  Gypsy Balsam, MD    Referring MD: Blane Ohara, MD   Chief Complaint  Patient presents with   Follow-up    History of Present Illness:    Dana [name] is a 78 y.o. female past medical history significant for essential hypertension somewhat difficult to control, dyslipidemia, intolerance to statin, hypothyroidism and then recently recognized to have hyperparathyroidism.  She was find to have hypothyroidism, medication has been adjusted she is doing much better.  Denies have any dizziness palpitations that she is correcting go back to her exercises on the regular basis  Past Medical History:  Diagnosis Date   Anxiety disorder 07/19/2019   Chronic back pain 03/10/2018   Depression    Dyslipidemia 07/28/2019   Dyspnea on exertion 03/19/2019   Essential hypertension 03/19/2019   History of ovarian cancer    Hyperlipidemia 04/2015   Hypertension 02/2019   Hypothyroidism 03/10/2018   Impingement syndrome of right shoulder 05/12/2014   Insomnia due to other mental disorder 07/19/2019   Lumbar back pain 07/20/2019   Mixed hyperlipidemia 03/19/2019   Right shoulder pain 05/12/2014   Sciatic nerve pain, left 07/19/2019   Sciatic nerve pain, right 07/19/2019   UTI (urinary tract infection)     Past Surgical History:  Procedure Laterality Date   ABDOMINAL HYSTERECTOMY  1986   Total, also had chemo   APPENDECTOMY  1986   ESOPHAGOGASTRODUODENOSCOPY     years ago   LAPAROTOMY  1987   To rule out recurrent ovarian cancer   TONSILLECTOMY      Current Medications: Current Meds  Medication Sig   acetaZOLAMIDE (DIAMOX) 250 MG tablet Take 1 tablet (250 mg total) by mouth 2 (two) times daily.   ALPRAZolam (XANAX) 0.25 MG tablet TAKE 1 TABLET BY MOUTH AT BEDTIME AS NEEDED FOR ANXIETY. (Patient taking differently: Take 0.25 mg by mouth at  bedtime as needed for anxiety. TAKE 1 TABLET BY MOUTH AT BEDTIME AS NEEDED FOR ANXIETY.)   conjugated estrogens (PREMARIN) vaginal cream Place 1 applicator vaginally as directed. 3 times a week   D-Mannose 500 MG CAPS Take 2 capsules by mouth daily.   ezetimibe (ZETIA) 10 MG tablet Take 1 tablet (10 mg total) by mouth daily.   hydrochlorothiazide (HYDRODIURIL) 25 MG tablet TAKE 1 TABLET BY MOUTH DAILY.   levothyroxine (SYNTHROID) 75 MCG tablet Take 1 tablet (75 mcg total) by mouth daily.   meloxicam (MOBIC) 7.5 MG tablet Take 1 tablet (7.5 mg total) by mouth 2 (two) times daily as needed for pain.   nebivolol (BYSTOLIC) 5 MG tablet Take 1 tablet (5 mg total) by mouth daily.   nitrofurantoin (MACRODANTIN) 100 MG capsule Take 100 mg by mouth 2 (two) times daily.   potassium chloride (KLOR-CON M) 10 MEQ tablet Take 1 tablet (10 mEq total) by mouth daily.   rosuvastatin (CRESTOR) 5 MG tablet TAKE 1 TABLET BY MOUTH 3 TIMES A WEEK (Patient taking differently: Take 5 mg by mouth 3 (three) times a week.)     Allergies:   Lipitor [atorvastatin], Penicillamine, Topamax [topiramate], Penicillins, and Pravastatin   Social History   Socioeconomic History   Marital status: Married    Spouse name: Roe Coombs   Number of children: Not on file   Years of education: Not on file   Highest education level: Bachelor's degree (e.g., BA,  AB, BS)  Occupational History   Not on file  Tobacco Use   Smoking status: Never   Smokeless tobacco: Never  Vaping Use   Vaping status: Never Used  Substance and Sexual Activity   Alcohol use: Not Currently    Alcohol/week: 1.0 standard drink of alcohol    Types: 1 Glasses of wine per week    Comment: occasionally   Drug use: Not Currently    Types: Solvent inhalants   Sexual activity: Yes    Partners: Male  Other Topics Concern   Not on file  Social History Narrative   Are you right handed or left handed? Right   Are you currently employed ? Retired   What is your  current occupation?   Do you live at home alone? N   Who lives with you? Husband   What type of home do you live in: 1 story or 2 story? 1       Social Drivers of Corporate investment banker Strain: Low Risk  (03/23/2023)   Overall Financial Resource Strain (CARDIA)    Difficulty of Paying Living Expenses: Not hard at all  Food Insecurity: No Food Insecurity (03/23/2023)   Hunger Vital Sign    Worried About Running Out of Food in the Last Year: Never true    Ran Out of Food in the Last Year: Never true  Transportation Needs: No Transportation Needs (03/23/2023)   PRAPARE - Administrator, Civil Service (Medical): No    Lack of Transportation (Non-Medical): No  Physical Activity: Inactive (03/23/2023)   Exercise Vital Sign    Days of Exercise per Week: 0 days    Minutes of Exercise per Session: 40 min  Stress: No Stress Concern Present (03/23/2023)   [employer] of Occupational Health - Occupational Stress Questionnaire    Feeling of Stress : Only a little  Social Connections: Socially Integrated (03/23/2023)   Social Connection and Isolation Panel [NHANES]    Frequency of Communication with Friends and Family: More than three times a week    Frequency of Social Gatherings with Friends and Family: Three times a week    Attends Religious Services: More than 4 times per year    Active Member of Clubs or Organizations: Yes    Attends Engineer, structural: More than 4 times per year    Marital Status: Married     Family History: The patient's family history includes Congestive Heart Failure in her mother; Neuropathy in her father; Osteoarthritis in her mother. There is no history of Esophageal cancer or Colon cancer. ROS:   Please see the history of present illness.    All 14 point review of systems negative except as described per history of present illness  EKGs/Labs/Other Studies Reviewed:         Recent Labs: 03/14/2023: TSH 0.08 03/27/2023: ALT 10;  BUN 19; Creatinine, Ser 0.81; Hemoglobin 14.3; Platelets 373; Potassium 3.7; Sodium 142  Recent Lipid Panel    Component Value Date/Time   CHOL 160 03/27/2023 0850   TRIG 73 03/27/2023 0850   HDL 66 03/27/2023 0850   CHOLHDL 2.4 03/27/2023 0850   LDLCALC 80 03/27/2023 0850    Physical Exam:    VS:  BP 108/72 (BP Location: Left Arm, Patient Position: Sitting)   Pulse 77   Ht 5\' 3"  (1.6 m)   Wt 122 lb 12.8 oz (55.7 kg)   LMP  (LMP Unknown)   SpO2 96%   BMI  21.75 kg/m     Wt Readings from Last 3 Encounters:  04/11/23 122 lb 12.8 oz (55.7 kg)  03/27/23 124 lb (56.2 kg)  03/14/23 125 lb 4 oz (56.8 kg)     GEN:  Well nourished, well developed in no acute distress HEENT: Normal NECK: No JVD; No carotid bruits LYMPHATICS: No lymphadenopathy CARDIAC: RRR, no murmurs, no rubs, no gallops RESPIRATORY:  Clear to auscultation without rales, wheezing or rhonchi  ABDOMEN: Soft, non-tender, non-distended MUSCULOSKELETAL:  No edema; No deformity  SKIN: Warm and dry LOWER EXTREMITIES: no swelling NEUROLOGIC:  Alert and oriented x 3 PSYCHIATRIC:  Normal affect   ASSESSMENT:    1. Supraventricular tachycardia (HCC)   2. Essential hypertension   3. Mixed hyperlipidemia    PLAN:    In order of problems listed above:  Supraventricular tachycardia denies have any palpitations small dose of beta-blocker which I will continue. Essential hypertension blood pressure well-controlled continue present management. Mixed dyslipidemia will wait for thyroid to be stabilized before cholesterol be rechecked and managed   Medication Adjustments/Labs and Tests Ordered: Current medicines are reviewed at length with the patient today.  Concerns regarding medicines are outlined above.  No orders of the defined types were placed in this encounter.  Medication changes: No orders of the defined types were placed in this encounter.   Signed, Georgeanna Lea, MD, [facility] 04/11/2023 3:01 PM    [facility]

## 2023-04-11 NOTE — Patient Instructions (Signed)

## 2023-04-18 ENCOUNTER — Other Ambulatory Visit: Payer: Self-pay | Admitting: Family Medicine

## 2023-04-29 ENCOUNTER — Other Ambulatory Visit: Payer: Medicare Other

## 2023-04-30 ENCOUNTER — Ambulatory Visit: Payer: Medicare Other | Admitting: Neurology

## 2023-04-30 NOTE — Progress Notes (Unsigned)
 Subjective:  Patient ID: Dana [name], female    DOB: 1945/10/01  Age: 78 y.o. MRN: [id number]  Chief Complaint  Patient presents with   Medical Management of Chronic Issues   Discussed the use of AI scribe software for clinical note transcription with the patient, who gave verbal consent to proceed.  Hyperlipidemia: Current medications: Zetia 10mg  taking one tablet daily, Rosuvastatin 5 mg take 3 times a week. Tolerating well.   Hypertension: HCTZ 25mg  take 1 tablet daily, Nebivolol HCL 5 mg one tablet daily. Bp is great today. Eat healthy. Exercise: too tired.  Hypothyroidism: Current medications: Levothyroxine 75 mcg take 1 tablet daily.  Anxiety: Current medications: Xanax 0.25mg  take 1/2 tablet daily at night because patient wants to come off of it. Taking melatonin for sleep which helps.      05/01/2023    9:53 AM 01/29/2023    9:29 AM 09/24/2022    8:14 AM 09/24/2022    8:12 AM 09/19/2021    1:26 PM  Depression screen PHQ 2/9  Decreased Interest 0 0 0 0 0  Down, Depressed, Hopeless 0 0 0 0 0  PHQ - 2 Score 0 0 0 0 0  Altered sleeping 1 2 1     Tired, decreased energy 3 3 0    Change in appetite 1 3 0    Feeling bad or failure about yourself  0 0 0    Trouble concentrating 0 2 1    Moving slowly or fidgety/restless 0 3 0    Suicidal thoughts 0 0 0    PHQ-9 Score 5 13 2     Difficult doing work/chores Somewhat difficult Not difficult at all Not difficult at all          01/29/2023    9:29 AM  Fall Risk   Falls in the past year? 0  Number falls in past yr: 0  Injury with Fall? 0  Risk for fall due to : No Fall Risks  Follow up Falls evaluation completed    Patient Care Team: Blane Ohara, MD as PCP - General (Family Medicine) Georgeanna Lea, MD as Consulting Physician (Cardiology) Corrin Parker. (Inactive) as Referring Physician (Obstetrics and Gynecology) Mateo Flow, MD as Consulting Physician (Ophthalmology) Drema Dallas, DO as Consulting  Physician (Neurology)   Review of Systems  Constitutional:  Positive for fatigue. Negative for chills and fever.       Cold all the time.   HENT:  Negative for congestion, ear pain, rhinorrhea and sore throat.   Respiratory:  Negative for cough and shortness of breath.   Cardiovascular:  Negative for chest pain.  Gastrointestinal:  Positive for abdominal pain (ruq. saw GI.). Negative for constipation, diarrhea, nausea and vomiting.  Genitourinary:  Negative for dysuria and urgency.  Musculoskeletal:  Negative for back pain and myalgias.  Neurological:  Negative for dizziness, weakness, light-headedness and headaches.  Psychiatric/Behavioral:  Negative for dysphoric mood. The patient is not nervous/anxious.     Current Outpatient Medications on File Prior to Visit  Medication Sig Dispense Refill   acetaZOLAMIDE (DIAMOX) 250 MG tablet Take 1 tablet (250 mg total) by mouth 2 (two) times daily. 60 tablet 5   ALPRAZolam (XANAX) 0.25 MG tablet TAKE 1 TABLET BY MOUTH AT BEDTIME AS NEEDED FOR ANXIETY. (Patient taking differently: Take 0.25 mg by mouth at bedtime as needed for anxiety. TAKE 1 TABLET BY MOUTH AT BEDTIME AS NEEDED FOR ANXIETY.) 30 tablet 5   conjugated estrogens (PREMARIN)  vaginal cream Place 1 applicator vaginally as directed. 3 times a week     D-Mannose 500 MG CAPS Take 2 capsules by mouth daily.     ezetimibe (ZETIA) 10 MG tablet Take 1 tablet (10 mg total) by mouth daily. 90 tablet 3   hydrochlorothiazide (HYDRODIURIL) 25 MG tablet TAKE 1 TABLET BY MOUTH DAILY. 90 tablet 0   meloxicam (MOBIC) 7.5 MG tablet Take 1 tablet (7.5 mg total) by mouth 2 (two) times daily as needed for pain. 60 tablet 1   nebivolol (BYSTOLIC) 5 MG tablet Take 1 tablet (5 mg total) by mouth daily. 30 tablet 11   rosuvastatin (CRESTOR) 5 MG tablet TAKE 1 TABLET BY MOUTH 3 TIMES A WEEK (Patient taking differently: Take 5 mg by mouth 3 (three) times a week.) 36 tablet 2   No current facility-administered  medications on file prior to visit.   Past Medical History:  Diagnosis Date   Anxiety disorder 07/19/2019   Chronic back pain 03/10/2018   Depression    Dyslipidemia 07/28/2019   Dyspnea on exertion 03/19/2019   Essential hypertension 03/19/2019   History of ovarian cancer    Hyperlipidemia 04/2015   Hypertension 02/2019   Hypothyroidism 03/10/2018   Impingement syndrome of right shoulder 05/12/2014   Insomnia due to other mental disorder 07/19/2019   Lumbar back pain 07/20/2019   Mixed hyperlipidemia 03/19/2019   Right shoulder pain 05/12/2014   Sciatic nerve pain, left 07/19/2019   Sciatic nerve pain, right 07/19/2019   UTI (urinary tract infection)    Past Surgical History:  Procedure Laterality Date   ABDOMINAL HYSTERECTOMY  1986   Total, also had chemo   APPENDECTOMY  1986   ESOPHAGOGASTRODUODENOSCOPY     years ago   LAPAROTOMY  1987   To rule out recurrent ovarian cancer   TONSILLECTOMY      Family History  Problem Relation Age of Onset   Congestive Heart Failure Mother    Osteoarthritis Mother    Neuropathy Father    Esophageal cancer Neg Hx    Colon cancer Neg Hx    Social History   Socioeconomic History   Marital status: Married    Spouse name: Roe Coombs   Number of children: Not on file   Years of education: Not on file   Highest education level: Bachelor's degree (e.g., BA, AB, BS)  Occupational History   Not on file  Tobacco Use   Smoking status: Never   Smokeless tobacco: Never  Vaping Use   Vaping status: Never Used  Substance and Sexual Activity   Alcohol use: Not Currently    Alcohol/week: 1.0 standard drink of alcohol    Types: 1 Glasses of wine per week    Comment: occasionally   Drug use: Not Currently    Types: Solvent inhalants   Sexual activity: Yes    Partners: Male  Other Topics Concern   Not on file  Social History Narrative   Are you right handed or left handed? Right   Are you currently employed ? Retired   What is your  current occupation?   Do you live at home alone? N   Who lives with you? Husband   What type of home do you live in: 1 story or 2 story? 1       Social Drivers of Corporate investment banker Strain: Low Risk  (03/23/2023)   Overall Financial Resource Strain (CARDIA)    Difficulty of Paying Living Expenses: Not hard at  all  Food Insecurity: No Food Insecurity (03/23/2023)   Hunger Vital Sign    Worried About Running Out of Food in the Last Year: Never true    Ran Out of Food in the Last Year: Never true  Transportation Needs: No Transportation Needs (03/23/2023)   PRAPARE - Administrator, Civil Service (Medical): No    Lack of Transportation (Non-Medical): No  Physical Activity: Inactive (03/23/2023)   Exercise Vital Sign    Days of Exercise per Week: 0 days    Minutes of Exercise per Session: 40 min  Stress: No Stress Concern Present (03/23/2023)   [employer] of Occupational Health - Occupational Stress Questionnaire    Feeling of Stress : Only a little  Social Connections: Socially Integrated (03/23/2023)   Social Connection and Isolation Panel [NHANES]    Frequency of Communication with Friends and Family: More than three times a week    Frequency of Social Gatherings with Friends and Family: Three times a week    Attends Religious Services: More than 4 times per year    Active Member of Clubs or Organizations: Yes    Attends Engineer, structural: More than 4 times per year    Marital Status: Married    Objective:  BP 126/82   Pulse (!) 57   Temp 97.8 F (36.6 C)   Ht 5\' 3"  (1.6 m)   Wt 120 lb (54.4 kg)   LMP  (LMP Unknown)   SpO2 99%   BMI 21.26 kg/m      05/01/2023    9:50 AM 04/11/2023    2:17 PM 03/27/2023    8:01 AM  BP/Weight  Systolic BP 126 108 126  Diastolic BP 82 72 72  Wt. (Lbs) 120 122.8 124  BMI 21.26 kg/m2 21.75 kg/m2 21.97 kg/m2    Physical Exam Vitals reviewed.  Constitutional:      Appearance: Normal appearance. She  is normal weight.  Cardiovascular:     Rate and Rhythm: Normal rate and regular rhythm.     Heart sounds: Normal heart sounds.  Pulmonary:     Effort: Pulmonary effort is normal. No respiratory distress.     Breath sounds: Normal breath sounds.  Abdominal:     General: Abdomen is flat. Bowel sounds are normal.     Palpations: Abdomen is soft.     Tenderness: There is no abdominal tenderness.  Neurological:     Mental Status: She is alert and oriented to person, place, and time.  Psychiatric:        Mood and Affect: Mood normal.        Behavior: Behavior normal.     Diabetic Foot Exam - Simple   No data filed      Lab Results  Component Value Date   WBC 6.8 05/01/2023   HGB 14.2 05/01/2023   HCT 43.1 05/01/2023   PLT 396 05/01/2023   GLUCOSE 111 (H) 05/01/2023   CHOL 162 05/01/2023   TRIG 71 05/01/2023   HDL 70 05/01/2023   LDLCALC 78 05/01/2023   ALT 10 05/01/2023   AST 14 05/01/2023   NA 141 05/01/2023   K 3.0 (L) 05/01/2023   CL 103 05/01/2023   CREATININE 0.76 05/01/2023   BUN 17 05/01/2023   CO2 25 05/01/2023   TSH 0.245 (L) 05/01/2023   HGBA1C 6.1 (H) 05/01/2023      Assessment & Plan:   Essential hypertension Assessment & Plan: Well controlled.  No changes to  medicines. Continue HCTZ 25mg  take 1 tablet daily, Nebivolol HCL 5 mg takes 1 tablet dail Continue to work on eating a healthy diet and exercise.  Labs drawn today.    Orders: -     CBC with Differential/Platelet -     Comprehensive metabolic panel  Supraventricular tachycardia (HCC) Assessment & Plan: Management per specialist.   Hypothyroidism due to acquired atrophy of thyroid Assessment & Plan: Previously well controlled Continue Synthroid at current dose  Recheck TSH and adjust Synthroid as indicated    Orders: -     T4, free -     TSH  Mixed hyperlipidemia Assessment & Plan: Well controlled.  No changes to medicines. Continue Zetia 10mg  taking one tablet daily,  Rosuvastatin 5 mg take 3 times a week. Continue to work on eating a healthy diet and exercise.  Labs drawn today.    Orders: -     Lipid panel  Prediabetes Assessment & Plan: Continue to work on healthy diet and exercise.    Orders: -     Hemoglobin A1c     No orders of the defined types were placed in this encounter.   Orders Placed This Encounter  Procedures   CBC with Differential/Platelet   Comprehensive metabolic panel   Hemoglobin A1c   Lipid panel   T4, free   TSH     Follow-up: Return in about 3 months (around 08/04/2023).   I,Marla I Leal-Borjas,acting as a scribe for Blane Ohara, MD.,have documented all relevant documentation on the behalf of Blane Ohara, MD,as directed by  Blane Ohara, MD while in the presence of Blane Ohara, MD.   An After Visit Summary was printed and given to the patient.  I attest that I have reviewed this visit and agree with the plan scribed by my staff.   Blane Ohara, MD [facility] [phone]

## 2023-05-01 ENCOUNTER — Encounter: Payer: Self-pay | Admitting: Family Medicine

## 2023-05-01 ENCOUNTER — Ambulatory Visit: Payer: Medicare Other | Admitting: Family Medicine

## 2023-05-01 VITALS — BP 126/82 | HR 57 | Temp 97.8°F | Ht 63.0 in | Wt 120.0 lb

## 2023-05-01 DIAGNOSIS — I471 Supraventricular tachycardia, unspecified: Secondary | ICD-10-CM | POA: Diagnosis not present

## 2023-05-01 DIAGNOSIS — R7303 Prediabetes: Secondary | ICD-10-CM | POA: Diagnosis not present

## 2023-05-01 DIAGNOSIS — E034 Atrophy of thyroid (acquired): Secondary | ICD-10-CM | POA: Diagnosis not present

## 2023-05-01 DIAGNOSIS — E782 Mixed hyperlipidemia: Secondary | ICD-10-CM

## 2023-05-01 DIAGNOSIS — I10 Essential (primary) hypertension: Secondary | ICD-10-CM

## 2023-05-02 ENCOUNTER — Encounter: Payer: Self-pay | Admitting: Family Medicine

## 2023-05-02 ENCOUNTER — Other Ambulatory Visit: Payer: Self-pay | Admitting: Family Medicine

## 2023-05-02 ENCOUNTER — Other Ambulatory Visit: Payer: Self-pay

## 2023-05-02 DIAGNOSIS — E034 Atrophy of thyroid (acquired): Secondary | ICD-10-CM

## 2023-05-02 LAB — COMPREHENSIVE METABOLIC PANEL
ALT: 10 IU/L (ref 0–32)
AST: 14 IU/L (ref 0–40)
Albumin: 4.1 g/dL (ref 3.8–4.8)
Alkaline Phosphatase: 62 IU/L (ref 44–121)
BUN/Creatinine Ratio: 22 (ref 12–28)
BUN: 17 mg/dL (ref 8–27)
Bilirubin Total: 0.6 mg/dL (ref 0.0–1.2)
CO2: 25 mmol/L (ref 20–29)
Calcium: 10.2 mg/dL (ref 8.7–10.3)
Chloride: 103 mmol/L (ref 96–106)
Creatinine, Ser: 0.76 mg/dL (ref 0.57–1.00)
Globulin, Total: 1.9 g/dL (ref 1.5–4.5)
Glucose: 111 mg/dL — ABNORMAL HIGH (ref 70–99)
Potassium: 3 mmol/L — ABNORMAL LOW (ref 3.5–5.2)
Sodium: 141 mmol/L (ref 134–144)
Total Protein: 6 g/dL (ref 6.0–8.5)
eGFR: 81 mL/min/{1.73_m2} (ref >59)

## 2023-05-02 LAB — CBC WITH DIFFERENTIAL/PLATELET
Basophils Absolute: 0 10*3/uL (ref 0.0–0.2)
Basos: 1 %
EOS (ABSOLUTE): 0.1 10*3/uL (ref 0.0–0.4)
Eos: 1 %
Hematocrit: 43.1 % (ref 34.0–46.6)
Hemoglobin: 14.2 g/dL (ref 11.1–15.9)
Immature Grans (Abs): 0 10*3/uL (ref 0.0–0.1)
Immature Granulocytes: 0 %
Lymphocytes Absolute: 1.9 10*3/uL (ref 0.7–3.1)
Lymphs: 28 %
MCH: 30.5 pg (ref 26.6–33.0)
MCHC: 32.9 g/dL (ref 31.5–35.7)
MCV: 93 fL (ref 79–97)
Monocytes Absolute: 0.4 10*3/uL (ref 0.1–0.9)
Monocytes: 6 %
Neutrophils Absolute: 4.4 10*3/uL (ref 1.4–7.0)
Neutrophils: 64 %
Platelets: 396 10*3/uL (ref 150–450)
RBC: 4.66 x10E6/uL (ref 3.77–5.28)
RDW: 13.1 % (ref 11.7–15.4)
WBC: 6.8 10*3/uL (ref 3.4–10.8)

## 2023-05-02 LAB — LIPID PANEL
Chol/HDL Ratio: 2.3 ratio (ref 0.0–4.4)
Cholesterol, Total: 162 mg/dL (ref 100–199)
HDL: 70 mg/dL (ref >39)
LDL Chol Calc (NIH): 78 mg/dL (ref 0–99)
Triglycerides: 71 mg/dL (ref 0–149)
VLDL Cholesterol Cal: 14 mg/dL (ref 5–40)

## 2023-05-02 LAB — T4, FREE: Free T4: 1.54 ng/dL (ref 0.82–1.77)

## 2023-05-02 LAB — TSH: TSH: 0.245 u[IU]/mL — ABNORMAL LOW (ref 0.450–4.500)

## 2023-05-02 LAB — HEMOGLOBIN A1C
Est. average glucose Bld gHb Est-mCnc: 128 mg/dL
Hgb A1c MFr Bld: 6.1 % — ABNORMAL HIGH (ref 4.8–5.6)

## 2023-05-02 MED ORDER — POTASSIUM CHLORIDE CRYS ER 20 MEQ PO TBCR: 20.0000 meq | EXTENDED_RELEASE_TABLET | Freq: Every day | ORAL | 1 refills | Status: DC

## 2023-05-02 MED ORDER — LEVOTHYROXINE SODIUM 50 MCG PO TABS: 50.0000 ug | ORAL_TABLET | Freq: Every day | ORAL | 0 refills | Status: DC

## 2023-05-03 NOTE — Assessment & Plan Note (Signed)
Well controlled.  No changes to medicines. Continue HCTZ 25mg  take 1 tablet daily, Nebivolol HCL 5 mg takes 1 tablet dail Continue to work on eating a healthy diet and exercise.  Labs drawn today.

## 2023-05-03 NOTE — Assessment & Plan Note (Signed)
 Management per specialist.

## 2023-05-03 NOTE — Assessment & Plan Note (Signed)
 Previously well controlled Continue Synthroid at current dose  Recheck TSH and adjust Synthroid as indicated

## 2023-05-03 NOTE — Assessment & Plan Note (Signed)
Well controlled.  No changes to medicines. Continue Zetia 10mg  taking one tablet daily, Rosuvastatin 5 mg take 3 times a week. Continue to work on eating a healthy diet and exercise.  Labs drawn today.

## 2023-05-03 NOTE — Assessment & Plan Note (Signed)
Continue to work on healthy diet and exercise.   

## 2023-05-05 ENCOUNTER — Other Ambulatory Visit: Payer: Self-pay

## 2023-05-05 ENCOUNTER — Encounter: Payer: Self-pay | Admitting: Family Medicine

## 2023-05-05 ENCOUNTER — Other Ambulatory Visit: Payer: Self-pay | Admitting: Family Medicine

## 2023-05-05 ENCOUNTER — Telehealth: Payer: Self-pay

## 2023-05-05 DIAGNOSIS — Z8639 Personal history of other endocrine, nutritional and metabolic disease: Secondary | ICD-10-CM

## 2023-05-05 DIAGNOSIS — E034 Atrophy of thyroid (acquired): Secondary | ICD-10-CM

## 2023-05-05 MED ORDER — POTASSIUM CHLORIDE CRYS ER 20 MEQ PO TBCR: 40.0000 meq | EXTENDED_RELEASE_TABLET | Freq: Every day | ORAL | 1 refills | Status: DC

## 2023-05-05 NOTE — Telephone Encounter (Signed)
 Patient called and asking is there anyway we can do a referral to get her to see someone for her thyroid levels? She states it is interfering with day to day life and she would like more test and answers on why it goes from hypothyroidism to hyperthyroidism. Can we do an referral?

## 2023-05-26 DIAGNOSIS — K080 Exfoliation of teeth due to systemic causes: Secondary | ICD-10-CM | POA: Diagnosis not present

## 2023-06-04 ENCOUNTER — Other Ambulatory Visit: Payer: Self-pay | Admitting: Family Medicine

## 2023-06-04 ENCOUNTER — Other Ambulatory Visit: Payer: Self-pay

## 2023-06-04 DIAGNOSIS — F419 Anxiety disorder, unspecified: Secondary | ICD-10-CM

## 2023-06-04 DIAGNOSIS — E782 Mixed hyperlipidemia: Secondary | ICD-10-CM

## 2023-06-04 MED ORDER — ALPRAZOLAM 0.25 MG PO TABS: ORAL_TABLET | ORAL | 4 refills | Status: DC

## 2023-06-04 NOTE — Progress Notes (Unsigned)
 NEUROLOGY FOLLOW UP OFFICE NOTE  Dana [name] [id number]  Assessment/Plan:   Primary cough headache  Acetazolamide 250mg  once daily May use Excedrin Migraine generic as needed.  Limit use of pain relievers to no more than 2 days out of week to prevent risk of rebound or medication-overuse headache. Follow up 6 months.   Subjective:  Dana [name] is a 78 year old right-handed female with HTN, hypothyroidism, mitral valve regurgitation, HLD, anxiety, chronic low back pain and history of ovarian cancer who follows up for primary cough headache.  CTA head personally reviewed.  UPDATE: CTA Head on 01/03/2023 revealed minimal stenosis at the right MCA bifurcation and ordinary mild atherosclerotic calcification of the carotid siphon regions but otherwise no evidence of aneurysm, LVO, significant stenosis or vascular malformation.    She was started on acetazolamide 250mg  twice daily.  She has been only taking one a day and it is effective.   She hasn't had any headaches.  Able to sneeze and cough without headaches.    05/01/2023 CMP:  Na 141, K 3, Cl 103, CO2 25, BUN 17, Cr 0.76 - started on K+ supplement  HISTORY: In 2022, she developed a new headache.  Whenever she would cough, sneeze or bend over, she would experience a severe sharp pain in the left frontal/periorbital region radiating up to the left side of her scalp.  It would last about 20 minutes and subside.  Some associated photophobia but no neck pain, nausea, vomiting, phonophobia, dizziness or visual disturbance. She developed a respiratory infection at that time, so the headaches were frequent because she was frequently coughing.  MRI of brain with and without contrast on 03/22/2021 personally reviewed was unremarkable except for few nonspecific T2 hyperintense punctate foci within the cerebral white matter (not unusual for age) and single chronic microhemorrhage in the right centrum semiovale but no evidence of Chiari  malformation or mass lesion.  She had a frequent cough for about a couple of months and then it subsided.  Whenever she would cough since then, it would cause the headache but it only occurred once in awhile.  She recently developed a new nagging cough and is now experiencing the same headaches again.  She has been taking generic Excedrin Migraine as needed, which has helped.  She has history of ocular migraines without headache but no prior history of headaches.  PAST MEDICAL HISTORY: Past Medical History:  Diagnosis Date   Anxiety disorder 07/19/2019   Chronic back pain 03/10/2018   Depression    Dyslipidemia 07/28/2019   Dyspnea on exertion 03/19/2019   Essential hypertension 03/19/2019   History of ovarian cancer    Hyperlipidemia 04/2015   Hypertension 02/2019   Hypothyroidism 03/10/2018   Impingement syndrome of right shoulder 05/12/2014   Insomnia due to other mental disorder 07/19/2019   Lumbar back pain 07/20/2019   Mixed hyperlipidemia 03/19/2019   Right shoulder pain 05/12/2014   Sciatic nerve pain, left 07/19/2019   Sciatic nerve pain, right 07/19/2019   UTI (urinary tract infection)     MEDICATIONS: Current Outpatient Medications on File Prior to Visit  Medication Sig Dispense Refill   acetaZOLAMIDE (DIAMOX) 250 MG tablet Take 1 tablet (250 mg total) by mouth 2 (two) times daily. 60 tablet 5   ALPRAZolam (XANAX) 0.25 MG tablet TAKE 1 TABLET BY MOUTH AT BEDTIME AS NEEDED FOR ANXIETY. (Patient taking differently: Take 0.25 mg by mouth at bedtime as needed for anxiety. TAKE 1 TABLET BY MOUTH AT BEDTIME  AS NEEDED FOR ANXIETY.) 30 tablet 5   conjugated estrogens (PREMARIN) vaginal cream Place 1 applicator vaginally as directed. 3 times a week     D-Mannose 500 MG CAPS Take 2 capsules by mouth daily.     ezetimibe (ZETIA) 10 MG tablet Take 1 tablet (10 mg total) by mouth daily. 90 tablet 3   hydrochlorothiazide (HYDRODIURIL) 25 MG tablet TAKE 1 TABLET BY MOUTH DAILY. 90  tablet 0   levothyroxine (SYNTHROID) 50 MCG tablet Take 1 tablet (50 mcg total) by mouth daily. 90 tablet 0   meloxicam (MOBIC) 7.5 MG tablet Take 1 tablet (7.5 mg total) by mouth 2 (two) times daily as needed for pain. 60 tablet 1   nebivolol (BYSTOLIC) 5 MG tablet Take 1 tablet (5 mg total) by mouth daily. 30 tablet 11   potassium chloride SA (KLOR-CON M) 20 MEQ tablet Take 2 tablets (40 mEq total) by mouth daily. 60 tablet 1   rosuvastatin (CRESTOR) 5 MG tablet TAKE 1 TABLET BY MOUTH 3 TIMES A WEEK (Patient taking differently: Take 5 mg by mouth 3 (three) times a week.) 36 tablet 2   No current facility-administered medications on file prior to visit.    ALLERGIES: Allergies  Allergen Reactions   Lipitor [Atorvastatin]     myalgia   Penicillamine     Other reaction(s): Not available   Topamax [Topiramate] Other (See Comments)    BP decreased   Penicillins Rash    Other reaction(s): RASH    Pravastatin Other (See Comments)    Myalgia    FAMILY HISTORY: Family History  Problem Relation Age of Onset   Congestive Heart Failure Mother    Osteoarthritis Mother    Neuropathy Father    Esophageal cancer Neg Hx    Colon cancer Neg Hx       Objective:  Blood pressure 129/72, pulse (!) 59, height 5\' 3"  (1.6 m), weight 120 lb (54.4 kg), SpO2 99%. General: No acute distress.  Patient appears well-groomed.      Shon Millet, DO  CC: Blane Ohara, MD

## 2023-06-05 ENCOUNTER — Encounter: Payer: Self-pay | Admitting: Neurology

## 2023-06-05 ENCOUNTER — Ambulatory Visit: Payer: Medicare Other | Admitting: Neurology

## 2023-06-05 VITALS — BP 129/72 | HR 59 | Ht 63.0 in | Wt 120.0 lb

## 2023-06-05 DIAGNOSIS — G4483 Primary cough headache: Secondary | ICD-10-CM

## 2023-06-05 MED ORDER — ACETAZOLAMIDE 250 MG PO TABS: 250.0000 mg | ORAL_TABLET | Freq: Every day | ORAL | 5 refills | Status: DC

## 2023-06-05 NOTE — Patient Instructions (Signed)
 Acetazolamide 250mg  daily

## 2023-06-16 ENCOUNTER — Ambulatory Visit

## 2023-06-16 ENCOUNTER — Other Ambulatory Visit

## 2023-06-16 DIAGNOSIS — R10811 Right upper quadrant abdominal tenderness: Secondary | ICD-10-CM

## 2023-06-16 DIAGNOSIS — E034 Atrophy of thyroid (acquired): Secondary | ICD-10-CM

## 2023-06-16 DIAGNOSIS — Z8639 Personal history of other endocrine, nutritional and metabolic disease: Secondary | ICD-10-CM | POA: Diagnosis not present

## 2023-06-17 ENCOUNTER — Encounter: Payer: Self-pay | Admitting: Family Medicine

## 2023-06-17 ENCOUNTER — Ambulatory Visit: Admitting: Physician Assistant

## 2023-06-17 LAB — COMPREHENSIVE METABOLIC PANEL WITH GFR
ALT: 8 IU/L (ref 0–32)
AST: 15 IU/L (ref 0–40)
Albumin: 4.1 g/dL (ref 3.8–4.8)
Alkaline Phosphatase: 53 IU/L (ref 44–121)
BUN/Creatinine Ratio: 19 (ref 12–28)
BUN: 15 mg/dL (ref 8–27)
Bilirubin Total: 0.6 mg/dL (ref 0.0–1.2)
CO2: 23 mmol/L (ref 20–29)
Calcium: 9.8 mg/dL (ref 8.7–10.3)
Chloride: 104 mmol/L (ref 96–106)
Creatinine, Ser: 0.8 mg/dL (ref 0.57–1.00)
Globulin, Total: 1.8 g/dL (ref 1.5–4.5)
Glucose: 109 mg/dL — ABNORMAL HIGH (ref 70–99)
Potassium: 3.4 mmol/L — ABNORMAL LOW (ref 3.5–5.2)
Sodium: 143 mmol/L (ref 134–144)
Total Protein: 5.9 g/dL — ABNORMAL LOW (ref 6.0–8.5)
eGFR: 76 mL/min/{1.73_m2} (ref >59)

## 2023-06-17 LAB — T4, FREE: Free T4: 1.48 ng/dL (ref 0.82–1.77)

## 2023-06-17 LAB — TSH: TSH: 2.54 u[IU]/mL (ref 0.450–4.500)

## 2023-06-18 ENCOUNTER — Ambulatory Visit: Admitting: Family Medicine

## 2023-06-18 ENCOUNTER — Encounter: Payer: Self-pay | Admitting: Family Medicine

## 2023-06-18 VITALS — BP 128/76 | HR 63 | Temp 98.6°F | Ht 63.0 in | Wt 118.0 lb

## 2023-06-18 DIAGNOSIS — E034 Atrophy of thyroid (acquired): Secondary | ICD-10-CM

## 2023-06-18 DIAGNOSIS — B353 Tinea pedis: Secondary | ICD-10-CM

## 2023-06-18 MED ORDER — LEVOTHYROXINE SODIUM 50 MCG PO TABS: 50.0000 ug | ORAL_TABLET | Freq: Every day | ORAL | 2 refills | Status: DC

## 2023-06-18 MED ORDER — KETOCONAZOLE 2 % EX CREA: 1.0000 | TOPICAL_CREAM | Freq: Two times a day (BID) | CUTANEOUS | 0 refills | Status: AC

## 2023-06-18 NOTE — Progress Notes (Signed)
 Acute Office Visit  Subjective:    Patient ID: Dana [name], female    DOB: Mar 01, 1945, 78 y.o.   MRN: [id number]  Chief Complaint  Patient presents with   Foot Problem    HPI: Patient is in today for lesion in between right 4th-5th toe x 1 month. Patient states at times it oozes, has been applying neosporin to the area. At times lateral foot has pain due to this area. Wearing enclosed shoes worsens pain.  Past Medical History:  Diagnosis Date   Anxiety disorder 07/19/2019   Chronic back pain 03/10/2018   Depression    Dyslipidemia 07/28/2019   Dyspnea on exertion 03/19/2019   Essential hypertension 03/19/2019   History of ovarian cancer    Hyperlipidemia 04/2015   Hypertension 02/2019   Hypothyroidism 03/10/2018   Impingement syndrome of right shoulder 05/12/2014   Insomnia due to other mental disorder 07/19/2019   Lumbar back pain 07/20/2019   Mixed hyperlipidemia 03/19/2019   Right shoulder pain 05/12/2014   Sciatic nerve pain, left 07/19/2019   Sciatic nerve pain, right 07/19/2019   UTI (urinary tract infection)     Past Surgical History:  Procedure Laterality Date   ABDOMINAL HYSTERECTOMY  1986   Total, also had chemo   APPENDECTOMY  1986   ESOPHAGOGASTRODUODENOSCOPY     years ago   LAPAROTOMY  1987   To rule out recurrent ovarian cancer   TONSILLECTOMY      Family History  Problem Relation Age of Onset   Congestive Heart Failure Mother    Osteoarthritis Mother    Neuropathy Father    Esophageal cancer Neg Hx    Colon cancer Neg Hx     Social History   Socioeconomic History   Marital status: Married    Spouse name: Jonell Neptune   Number of children: Not on file   Years of education: Not on file   Highest education level: Bachelor's degree (e.g., BA, AB, BS)  Occupational History   Not on file  Tobacco Use   Smoking status: Never   Smokeless tobacco: Never  Vaping Use   Vaping status: Never Used  Substance and Sexual Activity   Alcohol use: Not  Currently    Alcohol/week: 1.0 standard drink of alcohol    Types: 1 Glasses of wine per week    Comment: occasionally   Drug use: Not Currently    Types: Solvent inhalants   Sexual activity: Yes    Partners: Male  Other Topics Concern   Not on file  Social History Narrative   Are you right handed or left handed? Right   Are you currently employed ? Retired   What is your current occupation?   Do you live at home alone? N   Who lives with you? Husband   What type of home do you live in: 1 story or 2 story? 1       Social Drivers of Corporate investment banker Strain: Low Risk  (03/23/2023)   Overall Financial Resource Strain (CARDIA)    Difficulty of Paying Living Expenses: Not hard at all  Food Insecurity: No Food Insecurity (03/23/2023)   Hunger Vital Sign    Worried About Running Out of Food in the Last Year: Never true    Ran Out of Food in the Last Year: Never true  Transportation Needs: No Transportation Needs (03/23/2023)   PRAPARE - Administrator, Civil Service (Medical): No    Lack of Transportation (Non-Medical):  No  Physical Activity: Inactive (03/23/2023)   Exercise Vital Sign    Days of Exercise per Week: 0 days    Minutes of Exercise per Session: 40 min  Stress: No Stress Concern Present (03/23/2023)   [employer] of Occupational Health - Occupational Stress Questionnaire    Feeling of Stress : Only a little  Social Connections: Socially Integrated (03/23/2023)   Social Connection and Isolation Panel [NHANES]    Frequency of Communication with Friends and Family: More than three times a week    Frequency of Social Gatherings with Friends and Family: Three times a week    Attends Religious Services: More than 4 times per year    Active Member of Clubs or Organizations: Yes    Attends Banker Meetings: More than 4 times per year    Marital Status: Married  Catering manager Violence: Not At Risk (09/24/2022)   Humiliation, Afraid,  Rape, and Kick questionnaire    Fear of Current or Ex-Partner: No    Emotionally Abused: No    Physically Abused: No    Sexually Abused: No    Outpatient Medications Prior to Visit  Medication Sig Dispense Refill   acetaZOLAMIDE  (DIAMOX ) 250 MG tablet Take 1 tablet (250 mg total) by mouth daily. 30 tablet 5   ALPRAZolam  (XANAX ) 0.25 MG tablet TAKE 1 TABLET BY MOUTH AT BEDTIME AS NEEDED FOR ANXIETY. 30 tablet 4   conjugated estrogens  (PREMARIN ) vaginal cream Place 1 applicator vaginally as directed. 3 times a week     D-Mannose 500 MG CAPS Take 2 capsules by mouth daily.     ezetimibe  (ZETIA ) 10 MG tablet TAKE 1 TABLET BY MOUTH DAILY. 90 tablet 2   hydrochlorothiazide  (HYDRODIURIL ) 25 MG tablet TAKE 1 TABLET BY MOUTH DAILY. 90 tablet 0   meloxicam  (MOBIC ) 7.5 MG tablet Take 1 tablet (7.5 mg total) by mouth 2 (two) times daily as needed for pain. (Patient not taking: Reported on 06/05/2023) 60 tablet 1   nebivolol  (BYSTOLIC ) 5 MG tablet Take 1 tablet (5 mg total) by mouth daily. 30 tablet 11   potassium chloride  SA (KLOR-CON  M) 20 MEQ tablet Take 2 tablets (40 mEq total) by mouth daily. 60 tablet 1   rosuvastatin  (CRESTOR ) 5 MG tablet TAKE 1 TABLET BY MOUTH 3 TIMES A WEEK (Patient taking differently: Take 5 mg by mouth 3 (three) times a week.) 36 tablet 2   levothyroxine  (SYNTHROID ) 50 MCG tablet Take 1 tablet (50 mcg total) by mouth daily. 90 tablet 0   No facility-administered medications prior to visit.    Allergies  Allergen Reactions   Lipitor [Atorvastatin]     myalgia   Penicillamine     Other reaction(s): Not available   Topamax  [Topiramate ] Other (See Comments)    BP decreased   Penicillins Rash    Other reaction(s): RASH    Pravastatin Other (See Comments)    Myalgia    Review of Systems  Skin:        Skin lesion in between right fourth and fifth toe       Objective:        06/18/2023    9:39 AM 06/05/2023    9:58 AM 05/01/2023    9:50 AM  Vitals with BMI   Height 5\' 3"  5\' 3"  5\' 3"   Weight 118 lbs 120 lbs 120 lbs  BMI 20.91 21.26 21.26  Systolic 128 129 161  Diastolic 76 72 82  Pulse 63 59 57  No data found.   Physical Exam Vitals reviewed.  Constitutional:      Appearance: Normal appearance.  Skin:    Findings: Lesion (between right 4th-5th toe - maceration) present.  Neurological:     Mental Status: She is alert.     Health Maintenance Due  Topic Date Due   DTaP/Tdap/Td (2 - Td or Tdap) 03/12/2023    There are no preventive care reminders to display for this patient.   Lab Results  Component Value Date   TSH 2.540 06/16/2023   Lab Results  Component Value Date   WBC 6.8 05/01/2023   HGB 14.2 05/01/2023   HCT 43.1 05/01/2023   MCV 93 05/01/2023   PLT 396 05/01/2023   Lab Results  Component Value Date   NA 143 06/16/2023   K 3.4 (L) 06/16/2023   CO2 23 06/16/2023   GLUCOSE 109 (H) 06/16/2023   BUN 15 06/16/2023   CREATININE 0.80 06/16/2023   BILITOT 0.6 06/16/2023   ALKPHOS 53 06/16/2023   AST 15 06/16/2023   ALT 8 06/16/2023   PROT 5.9 (L) 06/16/2023   ALBUMIN 4.1 06/16/2023   CALCIUM  9.8 06/16/2023   EGFR 76 06/16/2023   Lab Results  Component Value Date   CHOL 162 05/01/2023   Lab Results  Component Value Date   HDL 70 05/01/2023   Lab Results  Component Value Date   LDLCALC 78 05/01/2023   Lab Results  Component Value Date   TRIG 71 05/01/2023   Lab Results  Component Value Date   CHOLHDL 2.3 05/01/2023   Lab Results  Component Value Date   HGBA1C 6.1 (H) 05/01/2023       Assessment & Plan:  Tinea pedis of right foot -     Ketoconazole ; Apply 1 Application topically 2 (two) times daily.  Dispense: 60 g; Refill: 0  Hypothyroidism due to acquired atrophy of thyroid  -     Levothyroxine  Sodium; Take 1 tablet (50 mcg total) by mouth daily.  Dispense: 90 tablet; Refill: 2     Meds ordered this encounter  Medications   ketoconazole  (NIZORAL ) 2 % cream    Sig: Apply 1  Application topically 2 (two) times daily.    Dispense:  60 g    Refill:  0   levothyroxine  (SYNTHROID ) 50 MCG tablet    Sig: Take 1 tablet (50 mcg total) by mouth daily.    Dispense:  90 tablet    Refill:  2    No orders of the defined types were placed in this encounter.    Follow-up: Return if symptoms worsen or fail to improve.  An After Visit Summary was printed and given to the patient.  Mercy Stall, MD [facility] [phone]

## 2023-06-24 ENCOUNTER — Other Ambulatory Visit: Payer: Self-pay | Admitting: Nurse Practitioner

## 2023-06-26 ENCOUNTER — Other Ambulatory Visit

## 2023-06-26 NOTE — Telephone Encounter (Signed)
 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

## 2023-07-02 ENCOUNTER — Encounter: Payer: Self-pay | Admitting: "Endocrinology

## 2023-07-02 ENCOUNTER — Ambulatory Visit: Admitting: "Endocrinology

## 2023-07-02 ENCOUNTER — Telehealth: Payer: Self-pay

## 2023-07-02 VITALS — BP 110/70 | HR 65 | Ht 63.0 in | Wt 121.0 lb

## 2023-07-02 DIAGNOSIS — E039 Hypothyroidism, unspecified: Secondary | ICD-10-CM | POA: Diagnosis not present

## 2023-07-02 NOTE — Progress Notes (Signed)
 Outpatient Endocrinology Note Dana Newcomer, MD  07/02/23   Dana [name] 23-Aug-1945 [id number]  Referring Provider: Mercy Stall, MD Primary Care Provider: Mercy Stall, MD Subjective  No chief complaint on file.   Assessment & Plan  There are no diagnoses linked to this encounter.  [name] is currently taking levothyroxine  50 mcg po every day. Patient is currently biochemically euthyroid.  Educated on thyroid  axis.  Recommend the following: Take levothyroxine  50 mcg po every morning.  Advised to take levothyroxine  first thing in the morning on empty stomach and wait at least 30 minutes to 1 hour before eating or drinking anything or taking any other medications. Space out levothyroxine  by 4 hours from any acid reflux medication/fibrate/iron/calcium /multivitamin. Advised to take  nutritional supplements in the evening. Repeat lab before next visit or sooner if symptoms of hyperthyroidism or hypothyroidism develop.  Notify us  immediately in case of significant weight gain or loss. Counseled on compliance and follow up needs.  I have reviewed current medications, nurse's notes, allergies, vital signs, past medical and surgical history, family medical history, and social history for this encounter. Counseled patient on symptoms, examination findings, lab findings, imaging results, treatment decisions and monitoring and prognosis. The patient understood the recommendations and agrees with the treatment plan. All questions regarding treatment plan were fully answered.   No follow-ups on file.   Dana Newcomer, MD  07/02/23   I have reviewed current medications, nurse's notes, allergies, vital signs, past medical and surgical history, family medical history, and social history for this encounter. Counseled patient on symptoms, examination findings, lab findings, imaging results, treatment decisions and monitoring and prognosis. The patient understood the  recommendations and agrees with the treatment plan. All questions regarding treatment plan were fully answered.   History of Present Illness Dana [name] is a 78 y.o. year old female who presents to our clinic with hypothyroidism diagnosed around 2005.    Symptoms suggestive of HYPOTHYROIDISM:  fatigue No weight gain No cold intolerance  Yes constipation  Yes, a little   Symptoms suggestive of HYPERTHYROIDISM:  weight loss  No heat intolerance No hyperdefecation  No palpitations  No  Compressive symptoms:  dysphagia  No dysphonia  No positional dyspnea (especially with simultaneous arms elevation)  No  Smokes  No On biotin  No Personal history of head/neck surgery/irradiation  No  Physical Exam  LMP  (LMP Unknown)  Constitutional: well developed, well nourished Head: normocephalic, atraumatic, no exophthalmos Eyes: sclera anicteric, no redness Neck: no thyromegaly, no thyroid  tenderness; no nodules palpated Lungs: normal respiratory effort Neurology: alert and oriented, no fine hand tremor Skin: dry, no appreciable rashes Musculoskeletal: no appreciable defects Psychiatric: normal mood and affect  Allergies Allergies  Allergen Reactions   Lipitor [Atorvastatin]     myalgia   Penicillamine     Other reaction(s): Not available   Topamax  [Topiramate ] Other (See Comments)    BP decreased   Penicillins Rash    Other reaction(s): RASH    Pravastatin Other (See Comments)    Myalgia    Current Medications Patient's Medications  New Prescriptions   No medications on file  Previous Medications   ACETAZOLAMIDE  (DIAMOX ) 250 MG TABLET    Take 1 tablet (250 mg total) by mouth daily.   ALPRAZOLAM  (XANAX ) 0.25 MG TABLET    TAKE 1 TABLET BY MOUTH AT BEDTIME AS NEEDED FOR ANXIETY.   CONJUGATED ESTROGENS  (PREMARIN ) VAGINAL CREAM    Place 1 applicator vaginally as directed.  3 times a week   D-MANNOSE 500 MG CAPS    Take 2 capsules by mouth daily.   EZETIMIBE  (ZETIA ) 10  MG TABLET    TAKE 1 TABLET BY MOUTH DAILY.   HYDROCHLOROTHIAZIDE  (HYDRODIURIL ) 25 MG TABLET    TAKE 1 TABLET BY MOUTH DAILY.   KETOCONAZOLE  (NIZORAL ) 2 % CREAM    Apply 1 Application topically 2 (two) times daily.   LEVOTHYROXINE  (SYNTHROID ) 50 MCG TABLET    Take 1 tablet (50 mcg total) by mouth daily.   MELOXICAM  (MOBIC ) 7.5 MG TABLET    Take 1 tablet (7.5 mg total) by mouth 2 (two) times daily as needed for pain.   NEBIVOLOL  (BYSTOLIC ) 5 MG TABLET    Take 1 tablet (5 mg total) by mouth daily.   POTASSIUM CHLORIDE  SA (KLOR-CON  M) 20 MEQ TABLET    Take 2 tablets (40 mEq total) by mouth daily.   ROSUVASTATIN  (CRESTOR ) 5 MG TABLET    TAKE 1 TABLET BY MOUTH 3 TIMES A WEEK  Modified Medications   No medications on file  Discontinued Medications   No medications on file    Past Medical History Past Medical History:  Diagnosis Date   Anxiety disorder 07/19/2019   Chronic back pain 03/10/2018   Depression    Dyslipidemia 07/28/2019   Dyspnea on exertion 03/19/2019   Essential hypertension 03/19/2019   History of ovarian cancer    Hyperlipidemia 04/2015   Hypertension 02/2019   Hypothyroidism 03/10/2018   Impingement syndrome of right shoulder 05/12/2014   Insomnia due to other mental disorder 07/19/2019   Lumbar back pain 07/20/2019   Mixed hyperlipidemia 03/19/2019   Right shoulder pain 05/12/2014   Sciatic nerve pain, left 07/19/2019   Sciatic nerve pain, right 07/19/2019   UTI (urinary tract infection)     Past Surgical History Past Surgical History:  Procedure Laterality Date   ABDOMINAL HYSTERECTOMY  1986   Total, also had chemo   APPENDECTOMY  1986   ESOPHAGOGASTRODUODENOSCOPY     years ago   LAPAROTOMY  1987   To rule out recurrent ovarian cancer   TONSILLECTOMY      Family History family history includes Congestive Heart Failure in her mother; Neuropathy in her father; Osteoarthritis in her mother.  Social History Social History   Socioeconomic History    Marital status: Married    Spouse name: Jonell Neptune   Number of children: Not on file   Years of education: Not on file   Highest education level: Bachelor's degree (e.g., BA, AB, BS)  Occupational History   Not on file  Tobacco Use   Smoking status: Never   Smokeless tobacco: Never  Vaping Use   Vaping status: Never Used  Substance and Sexual Activity   Alcohol use: Not Currently    Alcohol/week: 1.0 standard drink of alcohol    Types: 1 Glasses of wine per week    Comment: occasionally   Drug use: Not Currently    Types: Solvent inhalants   Sexual activity: Yes    Partners: Male  Other Topics Concern   Not on file  Social History Narrative   Are you right handed or left handed? Right   Are you currently employed ? Retired   What is your current occupation?   Do you live at home alone? N   Who lives with you? Husband   What type of home do you live in: 1 story or 2 story? 1       Social  Drivers of Health   Financial Resource Strain: Low Risk  (03/23/2023)   Overall Financial Resource Strain (CARDIA)    Difficulty of Paying Living Expenses: Not hard at all  Food Insecurity: No Food Insecurity (03/23/2023)   Hunger Vital Sign    Worried About Running Out of Food in the Last Year: Never true    Ran Out of Food in the Last Year: Never true  Transportation Needs: No Transportation Needs (03/23/2023)   PRAPARE - Administrator, Civil Service (Medical): No    Lack of Transportation (Non-Medical): No  Physical Activity: Inactive (03/23/2023)   Exercise Vital Sign    Days of Exercise per Week: 0 days    Minutes of Exercise per Session: 40 min  Stress: No Stress Concern Present (03/23/2023)   [employer] of Occupational Health - Occupational Stress Questionnaire    Feeling of Stress : Only a little  Social Connections: Socially Integrated (03/23/2023)   Social Connection and Isolation Panel [NHANES]    Frequency of Communication with Friends and Family: More than three  times a week    Frequency of Social Gatherings with Friends and Family: Three times a week    Attends Religious Services: More than 4 times per year    Active Member of Clubs or Organizations: Yes    Attends Club or Organization Meetings: More than 4 times per year    Marital Status: Married  Catering manager Violence: Not At Risk (09/24/2022)   Humiliation, Afraid, Rape, and Kick questionnaire    Fear of Current or Ex-Partner: No    Emotionally Abused: No    Physically Abused: No    Sexually Abused: No    Laboratory Investigations Lab Results  Component Value Date   TSH 2.540 06/16/2023   TSH 0.245 (L) 05/01/2023   TSH 0.08 (L) 03/14/2023   FREET4 1.48 06/16/2023   FREET4 1.54 05/01/2023     No results found for: "TSI"   No components found for: "TRAB"   Lab Results  Component Value Date   CHOL 162 05/01/2023   Lab Results  Component Value Date   HDL 70 05/01/2023   Lab Results  Component Value Date   LDLCALC 78 05/01/2023   Lab Results  Component Value Date   TRIG 71 05/01/2023   Lab Results  Component Value Date   CHOLHDL 2.3 05/01/2023   Lab Results  Component Value Date   CREATININE 0.80 06/16/2023   No results found for: "GFR"    Component Value Date/Time   NA 143 06/16/2023 0801   K 3.4 (L) 06/16/2023 0801   CL 104 06/16/2023 0801   CO2 23 06/16/2023 0801   GLUCOSE 109 (H) 06/16/2023 0801   BUN 15 06/16/2023 0801   CREATININE 0.80 06/16/2023 0801   CALCIUM  9.8 06/16/2023 0801   PROT 5.9 (L) 06/16/2023 0801   ALBUMIN 4.1 06/16/2023 0801   AST 15 06/16/2023 0801   ALT 8 06/16/2023 0801   ALKPHOS 53 06/16/2023 0801   BILITOT 0.6 06/16/2023 0801   GFRNONAA 86 04/21/2020 0756   GFRAA 99 04/21/2020 0756      Latest Ref Rng & Units 06/16/2023    8:01 AM 05/01/2023   10:28 AM 03/27/2023    8:50 AM  BMP  Glucose 70 - 99 mg/dL 161  096  045   BUN 8 - 27 mg/dL 15  17  19    Creatinine 0.57 - 1.00 mg/dL 4.09  8.11  9.14   BUN/Creat Ratio  12 - 28 19   22  23    Sodium 134 - 144 mmol/L 143  141  142   Potassium 3.5 - 5.2 mmol/L 3.4  3.0  3.7   Chloride 96 - 106 mmol/L 104  103  103   CO2 20 - 29 mmol/L 23  25  23    Calcium  8.7 - 10.3 mg/dL 9.8  16.1  09.6        Component Value Date/Time   WBC 6.8 05/01/2023 1028   RBC 4.66 05/01/2023 1028   HGB 14.2 05/01/2023 1028   HCT 43.1 05/01/2023 1028   PLT 396 05/01/2023 1028   MCV 93 05/01/2023 1028   MCH 30.5 05/01/2023 1028   MCHC 32.9 05/01/2023 1028   RDW 13.1 05/01/2023 1028   LYMPHSABS 1.9 05/01/2023 1028   EOSABS 0.1 05/01/2023 1028   BASOSABS 0.0 05/01/2023 1028      Parts of this note may have been dictated using voice recognition software. There may be variances in spelling and vocabulary which are unintentional. Not all errors are proofread. Please notify the [facility] if any discrepancies are noted or if the meaning of any statement is not clear.

## 2023-07-02 NOTE — Telephone Encounter (Signed)
 Contacted Dana [name] to schedule their annual wellness visit. Appointment made for 09/29/23.

## 2023-07-04 ENCOUNTER — Other Ambulatory Visit: Payer: Self-pay | Admitting: Family Medicine

## 2023-07-18 ENCOUNTER — Telehealth: Payer: Self-pay | Admitting: Neurology

## 2023-07-18 ENCOUNTER — Other Ambulatory Visit: Payer: Self-pay | Admitting: Neurology

## 2023-07-18 MED ORDER — ACETAZOLAMIDE 250 MG PO TABS: 250.0000 mg | ORAL_TABLET | Freq: Two times a day (BID) | ORAL | 5 refills | Status: DC

## 2023-07-18 NOTE — Telephone Encounter (Signed)
 Pt called in stating she was doing better when she was taking her acetazolamide  twice a day. She is wanting to see if the prescription can be updated so she can start taking it twice a day?

## 2023-07-18 NOTE — Telephone Encounter (Signed)
 Patient advise per Dr.Jaffe,  New prescription for twice daily dosing of acetazolamide  sent to [facility]

## 2023-07-28 DIAGNOSIS — N390 Urinary tract infection, site not specified: Secondary | ICD-10-CM | POA: Diagnosis not present

## 2023-07-28 DIAGNOSIS — N952 Postmenopausal atrophic vaginitis: Secondary | ICD-10-CM | POA: Diagnosis not present

## 2023-08-04 ENCOUNTER — Encounter: Payer: Self-pay | Admitting: Family Medicine

## 2023-08-04 ENCOUNTER — Ambulatory Visit (INDEPENDENT_AMBULATORY_CARE_PROVIDER_SITE_OTHER): Admitting: Family Medicine

## 2023-08-04 VITALS — BP 108/64 | HR 63 | Temp 98.3°F | Ht 63.0 in | Wt 118.0 lb

## 2023-08-04 DIAGNOSIS — R7303 Prediabetes: Secondary | ICD-10-CM

## 2023-08-04 DIAGNOSIS — E034 Atrophy of thyroid (acquired): Secondary | ICD-10-CM | POA: Diagnosis not present

## 2023-08-04 DIAGNOSIS — R5383 Other fatigue: Secondary | ICD-10-CM

## 2023-08-04 DIAGNOSIS — E876 Hypokalemia: Secondary | ICD-10-CM | POA: Insufficient documentation

## 2023-08-04 DIAGNOSIS — F419 Anxiety disorder, unspecified: Secondary | ICD-10-CM

## 2023-08-04 DIAGNOSIS — I10 Essential (primary) hypertension: Secondary | ICD-10-CM

## 2023-08-04 DIAGNOSIS — E782 Mixed hyperlipidemia: Secondary | ICD-10-CM | POA: Diagnosis not present

## 2023-08-04 MED ORDER — ACETAZOLAMIDE 250 MG PO TABS: 250.0000 mg | ORAL_TABLET | Freq: Two times a day (BID) | ORAL | Status: DC

## 2023-08-04 NOTE — Assessment & Plan Note (Signed)
Well controlled.  No changes to medicines. Continue Zetia 10mg  taking one tablet daily, Rosuvastatin 5 mg take 3 times a week. Continue to work on eating a healthy diet and exercise.  Labs drawn today.

## 2023-08-04 NOTE — Progress Notes (Signed)
 Subjective:  Patient ID: Romeo Co, female    DOB: 1945-04-25  Age: 78 y.o. MRN: [id number]  Chief Complaint  Patient presents with   Medical Management of Chronic Issues    HPI: Hyperlipidemia: Current medications: Zetia  10mg  taking one tablet daily, Rosuvastatin  5 mg take 3 times a week. Tolerating well.   Hypertension: HCTZ 25mg  take 1 tablet daily, Nebivolol  HCL 5 mg one tablet daily. Bp is great today. Eat healthy. Exercise: too tired.  Hypothyroidism: Current medications: Levothyroxine  50 mcg take 1 tablet daily.  Anxiety: Current medications: Xanax  0.25mg  take 1/4 tablet daily at night because patient wants to come off of it. Taking melatonin for sleep which helps.   Hypokalemia: potassium chloride  - unable to swallow tablets. Patient has been doubling up the 10 meq capsules and opening them in to her yogurt. I am a little confused as I thought I sent her 10 meq tablets first and then when I recommended increase dose to 40 meq daily I sent the larger tablets.   Fatigue: takes an hour nap after lunch and this has helped. Still sleeping at night for 8 hours.      08/04/2023    9:30 AM 05/01/2023    9:53 AM 01/29/2023    9:29 AM 09/24/2022    8:14 AM 09/24/2022    8:12 AM  Depression screen PHQ 2/9  Decreased Interest 0 0 0 0 0  Down, Depressed, Hopeless 0 0 0 0 0  PHQ - 2 Score 0 0 0 0 0  Altered sleeping 0 1 2 1    Tired, decreased energy 1 3 3  0   Change in appetite 0 1 3 0   Feeling bad or failure about yourself  0 0 0 0   Trouble concentrating 0 0 2 1   Moving slowly or fidgety/restless 0 0 3 0   Suicidal thoughts 0 0 0 0   PHQ-9 Score 1 5 13 2    Difficult doing work/chores Not difficult at all Somewhat difficult Not difficult at all Not difficult at all         08/04/2023    9:30 AM  Fall Risk   Falls in the past year? 0  Number falls in past yr: 0  Injury with Fall? 0  Risk for fall due to : No Fall Risks  Follow up Falls evaluation completed     Patient Care Team: Mercy Stall, MD as PCP - General (Family Medicine) Manfred Seed, MD as Consulting Physician (Cardiology) Darlynn Elam. (Inactive) as Referring Physician (Obstetrics and Gynecology) Amedeo Jupiter, MD as Consulting Physician (Ophthalmology) Merriam Abbey, DO as Consulting Physician (Neurology)   Review of Systems  Constitutional:  Positive for fatigue. Negative for chills and fever.  HENT:  Negative for congestion, ear pain, rhinorrhea and sore throat.   Respiratory:  Negative for cough and shortness of breath.   Cardiovascular:  Negative for chest pain.  Gastrointestinal:  Negative for abdominal pain, constipation, diarrhea, nausea and vomiting.  Genitourinary:  Negative for dysuria and urgency.  Musculoskeletal:  Negative for back pain and myalgias.  Neurological:  Positive for weakness (some.). Negative for dizziness, light-headedness and headaches.  Psychiatric/Behavioral:  Negative for dysphoric mood. The patient is not nervous/anxious.     Current Outpatient Medications on File Prior to Visit  Medication Sig Dispense Refill   ALPRAZolam  (XANAX ) 0.25 MG tablet TAKE 1 TABLET BY MOUTH AT BEDTIME AS NEEDED FOR ANXIETY. 30 tablet 4   conjugated estrogens  (  PREMARIN ) vaginal cream Place 1 applicator vaginally as directed. 3 times a week     D-Mannose 500 MG CAPS Take 2 capsules by mouth daily.     ezetimibe  (ZETIA ) 10 MG tablet TAKE 1 TABLET BY MOUTH DAILY. 90 tablet 2   hydrochlorothiazide  (HYDRODIURIL ) 25 MG tablet TAKE 1 TABLET BY MOUTH DAILY. 90 tablet 0   levothyroxine  (SYNTHROID ) 50 MCG tablet Take 1 tablet (50 mcg total) by mouth daily. 90 tablet 2   nebivolol  (BYSTOLIC ) 5 MG tablet Take 1 tablet (5 mg total) by mouth daily. 90 tablet 2   potassium chloride  SA (KLOR-CON  M) 20 MEQ tablet Take 2 tablets (40 mEq total) by mouth daily. 60 tablet 1   rosuvastatin  (CRESTOR ) 5 MG tablet TAKE 1 TABLET BY MOUTH 3 TIMES A WEEK (Patient taking  differently: Take 5 mg by mouth 3 (three) times a week.) 36 tablet 2   No current facility-administered medications on file prior to visit.   Past Medical History:  Diagnosis Date   Anxiety disorder 07/19/2019   Chronic back pain 03/10/2018   Depression    Dyslipidemia 07/28/2019   Dyspnea on exertion 03/19/2019   Essential hypertension 03/19/2019   History of ovarian cancer    Hyperlipidemia 04/2015   Hypertension 02/2019   Hypothyroidism 03/10/2018   Impingement syndrome of right shoulder 05/12/2014   Insomnia due to other mental disorder 07/19/2019   Lumbar back pain 07/20/2019   Mixed hyperlipidemia 03/19/2019   Primary cough headache 03/21/2021   Right shoulder pain 05/12/2014   Right upper quadrant abdominal tenderness without rebound tenderness 01/29/2023   Sciatic nerve pain, left 07/19/2019   Sciatic nerve pain, right 07/19/2019   UTI (urinary tract infection)    Past Surgical History:  Procedure Laterality Date   ABDOMINAL HYSTERECTOMY  1986   Total, also had chemo   APPENDECTOMY  1986   ESOPHAGOGASTRODUODENOSCOPY     years ago   LAPAROTOMY  1987   To rule out recurrent ovarian cancer   TONSILLECTOMY      Family History  Problem Relation Age of Onset   Congestive Heart Failure Mother    Osteoarthritis Mother    Neuropathy Father    Esophageal cancer Neg Hx    Colon cancer Neg Hx    Social History   Socioeconomic History   Marital status: Married    Spouse name: Jonell Neptune   Number of children: Not on file   Years of education: Not on file   Highest education level: Bachelor's degree (e.g., BA, AB, BS)  Occupational History   Not on file  Tobacco Use   Smoking status: Never   Smokeless tobacco: Never  Vaping Use   Vaping status: Never Used  Substance and Sexual Activity   Alcohol use: Not Currently    Alcohol/week: 1.0 standard drink of alcohol    Types: 1 Glasses of wine per week    Comment: occasionally   Drug use: Not Currently    Types: Solvent  inhalants   Sexual activity: Yes    Partners: Male  Other Topics Concern   Not on file  Social History Narrative   Are you right handed or left handed? Right   Are you currently employed ? Retired   What is your current occupation?   Do you live at home alone? N   Who lives with you? Husband   What type of home do you live in: 1 story or 2 story? 1       Social  Drivers of Health   Financial Resource Strain: Low Risk  (03/23/2023)   Overall Financial Resource Strain (CARDIA)    Difficulty of Paying Living Expenses: Not hard at all  Food Insecurity: No Food Insecurity (03/23/2023)   Hunger Vital Sign    Worried About Running Out of Food in the Last Year: Never true    Ran Out of Food in the Last Year: Never true  Transportation Needs: No Transportation Needs (03/23/2023)   PRAPARE - Administrator, Civil Service (Medical): No    Lack of Transportation (Non-Medical): No  Physical Activity: Inactive (03/23/2023)   Exercise Vital Sign    Days of Exercise per Week: 0 days    Minutes of Exercise per Session: 40 min  Stress: No Stress Concern Present (03/23/2023)   [employer] of Occupational Health - Occupational Stress Questionnaire    Feeling of Stress : Only a little  Social Connections: Socially Integrated (03/23/2023)   Social Connection and Isolation Panel [NHANES]    Frequency of Communication with Friends and Family: More than three times a week    Frequency of Social Gatherings with Friends and Family: Three times a week    Attends Religious Services: More than 4 times per year    Active Member of Clubs or Organizations: Yes    Attends Banker Meetings: More than 4 times per year    Marital Status: Married    Objective:  BP 108/64   Pulse 63   Temp 98.3 F (36.8 C)   Ht 5\' 3"  (1.6 m)   Wt 118 lb (53.5 kg)   LMP  (LMP Unknown)   SpO2 100%   BMI 20.90 kg/m      08/04/2023    9:26 AM 07/02/2023    1:55 PM 06/18/2023    9:39 AM  BP/Weight   Systolic BP 108 110 128  Diastolic BP 64 70 76  Wt. (Lbs) 118 121 118  BMI 20.9 kg/m2 21.43 kg/m2 20.9 kg/m2    Physical Exam Vitals reviewed.  Constitutional:      Appearance: Normal appearance.  Neck:     Vascular: No carotid bruit.  Cardiovascular:     Rate and Rhythm: Normal rate and regular rhythm.     Heart sounds: Normal heart sounds.  Pulmonary:     Effort: Pulmonary effort is normal. No respiratory distress.     Breath sounds: Normal breath sounds.  Abdominal:     General: Abdomen is flat. Bowel sounds are normal.     Palpations: Abdomen is soft.     Tenderness: There is no abdominal tenderness.  Neurological:     Mental Status: She is alert and oriented to person, place, and time.  Psychiatric:        Mood and Affect: Mood normal.        Behavior: Behavior normal.     Diabetic Foot Exam - Simple   No data filed      Lab Results  Component Value Date   WBC 6.8 05/01/2023   HGB 14.2 05/01/2023   HCT 43.1 05/01/2023   PLT 396 05/01/2023   GLUCOSE 109 (H) 06/16/2023   CHOL 162 05/01/2023   TRIG 71 05/01/2023   HDL 70 05/01/2023   LDLCALC 78 05/01/2023   ALT 8 06/16/2023   AST 15 06/16/2023   NA 143 06/16/2023   K 3.4 (L) 06/16/2023   CL 104 06/16/2023   CREATININE 0.80 06/16/2023   BUN 15 06/16/2023   CO2  23 06/16/2023   TSH 2.540 06/16/2023   HGBA1C 6.1 (H) 05/01/2023      Assessment & Plan:  Essential hypertension Assessment & Plan: Well controlled.  No changes to medicines. Continue HCTZ 25mg  take 1 tablet daily, Nebivolol  HCL 5 mg takes 1 tablet dail Continue to work on eating a healthy diet and exercise.  Labs drawn today.    Orders: -     CBC with Differential/Platelet -     Comprehensive metabolic panel with GFR  Hypothyroidism due to acquired atrophy of thyroid  Assessment & Plan: Previously well controlled Continue Synthroid  at current dose  Recheck TSH and adjust Synthroid  as indicated    Orders: -     T4, free -      TSH  Prediabetes Assessment & Plan: Continue to work on healthy diet and exercise.    Orders: -     Hemoglobin A1c  Mixed hyperlipidemia Assessment & Plan: Well controlled.  No changes to medicines. Continue Zetia  10mg  taking one tablet daily, Rosuvastatin  5 mg take 3 times a week. Continue to work on eating a healthy diet and exercise.  Labs drawn today.    Orders: -     Lipid panel  Anxiety disorder, unspecified type Assessment & Plan: Continue xanax . Uses very sparingly.    Hypokalemia Assessment & Plan: Check potassium level.  After level comes back will call [facility] to determine capsule dose of potassium.    Other fatigue  Other orders -     acetaZOLAMIDE ; Take 1 tablet (250 mg total) by mouth 2 (two) times daily.     Meds ordered this encounter  Medications   acetaZOLAMIDE  (DIAMOX ) 250 MG tablet    Sig: Take 1 tablet (250 mg total) by mouth 2 (two) times daily.    Orders Placed This Encounter  Procedures   CBC with Differential/Platelet   Comprehensive metabolic panel with GFR   Hemoglobin A1c   Lipid panel   T4, free   TSH     Follow-up: Return in about 4 months (around 12/04/2023) for chronic follow up.   I,Marla I Leal-Borjas,acting as a scribe for Mercy Stall, MD.,have documented all relevant documentation on the behalf of Mercy Stall, MD,as directed by  Mercy Stall, MD while in the presence of Mercy Stall, MD.   An After Visit Summary was printed and given to the patient.  I attest that I have reviewed this visit and agree with the plan scribed by my staff.   Mercy Stall, MD [facility] [phone]

## 2023-08-04 NOTE — Assessment & Plan Note (Signed)
Well controlled.  No changes to medicines. Continue HCTZ 25mg  take 1 tablet daily, Nebivolol HCL 5 mg takes 1 tablet dail Continue to work on eating a healthy diet and exercise.  Labs drawn today.

## 2023-08-04 NOTE — Assessment & Plan Note (Signed)
 Check potassium level.  After level comes back will call [facility] to determine capsule dose of potassium.

## 2023-08-04 NOTE — Assessment & Plan Note (Signed)
 Continue xanax . Uses very sparingly.

## 2023-08-04 NOTE — Assessment & Plan Note (Signed)
 Previously well controlled Continue Synthroid at current dose  Recheck TSH and adjust Synthroid as indicated

## 2023-08-04 NOTE — Assessment & Plan Note (Signed)
Continue to work on healthy diet and exercise.   

## 2023-08-05 ENCOUNTER — Ambulatory Visit: Payer: Self-pay | Admitting: Family Medicine

## 2023-08-05 LAB — COMPREHENSIVE METABOLIC PANEL WITH GFR
ALT: 9 IU/L (ref 0–32)
AST: 16 IU/L (ref 0–40)
Albumin: 4.2 g/dL (ref 3.8–4.8)
Alkaline Phosphatase: 61 IU/L (ref 44–121)
BUN/Creatinine Ratio: 25 (ref 12–28)
BUN: 18 mg/dL (ref 8–27)
Bilirubin Total: 0.4 mg/dL (ref 0.0–1.2)
CO2: 23 mmol/L (ref 20–29)
Calcium: 9.9 mg/dL (ref 8.7–10.3)
Chloride: 102 mmol/L (ref 96–106)
Creatinine, Ser: 0.72 mg/dL (ref 0.57–1.00)
Globulin, Total: 1.8 g/dL (ref 1.5–4.5)
Glucose: 109 mg/dL — ABNORMAL HIGH (ref 70–99)
Potassium: 3.7 mmol/L (ref 3.5–5.2)
Sodium: 139 mmol/L (ref 134–144)
Total Protein: 6 g/dL (ref 6.0–8.5)
eGFR: 86 mL/min/{1.73_m2} (ref >59)

## 2023-08-05 LAB — LIPID PANEL
Chol/HDL Ratio: 2.5 ratio (ref 0.0–4.4)
Cholesterol, Total: 174 mg/dL (ref 100–199)
HDL: 71 mg/dL (ref >39)
LDL Chol Calc (NIH): 88 mg/dL (ref 0–99)
Triglycerides: 84 mg/dL (ref 0–149)
VLDL Cholesterol Cal: 15 mg/dL (ref 5–40)

## 2023-08-05 LAB — CBC WITH DIFFERENTIAL/PLATELET
Basophils Absolute: 0 10*3/uL (ref 0.0–0.2)
Basos: 1 %
EOS (ABSOLUTE): 0.2 10*3/uL (ref 0.0–0.4)
Eos: 3 %
Hematocrit: 42.2 % (ref 34.0–46.6)
Hemoglobin: 14.1 g/dL (ref 11.1–15.9)
Immature Grans (Abs): 0 10*3/uL (ref 0.0–0.1)
Immature Granulocytes: 0 %
Lymphocytes Absolute: 1.5 10*3/uL (ref 0.7–3.1)
Lymphs: 24 %
MCH: 31.4 pg (ref 26.6–33.0)
MCHC: 33.4 g/dL (ref 31.5–35.7)
MCV: 94 fL (ref 79–97)
Monocytes Absolute: 0.4 10*3/uL (ref 0.1–0.9)
Monocytes: 7 %
Neutrophils Absolute: 3.9 10*3/uL (ref 1.4–7.0)
Neutrophils: 65 %
Platelets: 336 10*3/uL (ref 150–450)
RBC: 4.49 x10E6/uL (ref 3.77–5.28)
RDW: 12.8 % (ref 11.7–15.4)
WBC: 6 10*3/uL (ref 3.4–10.8)

## 2023-08-05 LAB — TSH: TSH: 2.33 u[IU]/mL (ref 0.450–4.500)

## 2023-08-05 LAB — HEMOGLOBIN A1C
Est. average glucose Bld gHb Est-mCnc: 126 mg/dL
Hgb A1c MFr Bld: 6 % — ABNORMAL HIGH (ref 4.8–5.6)

## 2023-08-05 LAB — T4, FREE: Free T4: 1.34 ng/dL (ref 0.82–1.77)

## 2023-09-09 ENCOUNTER — Other Ambulatory Visit: Payer: Self-pay | Admitting: Cardiology

## 2023-09-09 DIAGNOSIS — H04123 Dry eye syndrome of bilateral lacrimal glands: Secondary | ICD-10-CM | POA: Diagnosis not present

## 2023-09-09 DIAGNOSIS — H40013 Open angle with borderline findings, low risk, bilateral: Secondary | ICD-10-CM | POA: Diagnosis not present

## 2023-09-09 DIAGNOSIS — H1045 Other chronic allergic conjunctivitis: Secondary | ICD-10-CM | POA: Diagnosis not present

## 2023-09-09 DIAGNOSIS — H35363 Drusen (degenerative) of macula, bilateral: Secondary | ICD-10-CM | POA: Diagnosis not present

## 2023-09-11 DIAGNOSIS — K080 Exfoliation of teeth due to systemic causes: Secondary | ICD-10-CM | POA: Diagnosis not present

## 2023-09-15 DIAGNOSIS — L821 Other seborrheic keratosis: Secondary | ICD-10-CM | POA: Diagnosis not present

## 2023-09-15 DIAGNOSIS — D1801 Hemangioma of skin and subcutaneous tissue: Secondary | ICD-10-CM | POA: Diagnosis not present

## 2023-09-15 DIAGNOSIS — L570 Actinic keratosis: Secondary | ICD-10-CM | POA: Diagnosis not present

## 2023-09-15 DIAGNOSIS — L720 Epidermal cyst: Secondary | ICD-10-CM | POA: Diagnosis not present

## 2023-09-15 DIAGNOSIS — L565 Disseminated superficial actinic porokeratosis (DSAP): Secondary | ICD-10-CM | POA: Diagnosis not present

## 2023-09-29 ENCOUNTER — Ambulatory Visit (INDEPENDENT_AMBULATORY_CARE_PROVIDER_SITE_OTHER): Admitting: Family Medicine

## 2023-09-29 ENCOUNTER — Encounter: Payer: Self-pay | Admitting: Family Medicine

## 2023-09-29 VITALS — BP 110/70 | HR 56 | Temp 98.0°F | Resp 16 | Ht 63.0 in | Wt 118.4 lb

## 2023-09-29 DIAGNOSIS — Z1231 Encounter for screening mammogram for malignant neoplasm of breast: Secondary | ICD-10-CM | POA: Diagnosis not present

## 2023-09-29 DIAGNOSIS — Z0000 Encounter for general adult medical examination without abnormal findings: Secondary | ICD-10-CM

## 2023-09-29 LAB — HM MAMMOGRAPHY

## 2023-09-29 NOTE — Assessment & Plan Note (Signed)
 Mammogram scheduled today Up to date on vaccines except for TDap - paperwork printed to get at the pharmacy   Things to do to keep yourself healthy  - Exercise at least 30-45 minutes a day, 3-4 days a week.  - Eat a low-fat diet with lots of fruits and vegetables, up to 7-9 servings per day.  - Seatbelts can save your life. Wear them always.  - Smoke detectors on every level of your home, check batteries every year.  - Eye Doctor - have an eye exam every 1-2 years  - Alcohol -  If you drink, do it moderately, less than 2 drinks per day.  - Health Care Power of Attorney. Choose someone to speak for you if you are not able.  - Depression is common in our stressful world.If you're feeling down or losing interest in things you normally enjoy, please come in for a visit.  - Violence - If anyone is threatening or hurting you, please call immediately.

## 2023-09-29 NOTE — Progress Notes (Signed)
 Subjective:   Dana [name] is a 78 y.o. female who presents for Medicare Annual (Subsequent) preventive examination.  Visit Complete: Dana [name] on 09/29/23   Patient Location: Other:  OFFICE  Provider Location: Office/Clinic   Patient Medicare AWV questionnaire was completed by the patient on 09/29/2023; I have confirmed that all information answered by patient is correct and no changes since this date.  Cardiac Risk Factors include: advanced age (>50men, >27 women);dyslipidemia;hypertension     Objective:    Today's Vitals   09/29/23 0929 09/29/23 0932  BP: 110/70   Pulse: (!) 56   Resp: 16   Temp: 98 F (36.7 C)   TempSrc: Temporal   SpO2: 100%   Weight: 118 lb 6.4 oz (53.7 kg)   Height: 5' 3 (1.6 m)   PainSc: 0-No pain 0-No pain   Body mass index is 20.97 kg/m.     06/05/2023    9:56 AM 12/31/2022    7:44 AM 09/05/2020    9:00 AM 07/29/2019   11:03 AM  Advanced Directives  Does Patient Have a Medical Advance Directive? No Yes Yes Yes  Type of Special educational needs teacher of [place];Living will Healthcare Power of [place];Living will Healthcare Power of [place];Living will  Does patient want to make changes to medical advance directive?   No - Patient declined No - Patient declined  Copy of Healthcare Power of Attorney in Chart?   No - copy requested No - copy requested    Current Medications (verified) Outpatient Encounter Medications as of 09/29/2023  Medication Sig   acetaZOLAMIDE  (DIAMOX ) 250 MG tablet Take 1 tablet (250 mg total) by mouth 2 (two) times daily.   ALPRAZolam  (XANAX ) 0.25 MG tablet TAKE 1 TABLET BY MOUTH AT BEDTIME AS NEEDED FOR ANXIETY.   conjugated estrogens  (PREMARIN ) vaginal cream Place 1 applicator vaginally as directed. 3 times a week   D-Mannose 500 MG CAPS Take 2 capsules by mouth daily.   estradiol (ESTRACE) 0.1 MG/GM vaginal cream Place 1 Applicatorful vaginally daily.   ezetimibe  (ZETIA ) 10 MG tablet TAKE 1 TABLET  BY MOUTH DAILY.   hydrochlorothiazide  (HYDRODIURIL ) 25 MG tablet TAKE 1 TABLET BY MOUTH DAILY.   levothyroxine  (SYNTHROID ) 50 MCG tablet Take 1 tablet (50 mcg total) by mouth daily.   nebivolol  (BYSTOLIC ) 5 MG tablet Take 1 tablet (5 mg total) by mouth daily.   rosuvastatin  (CRESTOR ) 5 MG tablet TAKE 1 TABLET BY MOUTH 3 TIMES A WEEK   [DISCONTINUED] potassium chloride  SA (KLOR-CON  M) 20 MEQ tablet Take 2 tablets (40 mEq total) by mouth daily.   No facility-administered encounter medications on file as of 09/29/2023.    Allergies (verified) Lipitor [atorvastatin], Penicillamine, Topamax  [topiramate ], Penicillins, and Pravastatin   History: Past Medical History:  Diagnosis Date   Anxiety disorder 07/19/2019   Cancer (HCC) 1986   Ovarian   Cataract 2010   Chronic back pain 03/10/2018   Depression    Dyslipidemia 07/28/2019   Dyspnea on exertion 03/19/2019   Essential hypertension 03/19/2019   History of ovarian cancer    Hyperlipidemia 04/2015   Hypertension 02/2019   Hypothyroidism 03/10/2018   Impingement syndrome of right shoulder 05/12/2014   Insomnia due to other mental disorder 07/19/2019   Lumbar back pain 07/20/2019   Mixed hyperlipidemia 03/19/2019   Primary cough headache 03/21/2021   Right shoulder pain 05/12/2014   Right upper quadrant abdominal tenderness without rebound tenderness 01/29/2023   Sciatic nerve pain, left 07/19/2019   Sciatic  nerve pain, right 07/19/2019   UTI (urinary tract infection)    Past Surgical History:  Procedure Laterality Date   ABDOMINAL HYSTERECTOMY  1986   Total, also had chemo   APPENDECTOMY  1986   ESOPHAGOGASTRODUODENOSCOPY     years ago   EYE SURGERY  2010   Cataract surgery   LAPAROTOMY  1987   To rule out recurrent ovarian cancer   TONSILLECTOMY     Family History  Problem Relation Age of Onset   Congestive Heart Failure Mother    Osteoarthritis Mother    Neuropathy Father    Esophageal cancer Neg Hx    Colon cancer  Neg Hx    Social History   Socioeconomic History   Marital status: Married    Spouse name: Todd   Number of children: Not on file   Years of education: Not on file   Highest education level: Bachelor's degree (e.g., BA, AB, BS)  Occupational History   Not on file  Tobacco Use   Smoking status: Never   Smokeless tobacco: Never  Vaping Use   Vaping status: Never Used  Substance and Sexual Activity   Alcohol use: Yes    Alcohol/week: 1.0 standard drink of alcohol    Types: 1 Glasses of wine per week    Comment: occasionally   Drug use: Not Currently    Types: Solvent inhalants   Sexual activity: Yes    Partners: Male    Birth control/protection: None  Other Topics Concern   Not on file  Social History Narrative   Are you right handed or left handed? Right   Are you currently employed ? Retired   What is your current occupation?   Do you live at home alone? N   Who lives with you? Husband   What type of home do you live in: 1 story or 2 story? 1       Social Drivers of Corporate investment banker Strain: Low Risk  (09/25/2023)   Overall Financial Resource Strain (CARDIA)    Difficulty of Paying Living Expenses: Not hard at all  Food Insecurity: No Food Insecurity (09/25/2023)   Hunger Vital Sign    Worried About Running Out of Food in the Last Year: Never true    Ran Out of Food in the Last Year: Never true  Transportation Needs: No Transportation Needs (09/25/2023)   PRAPARE - Administrator, Civil Service (Medical): No    Lack of Transportation (Non-Medical): No  Physical Activity: Insufficiently Active (09/25/2023)   Exercise Vital Sign    Days of Exercise per Week: 2 days    Minutes of Exercise per Session: 20 min  Stress: No Stress Concern Present (09/25/2023)   [employer] of Occupational Health - Occupational Stress Questionnaire    Feeling of Stress: Only a little  Social Connections: Socially Integrated (09/25/2023)   Social Connection and  Isolation Panel    Frequency of Communication with Friends and Family: More than three times a week    Frequency of Social Gatherings with Friends and Family: More than three times a week    Attends Religious Services: More than 4 times per year    Active Member of [employer] or Organizations: Yes    Attends Engineer, structural: More than 4 times per year    Marital Status: Married    Tobacco Counseling Counseling given: No   Clinical Intake:  Pre-visit preparation completed: Yes  Pain : No/denies pain Pain  Score: 0-No pain     BMI - recorded: 20.97 Nutritional Status: BMI of 19-24  Normal Nutritional Risks: None Diabetes: No  How often do you need to have someone help you when you read instructions, pamphlets, or other written materials from your doctor or pharmacy?: 1 - Never What is the last grade level you completed in school?: [employer]  Interpreter Needed?: No      Activities of Daily Living    09/29/2023    9:33 AM 09/25/2023   10:23 AM  In your present state of health, do you have any difficulty performing the following activities:  Hearing? 0 0  Vision? 0 0  Difficulty concentrating or making decisions? 0 0  Walking or climbing stairs? 0 0  Dressing or bathing? 0 0  Doing errands, shopping? 0 0  Preparing Food and eating ? N N  Using the Toilet? N N  In the past six months, have you accidently leaked urine? N N  Do you have problems with loss of bowel control? N N  Managing your Medications? N N  Managing your Finances? N N  Housekeeping or managing your Housekeeping? N N    Patient Care Team: Sherre Clapper, MD as PCP - General (Family Medicine) Bernie Lamar PARAS, MD as Consulting Physician (Cardiology) Ashley Darryle JAYSON Mickey. (Inactive) as Referring Physician (Obstetrics and Gynecology) Cleatus Collar, MD as Consulting Physician (Ophthalmology) Skeet Juliene SAUNDERS, DO as Consulting Physician (Neurology)  Indicate any recent Medical Services you may  have received from other than [facility] providers in the past year (date may be approximate).     Assessment:   This is a routine wellness examination for Dana [name].  Hearing/Vision screen No results found.   Goals Addressed             This Visit's Progress    Exercise 3x per week (30 min per time)   On track    Aim to do some physical activity for 150 minutes per week. This is typically divided into 5 days per week, 30 minutes per day. The activity should be enough to get your heart rate up. Anything is better than nothing if you have time constraints.        Keep A1C Low   On track    Last A1C 6.0%, patient will modify diet and exercise to remain < Prediabetic at next lab check     Pharmacy Care Plan   On track    CARE PLAN ENTRY (see longitudinal plan of care for additional care plan information)  Current Barriers:  Chronic Disease Management support, education, and care coordination needs related to Hypertension and Hyperlipidemia   Hypertension BP Readings from Last 3 Encounters:  09/29/23 110/70  08/04/23 108/64  07/02/23 110/70    Pharmacist Clinical Goal(s): Over the next 90 days, patient will work with PharmD and providers to maintain BP goal <130/80 Current regimen:  Losartan  25 mg daily  Bystolic  20 mg daily  Interventions: Patient reports well controlled blood pressure.  Reviewed medication regimen.  Discussed diet and lifestyle.  Encouraged patient to resume walking and yoga for exercise 3 times weekly as tolerated.  Patient self care activities - Over the next 90 days, patient will: Check BP as needed, document, and provide at future appointments Ensure daily salt intake < 2300 mg/day  Hyperlipidemia Lab Results  Component Value Date   [facility] 08/04/2023     Pharmacist Clinical Goal(s): Over the next 90 days, patient will work with PharmD and  providers to maintain LDL goal < 100 Current regimen:  atorvastatin 10 mg daily   Interventions: Medication regimen reviewed.  Discussed patient's history of not tolerating 2 previous statin medications and leg weakness and muscle pain for the past few months similar to previous reaction to other statin medications.  Discussed options of adding CoQ10 supplement daily, reducing dose, or alternate dose schedule.  Patient self care activities - Over the next 90 days, patient will: Patient request to trial off atorvastatin before next appointment with Dr. Sherre to assess lipid panel response and muscle pain/weakness improvement.  Medication management Pharmacist Clinical Goal(s): Over the next 90 days, patient will work with PharmD and providers to maintain optimal medication adherence Current pharmacy: [facility]  Interventions Comprehensive medication review performed. Continue current medication management strategy Patient self care activities - Over the next 90 days, patient will: Focus on medication adherence by using pill box.  Take medications as prescribed Report any questions or concerns to PharmD and/or provider(s)  Please see past updates related to this goal by clicking on the Past Updates button in the selected goal         Depression Screen    09/29/2023    9:37 AM 08/04/2023    9:30 AM 05/01/2023    9:53 AM 01/29/2023    9:29 AM 09/24/2022    8:14 AM 09/24/2022    8:12 AM 09/19/2021    1:26 PM  PHQ 2/9 Scores  PHQ - 2 Score 0 0 0 0 0 0 0  PHQ- 9 Score 1 1 5 13 2       Fall Risk    09/29/2023    9:36 AM 09/25/2023   10:23 AM 08/04/2023    9:30 AM 06/05/2023    9:56 AM 01/29/2023    9:29 AM  Fall Risk   Falls in the past year? 0 0 0 0 0  Number falls in past yr: 0 0 0 0 0  Injury with Fall? 0 0 0 0 0  Risk for fall due to : No Fall Risks  No Fall Risks  No Fall Risks  Follow up Falls evaluation completed  Falls evaluation completed Falls evaluation completed Falls evaluation completed    MEDICARE RISK AT HOME: Medicare Risk at Home Any  stairs in or around the home?: Yes If so, are there any without handrails?: Yes Home free of loose throw rugs in walkways, pet beds, electrical cords, etc?: No Adequate lighting in your home to reduce risk of falls?: Yes Life alert?: No Use of a cane, walker or w/c?: No Grab bars in the bathroom?: No Shower chair or bench in shower?: No Elevated toilet seat or a handicapped toilet?: No  TIMED UP AND GO:  Was the test performed?  Yes  Length of time to ambulate 10 feet: 8 sec Gait steady and fast without use of assistive device    Cognitive Function:        09/29/2023    9:37 AM 09/19/2021    1:27 PM 09/05/2020    9:12 AM 07/29/2019   11:36 AM  6CIT Screen  What Year? 0 points 0 points 0 points 0 points  What month? 0 points 0 points 0 points 0 points  What time? 0 points 0 points 0 points 0 points  Count back from 20 0 points 0 points 0 points 0 points  Months in reverse 0 points 0 points 0 points 0 points  Repeat phrase 0 points 0 points 0 points 0  points  Total Score 0 points 0 points 0 points 0 points    Immunizations Immunization History  Administered Date(s) Administered   Fluad Quad(high Dose 65+) 11/10/2020, 12/17/2021   Influenza Inj Mdck Quad Pf 11/05/2019   Influenza, High Dose Seasonal PF 11/30/2012, 11/26/2013, 12/12/2014, 12/17/2021, 11/25/2022   Influenza-Unspecified 11/25/2017, 11/30/2018, 01/07/2023   Moderna Covid-19 Fall Seasonal Vaccine 58yrs & older 12/17/2021, 11/25/2022   PFIZER Comirnaty(Gray Top)Covid-19 Tri-Sucrose Vaccine 03/13/2019, 04/03/2019, 11/26/2019, 06/05/2020   PFIZER(Purple Top)SARS-COV-2 Vaccination 03/14/2019, 04/03/2019, 11/26/2019, 06/05/2020   Pfizer Covid-19 Vaccine Bivalent Booster 8yrs & up 12/18/2020   Pfizer Fall 2024 Covid-19 Vaccine 65yrs thru 65yrs. 12/17/2021   Pfizer(Comirnaty)Fall Seasonal Vaccine 12 years and older 01/07/2023   Pneumococcal Conjugate-13 05/13/2013, 05/30/2014   Pneumococcal Polysaccharide-23 12/18/2006,  08/03/2011   Tdap 03/11/2013   Zoster Recombinant(Shingrix) 11/06/2017, 02/03/2018   Zoster, Live 10/23/2007    TDAP status: Due, Education has been provided regarding the importance of this vaccine. Advised may receive this vaccine at local pharmacy or Health Dept. Aware to provide a copy of the vaccination record if obtained from local pharmacy or Health Dept. Verbalized acceptance and understanding.  Flu Vaccine status: Due, Education has been provided regarding the importance of this vaccine. Advised may receive this vaccine at local pharmacy or Health Dept. Aware to provide a copy of the vaccination record if obtained from local pharmacy or Health Dept. Verbalized acceptance and understanding.  Pneumococcal vaccine status: Up to date  Covid-19 vaccine status: Information provided on how to obtain vaccines.   Qualifies for Shingles Vaccine? Yes   Zostavax completed No   Shingrix Completed?: Yes  Screening Tests Health Maintenance  Topic Date Due   DTaP/Tdap/Td (2 - Td or Tdap) 03/12/2023   COVID-19 Vaccine (11 - Pfizer risk 2024-25 season) 07/07/2023   MAMMOGRAM  09/27/2023   INFLUENZA VACCINE  09/26/2023   Medicare Annual Wellness (AWV)  09/28/2024   Pneumococcal Vaccine: 50+ Years  Completed   DEXA SCAN  Completed   Hepatitis C Screening  Completed   Zoster Vaccines- Shingrix  Completed   Hepatitis B Vaccines  Aged Out   HPV VACCINES  Aged Out   Meningococcal B Vaccine  Aged Out   Colonoscopy  Discontinued   Fecal DNA (Cologuard)  Discontinued    Health Maintenance  Health Maintenance Due  Topic Date Due   DTaP/Tdap/Td (2 - Td or Tdap) 03/12/2023   COVID-19 Vaccine (11 - Pfizer risk 2024-25 season) 07/07/2023   MAMMOGRAM  09/27/2023   INFLUENZA VACCINE  09/26/2023    Colorectal cancer screening: Type of screening: Cologuard. Completed 10/17/2021. Repeat every 03 years  Mammogram status: Ordered - Appointment is for today.  Bone Density status: Completed  07/28/2017. Results reflect: Bone density results: NORMAL. Repeat every 0 years.  Lung Cancer Screening: (Low Dose CT Chest recommended if Age 1-80 years, 20 pack-year currently smoking OR have quit w/in 15years.) does not qualify.   Lung Cancer Screening Referral: N/A  Additional Screening:  Hepatitis C Screening: does not qualify; Completed 09/24/2022  Vision Screening: Recommended annual ophthalmology exams for early detection of glaucoma and other disorders of the eye. Is the patient up to date with their annual eye exam?  Yes  Who is the provider or what is the name of the office in which the patient attends annual eye exams? YES If pt is not established with a provider, would they like to be referred to a provider to establish care? NO   Dental Screening: Recommended annual dental  exams for proper oral hygiene  Diabetic Foot Exam: N/A  Community Resource Referral / Chronic Care Management: CRR required this visit?  No   CCM required this visit?  No     Plan:    Encounter for Medicare annual wellness exam Mammogram scheduled today Up to date on vaccines except for TDap - paperwork printed to get at the pharmacy   Things to do to keep yourself healthy  - Exercise at least 30-45 minutes a day, 3-4 days a week.  - Eat a low-fat diet with lots of fruits and vegetables, up to 7-9 servings per day.  - Seatbelts can save your life. Wear them always.  - Smoke detectors on every level of your home, check batteries every year.  - Eye Doctor - have an eye exam every 1-2 years  - Alcohol -  If you drink, do it moderately, less than 2 drinks per day.  - Health Care Power of Attorney. Choose someone to speak for you if you are not able.  - Depression is common in our stressful world.If you're feeling down or losing interest in things you normally enjoy, please come in for a visit.  - Violence - If anyone is threatening or hurting you, please call immediately.   I have personally  reviewed and noted the following in the patient's chart:   Medical and social history Use of alcohol, tobacco or illicit drugs  Current medications and supplements including opioid prescriptions. Patient is not currently taking opioid prescriptions. Functional ability and status Nutritional status Physical activity Advanced directives List of other physicians Hospitalizations, surgeries, and ER visits in previous 12 months Vitals Screenings to include cognitive, depression, and falls Referrals and appointments  In addition, I have reviewed and discussed with patient certain preventive protocols, quality metrics, and best practice recommendations. A written personalized care plan for preventive services as well as general preventive health recommendations were provided to patient.    Harrie Cedar, FNP [facility] [phone]     09/29/2023   After Visit Summary: (In Person-Printed) AVS printed and given to the patient

## 2023-09-30 ENCOUNTER — Ambulatory Visit: Payer: Self-pay | Admitting: Family Medicine

## 2023-09-30 ENCOUNTER — Other Ambulatory Visit

## 2023-09-30 ENCOUNTER — Encounter: Payer: Self-pay | Admitting: Family Medicine

## 2023-09-30 ENCOUNTER — Ambulatory Visit: Admitting: Family Medicine

## 2023-09-30 DIAGNOSIS — E039 Hypothyroidism, unspecified: Secondary | ICD-10-CM | POA: Diagnosis not present

## 2023-09-30 LAB — TSH+FREE T4: TSH W/REFLEX TO FT4: 2.25 m[IU]/L (ref 0.40–4.50)

## 2023-10-01 ENCOUNTER — Other Ambulatory Visit: Payer: Self-pay | Admitting: Family Medicine

## 2023-10-07 ENCOUNTER — Encounter: Payer: Self-pay | Admitting: "Endocrinology

## 2023-10-07 ENCOUNTER — Ambulatory Visit: Admitting: "Endocrinology

## 2023-10-07 VITALS — BP 124/80 | HR 54 | Ht 63.0 in | Wt 119.0 lb

## 2023-10-07 DIAGNOSIS — E034 Atrophy of thyroid (acquired): Secondary | ICD-10-CM

## 2023-10-07 DIAGNOSIS — E039 Hypothyroidism, unspecified: Secondary | ICD-10-CM | POA: Diagnosis not present

## 2023-10-07 MED ORDER — LEVOTHYROXINE SODIUM 50 MCG PO TABS: 50.0000 ug | ORAL_TABLET | Freq: Every day | ORAL | 2 refills | Status: AC

## 2023-10-07 NOTE — Progress Notes (Signed)
 Outpatient Endocrinology Note Obadiah Birmingham, MD  10/07/23   Dana [name] 11/24/1945 [id number]  Referring Provider: Sherre Clapper, MD Primary Care Provider: Sherre Clapper, MD Subjective  No chief complaint on file.   Assessment & Plan  Diagnoses and all orders for this visit:  Acquired hypothyroidism -     TSH + free T4  Hypothyroidism due to acquired atrophy of thyroid  -     levothyroxine  (SYNTHROID ) 50 MCG tablet; Take 1 tablet (50 mcg total) by mouth daily.    PRUDIE GUTHRIDGE is currently taking levothyroxine  50 mcg po every day. Patient is currently biochemically euthyroid.  Educated on thyroid  axis.  Recommend the following: Take levothyroxine  50 mcg po every morning.  Advised to take levothyroxine  first thing in the morning on empty stomach and wait at least 30 minutes to 1 hour before eating or drinking anything or taking any other medications. Space out levothyroxine  by 4 hours from any acid reflux medication/fibrate/iron/calcium /multivitamin. Advised to take  nutritional supplements in the evening. Repeat lab before next visit or sooner if symptoms of hyperthyroidism or hypothyroidism develop.  Notify us  immediately in case of significant weight gain or loss. Counseled on compliance and follow up needs.  I have reviewed current medications, nurse's notes, allergies, vital signs, past medical and surgical history, family medical history, and social history for this encounter. Counseled patient on symptoms, examination findings, lab findings, imaging results, treatment decisions and monitoring and prognosis. The patient understood the recommendations and agrees with the treatment plan. All questions regarding treatment plan were fully answered.   Return in about 6 months (around 04/08/2024) for visit + labs before next visit.   Obadiah Birmingham, MD  10/07/23   I have reviewed current medications, nurse's notes, allergies, vital signs, past medical and surgical  history, family medical history, and social history for this encounter. Counseled patient on symptoms, examination findings, lab findings, imaging results, treatment decisions and monitoring and prognosis. The patient understood the recommendations and agrees with the treatment plan. All questions regarding treatment plan were fully answered.   History of Present Illness Dana [name] is a 78 y.o. year old female who presents to our clinic with hypothyroidism diagnosed around 2005.    Symptoms suggestive of HYPOTHYROIDISM:  fatigue Yes weight gain No cold intolerance  Yes constipation  Yes, a little   Symptoms suggestive of HYPERTHYROIDISM:  weight loss  No heat intolerance No hyperdefecation  No palpitations  No  Compressive symptoms:  dysphagia  No dysphonia  No positional dyspnea (especially with simultaneous arms elevation)  No  Smokes  No On biotin  No Personal history of head/neck surgery/irradiation  No  Physical Exam  BP 124/80   Pulse (!) 54   Ht 5' 3 (1.6 m)   Wt 119 lb (54 kg)   LMP  (LMP Unknown)   SpO2 96%   BMI 21.08 kg/m  Constitutional: well developed, well nourished Head: normocephalic, atraumatic, no exophthalmos Eyes: sclera anicteric, no redness Neck: no thyromegaly, no thyroid  tenderness; no nodules palpated Lungs: normal respiratory effort Neurology: alert and oriented, no fine hand tremor Skin: dry, no appreciable rashes Musculoskeletal: no appreciable defects Psychiatric: normal mood and affect  Allergies Allergies  Allergen Reactions   Lipitor [Atorvastatin]     myalgia   Penicillamine     Other reaction(s): Not available   Topamax  [Topiramate ] Other (See Comments)    BP decreased   Penicillins Rash    Other reaction(s): RASH    Pravastatin  Other (See Comments)    Myalgia    Current Medications Patient's Medications  New Prescriptions   No medications on file  Previous Medications   ACETAZOLAMIDE  (DIAMOX ) 250 MG TABLET     Take 1 tablet (250 mg total) by mouth 2 (two) times daily.   ALPRAZOLAM  (XANAX ) 0.25 MG TABLET    TAKE 1 TABLET BY MOUTH AT BEDTIME AS NEEDED FOR ANXIETY.   CONJUGATED ESTROGENS  (PREMARIN ) VAGINAL CREAM    Place 1 applicator vaginally as directed. 3 times a week   D-MANNOSE 500 MG CAPS    Take 2 capsules by mouth daily.   ESTRADIOL (ESTRACE) 0.1 MG/GM VAGINAL CREAM    Place 1 Applicatorful vaginally daily.   EZETIMIBE  (ZETIA ) 10 MG TABLET    TAKE 1 TABLET BY MOUTH DAILY.   HYDROCHLOROTHIAZIDE  (HYDRODIURIL ) 25 MG TABLET    TAKE 1 TABLET BY MOUTH DAILY.   NEBIVOLOL  (BYSTOLIC ) 5 MG TABLET    Take 1 tablet (5 mg total) by mouth daily.   ROSUVASTATIN  (CRESTOR ) 5 MG TABLET    TAKE 1 TABLET BY MOUTH 3 TIMES A WEEK  Modified Medications   Modified Medication Previous Medication   LEVOTHYROXINE  (SYNTHROID ) 50 MCG TABLET levothyroxine  (SYNTHROID ) 50 MCG tablet      Take 1 tablet (50 mcg total) by mouth daily.    Take 1 tablet (50 mcg total) by mouth daily.  Discontinued Medications   No medications on file    Past Medical History Past Medical History:  Diagnosis Date   Anxiety disorder 07/19/2019   Cancer (HCC) 1986   Ovarian   Cataract 2010   Chronic back pain 03/10/2018   Depression    Dyslipidemia 07/28/2019   Dyspnea on exertion 03/19/2019   Essential hypertension 03/19/2019   History of ovarian cancer    Hyperlipidemia 04/2015   Hypertension 02/2019   Hypothyroidism 03/10/2018   Impingement syndrome of right shoulder 05/12/2014   Insomnia due to other mental disorder 07/19/2019   Lumbar back pain 07/20/2019   Mixed hyperlipidemia 03/19/2019   Primary cough headache 03/21/2021   Right shoulder pain 05/12/2014   Right upper quadrant abdominal tenderness without rebound tenderness 01/29/2023   Sciatic nerve pain, left 07/19/2019   Sciatic nerve pain, right 07/19/2019   UTI (urinary tract infection)     Past Surgical History Past Surgical History:  Procedure Laterality Date    ABDOMINAL HYSTERECTOMY  1986   Total, also had chemo   APPENDECTOMY  1986   ESOPHAGOGASTRODUODENOSCOPY     years ago   EYE SURGERY  2010   Cataract surgery   LAPAROTOMY  1987   To rule out recurrent ovarian cancer   TONSILLECTOMY      Family History family history includes Congestive Heart Failure in her mother; Neuropathy in her father; Osteoarthritis in her mother.  Social History Social History   Socioeconomic History   Marital status: Married    Spouse name: Todd   Number of children: Not on file   Years of education: Not on file   Highest education level: Bachelor's degree (e.g., BA, AB, BS)  Occupational History   Not on file  Tobacco Use   Smoking status: Never   Smokeless tobacco: Never  Vaping Use   Vaping status: Never Used  Substance and Sexual Activity   Alcohol use: Yes    Alcohol/week: 1.0 standard drink of alcohol    Types: 1 Glasses of wine per week    Comment: occasionally   Drug use: Not Currently  Types: Solvent inhalants   Sexual activity: Yes    Partners: Male    Birth control/protection: None  Other Topics Concern   Not on file  Social History Narrative   Are you right handed or left handed? Right   Are you currently employed ? Retired   What is your current occupation?   Do you live at home alone? N   Who lives with you? Husband   What type of home do you live in: 1 story or 2 story? 1       Social Drivers of Corporate investment banker Strain: Low Risk  (09/25/2023)   Overall Financial Resource Strain (CARDIA)    Difficulty of Paying Living Expenses: Not hard at all  Food Insecurity: No Food Insecurity (09/25/2023)   Hunger Vital Sign    Worried About Running Out of Food in the Last Year: Never true    Ran Out of Food in the Last Year: Never true  Transportation Needs: No Transportation Needs (09/25/2023)   PRAPARE - Administrator, Civil Service (Medical): No    Lack of Transportation (Non-Medical): No  Physical  Activity: Insufficiently Active (09/25/2023)   Exercise Vital Sign    Days of Exercise per Week: 2 days    Minutes of Exercise per Session: 20 min  Stress: No Stress Concern Present (09/25/2023)   [employer] of Occupational Health - Occupational Stress Questionnaire    Feeling of Stress: Only a little  Social Connections: Socially Integrated (09/25/2023)   Social Connection and Isolation Panel    Frequency of Communication with Friends and Family: More than three times a week    Frequency of Social Gatherings with Friends and Family: More than three times a week    Attends Religious Services: More than 4 times per year    Active Member of Clubs or Organizations: Yes    Attends Banker Meetings: More than 4 times per year    Marital Status: Married  Catering manager Violence: Not At Risk (09/24/2022)   Humiliation, Afraid, Rape, and Kick questionnaire    Fear of Current or Ex-Partner: No    Emotionally Abused: No    Physically Abused: No    Sexually Abused: No    Laboratory Investigations Lab Results  Component Value Date   TSH 2.330 08/04/2023   TSH 2.540 06/16/2023   TSH 0.245 (L) 05/01/2023   FREET4 1.34 08/04/2023   FREET4 1.48 06/16/2023   FREET4 1.54 05/01/2023     No results found for: TSI   No components found for: TRAB   Lab Results  Component Value Date   CHOL 174 08/04/2023   Lab Results  Component Value Date   HDL 71 08/04/2023   Lab Results  Component Value Date   LDLCALC 88 08/04/2023   Lab Results  Component Value Date   TRIG 84 08/04/2023   Lab Results  Component Value Date   CHOLHDL 2.5 08/04/2023   Lab Results  Component Value Date   CREATININE 0.72 08/04/2023   No results found for: GFR    Component Value Date/Time   NA 139 08/04/2023 1010   K 3.7 08/04/2023 1010   CL 102 08/04/2023 1010   CO2 23 08/04/2023 1010   GLUCOSE 109 (H) 08/04/2023 1010   BUN 18 08/04/2023 1010   CREATININE 0.72 08/04/2023 1010    CALCIUM  9.9 08/04/2023 1010   PROT 6.0 08/04/2023 1010   ALBUMIN 4.2 08/04/2023 1010   AST 16  08/04/2023 1010   ALT 9 08/04/2023 1010   ALKPHOS 61 08/04/2023 1010   BILITOT 0.4 08/04/2023 1010   GFRNONAA 86 04/21/2020 0756   GFRAA 99 04/21/2020 0756      Latest Ref Rng & Units 08/04/2023   10:10 AM 06/16/2023    8:01 AM 05/01/2023   10:28 AM  BMP  Glucose 70 - 99 mg/dL 890  890  888   BUN 8 - 27 mg/dL 18  15  17    Creatinine 0.57 - 1.00 mg/dL 9.27  9.19  9.23   BUN/Creat Ratio 12 - 28 25  19  22    Sodium 134 - 144 mmol/L 139  143  141   Potassium 3.5 - 5.2 mmol/L 3.7  3.4  3.0   Chloride 96 - 106 mmol/L 102  104  103   CO2 20 - 29 mmol/L 23  23  25    Calcium  8.7 - 10.3 mg/dL 9.9  9.8  89.7        Component Value Date/Time   WBC 6.0 08/04/2023 1010   RBC 4.49 08/04/2023 1010   HGB 14.1 08/04/2023 1010   HCT 42.2 08/04/2023 1010   PLT 336 08/04/2023 1010   MCV 94 08/04/2023 1010   MCH 31.4 08/04/2023 1010   MCHC 33.4 08/04/2023 1010   RDW 12.8 08/04/2023 1010   LYMPHSABS 1.5 08/04/2023 1010   EOSABS 0.2 08/04/2023 1010   BASOSABS 0.0 08/04/2023 1010      Parts of this note may have been dictated using voice recognition software. There may be variances in spelling and vocabulary which are unintentional. Not all errors are proofread. Please notify the dino if any discrepancies are noted or if the meaning of any statement is not clear.

## 2023-11-04 ENCOUNTER — Telehealth: Payer: Self-pay

## 2023-11-04 ENCOUNTER — Encounter: Payer: Self-pay | Admitting: Family Medicine

## 2023-11-04 NOTE — Telephone Encounter (Signed)
 Done  Copied from CRM [id number]. Topic: Clinical - Medication Question >> Nov 03, 2023  2:50 PM Sophia H wrote: Reason for CRM: Patient is requesting a rx for a covid vaccine, states she tried to get it done at the pharmacy and was advised they need to have this on file prior to her getting it with them. Please reach out and advise # [phone]   [facility] on [address]

## 2023-12-07 NOTE — Progress Notes (Signed)
 "  Subjective:  Patient ID: Dana [name], female    DOB: May 08, 1945  Age: 78 y.o. MRN: [id number]  Chief Complaint  Patient presents with   Medical Management of Chronic Issues    HPI: Discussed the use of AI scribe software for clinical note transcription with the patient, who gave verbal consent to proceed.  History of Present Illness Dana [name] is a 78 year old female who presents with persistent fatigue and right-sided abdominal pain.  Fatigue and balance disturbance - Persistent fatigue for approximately one year, distinct from normal tiredness - Fatigue affects balance and causes a sensation of potential falls - No fever, chills, or sweats  Sleep disturbance - Difficulty sleeping when discontinuing Xanax  - Resumed a small dose of Xanax  before bed, resulting in improved sleep quality - No snoring or breathing interruptions during sleep  Right upper quadrant abdominal pain - Sharp pain in the right upper abdomen radiating to the back - Pain episodes last for hours - Pain not associated with eating - Stretching provides some relief - Patient has a history of an abnormal MRI of her abdomen and she is post to have this rechecked.  This was ordered by GI towards the beginning of 2025.  Unintentional weight loss - Current weight 117 pounds, down from 140 pounds over the past year - Maintains a diet of three balanced meals daily, including protein, fruits, and vegetables  Paresthesia of hands and feet - Tingling and cold sensations in the hands, attributed to Diamox  use for headaches - No color change in the hands, but persistent tingling at the ends - Feet affected to a lesser extent  General review of systems - No earaches, sore throat, stuffy nose, chest pain, or breathing problems       09/29/2023    9:37 AM 08/04/2023    9:30 AM 05/01/2023    9:53 AM 01/29/2023    9:29 AM 09/24/2022    8:14 AM  Depression screen PHQ 2/9  Decreased Interest 0 0 0 0 0  Down,  Depressed, Hopeless 0 0 0 0 0  PHQ - 2 Score 0 0 0 0 0  Altered sleeping 0 0 1 2 1   Tired, decreased energy 1 1 3 3  0  Change in appetite 0 0 1 3 0  Feeling bad or failure about yourself  0 0 0 0 0  Trouble concentrating 0 0 0 2 1  Moving slowly or fidgety/restless 0 0 0 3 0  Suicidal thoughts 0 0 0 0 0  PHQ-9 Score 1 1 5 13 2   Difficult doing work/chores Not difficult at all Not difficult at all Somewhat difficult Not difficult at all Not difficult at all        09/29/2023    9:36 AM  Fall Risk   Falls in the past year? 0  Number falls in past yr: 0  Injury with Fall? 0  Risk for fall due to : No Fall Risks  Follow up Falls evaluation completed    Patient Care Team: Sherre Clapper, MD as PCP - General (Family Medicine) Bernie Lamar PARAS, MD as Consulting Physician (Cardiology) Ashley Darryle JAYSON Mickey. (Inactive) as Referring Physician (Obstetrics and Gynecology) Cleatus Collar, MD as Consulting Physician (Ophthalmology) Skeet Juliene SAUNDERS, DO as Consulting Physician (Neurology)   Review of Systems  Constitutional:  Positive for fatigue. Negative for chills, diaphoresis and fever.  HENT:  Negative for congestion, ear pain, sinus pain and sore throat.   Eyes: Negative.  Respiratory:  Negative for cough and shortness of breath.   Cardiovascular:  Negative for chest pain.  Gastrointestinal:  Negative for abdominal pain, constipation, diarrhea, nausea and vomiting.  Endocrine: Negative.   Genitourinary:  Negative for dysuria, frequency and urgency.  Musculoskeletal:  Negative for arthralgias and myalgias.  Skin:  Negative for rash.  Allergic/Immunologic: Negative.   Neurological:  Positive for light-headedness. Negative for dizziness, weakness and headaches.  Hematological: Negative.   Psychiatric/Behavioral:  Negative for dysphoric mood. The patient is not nervous/anxious.     Current Outpatient Medications on File Prior to Visit  Medication Sig Dispense Refill   acetaZOLAMIDE   (DIAMOX ) 250 MG tablet Take 1 tablet (250 mg total) by mouth 2 (two) times daily.     ALPRAZolam  (XANAX ) 0.25 MG tablet TAKE 1 TABLET BY MOUTH AT BEDTIME AS NEEDED FOR ANXIETY. 30 tablet 4   D-Mannose 500 MG CAPS Take 2 capsules by mouth daily.     estradiol (ESTRACE) 0.1 MG/GM vaginal cream Place 1 Applicatorful vaginally daily.     ezetimibe  (ZETIA ) 10 MG tablet TAKE 1 TABLET BY MOUTH DAILY. 90 tablet 2   hydrochlorothiazide  (HYDRODIURIL ) 25 MG tablet TAKE 1 TABLET BY MOUTH DAILY. 90 tablet 0   levothyroxine  (SYNTHROID ) 50 MCG tablet Take 1 tablet (50 mcg total) by mouth daily. 90 tablet 2   nebivolol  (BYSTOLIC ) 5 MG tablet Take 1 tablet (5 mg total) by mouth daily. 90 tablet 2   rosuvastatin  (CRESTOR ) 5 MG tablet TAKE 1 TABLET BY MOUTH 3 TIMES A WEEK 36 tablet 1   No current facility-administered medications on file prior to visit.   Past Medical History:  Diagnosis Date   Anxiety disorder 07/19/2019   Cancer (HCC) 1986   Ovarian   Cataract 2010   Chronic back pain 03/10/2018   Depression    Dyslipidemia 07/28/2019   Dyspnea on exertion 03/19/2019   Essential hypertension 03/19/2019   History of ovarian cancer    Hyperlipidemia 04/2015   Hypertension 02/2019   Hypothyroidism 03/10/2018   Impingement syndrome of right shoulder 05/12/2014   Insomnia due to other mental disorder 07/19/2019   Lumbar back pain 07/20/2019   Mixed hyperlipidemia 03/19/2019   Primary cough headache 03/21/2021   Right shoulder pain 05/12/2014   Right upper quadrant abdominal tenderness without rebound tenderness 01/29/2023   Sciatic nerve pain, left 07/19/2019   Sciatic nerve pain, right 07/19/2019   UTI (urinary tract infection)    Past Surgical History:  Procedure Laterality Date   ABDOMINAL HYSTERECTOMY  1986   Total, also had chemo   APPENDECTOMY  1986   ESOPHAGOGASTRODUODENOSCOPY     years ago   EYE SURGERY  2010   Cataract surgery   LAPAROTOMY  1987   To rule out recurrent ovarian  cancer   TONSILLECTOMY      Family History  Problem Relation Age of Onset   Congestive Heart Failure Mother    Osteoarthritis Mother    Neuropathy Father    Esophageal cancer Neg Hx    Colon cancer Neg Hx    Social History   Socioeconomic History   Marital status: Married    Spouse name: Todd   Number of children: Not on file   Years of education: Not on file   Highest education level: Bachelor's degree (e.g., BA, AB, BS)  Occupational History   Not on file  Tobacco Use   Smoking status: Never   Smokeless tobacco: Never  Vaping Use   Vaping status: Never Used  Substance and Sexual Activity   Alcohol use: Yes    Alcohol/week: 1.0 standard drink of alcohol    Types: 1 Glasses of wine per week    Comment: occasionally   Drug use: Not Currently    Types: Solvent inhalants   Sexual activity: Yes    Partners: Male    Birth control/protection: None  Other Topics Concern   Not on file  Social History Narrative   Are you right handed or left handed? Right   Are you currently employed ? Retired   What is your current occupation?   Do you live at home alone? N   Who lives with you? Husband   What type of home do you live in: 1 story or 2 story? 1       Social Drivers of Corporate Investment Banker Strain: Low Risk  (09/25/2023)   Overall Financial Resource Strain (CARDIA)    Difficulty of Paying Living Expenses: Not hard at all  Food Insecurity: No Food Insecurity (09/25/2023)   Hunger Vital Sign    Worried About Running Out of Food in the Last Year: Never true    Ran Out of Food in the Last Year: Never true  Transportation Needs: No Transportation Needs (09/25/2023)   PRAPARE - Administrator, Civil Service (Medical): No    Lack of Transportation (Non-Medical): No  Physical Activity: Insufficiently Active (09/25/2023)   Exercise Vital Sign    Days of Exercise per Week: 2 days    Minutes of Exercise per Session: 20 min  Stress: No Stress Concern Present  (09/25/2023)   [employer] of Occupational Health - Occupational Stress Questionnaire    Feeling of Stress: Only a little  Social Connections: Socially Integrated (09/25/2023)   Social Connection and Isolation Panel    Frequency of Communication with Friends and Family: More than three times a week    Frequency of Social Gatherings with Friends and Family: More than three times a week    Attends Religious Services: More than 4 times per year    Active Member of [employer] or Organizations: Yes    Attends Engineer, Structural: More than 4 times per year    Marital Status: Married    Objective:  BP 108/66   Pulse 68   Temp 98 F (36.7 C) (Temporal)   Resp 16   Ht 5' 3 (1.6 m)   Wt 117 lb 12.8 oz (53.4 kg)   LMP  (LMP Unknown)   SpO2 100%   BMI 20.87 kg/m      12/08/2023    8:06 AM 10/07/2023   10:44 AM 09/29/2023    9:29 AM  BP/Weight  Systolic BP 108 124 110  Diastolic BP 66 80 70  Wt. (Lbs) 117.8 119 118.4  BMI 20.87 kg/m2 21.08 kg/m2 20.97 kg/m2    Physical Exam Vitals reviewed.  Constitutional:      Appearance: Normal appearance. She is normal weight.  Neck:     Vascular: No carotid bruit.  Cardiovascular:     Rate and Rhythm: Normal rate and regular rhythm.     Heart sounds: Normal heart sounds.  Pulmonary:     Effort: Pulmonary effort is normal. No respiratory distress.     Breath sounds: Normal breath sounds.  Abdominal:     General: Abdomen is flat. Bowel sounds are normal.     Palpations: Abdomen is soft.     Tenderness: There is no abdominal tenderness.  Neurological:  Mental Status: She is alert and oriented to person, place, and time.  Psychiatric:        Mood and Affect: Mood normal.        Behavior: Behavior normal.         Lab Results  Component Value Date   WBC 6.0 08/04/2023   HGB 14.1 08/04/2023   HCT 42.2 08/04/2023   PLT 336 08/04/2023   GLUCOSE 109 (H) 08/04/2023   CHOL 174 08/04/2023   TRIG 84 08/04/2023   HDL 71  08/04/2023   LDLCALC 88 08/04/2023   ALT 9 08/04/2023   AST 16 08/04/2023   NA 139 08/04/2023   K 3.7 08/04/2023   CL 102 08/04/2023   CREATININE 0.72 08/04/2023   BUN 18 08/04/2023   CO2 23 08/04/2023   TSH 2.330 08/04/2023   HGBA1C 5.6 12/08/2023    Results for orders placed or performed in visit on 12/08/23  POCT glycosylated hemoglobin (Hb A1C)   Collection Time: 12/08/23  8:33 AM  Result Value Ref Range   Hemoglobin A1C 5.6 4.0 - 5.6 %   HbA1c POC (<> result, manual entry)     HbA1c, POC (prediabetic range)     HbA1c, POC (controlled diabetic range)    .  Assessment & Plan:   Assessment & Plan Acquired hypothyroidism Well-managed on levothyroxine  50 mcg daily. Dissatisfaction with current endocrinologist. - I have agreed to managing thyroid  monitoring in this practice.    Essential hypertension Well-controlled on hydrochlorothiazide  25 mg daily and Bystolic  5 mg daily. Blood pressure stable.    Mixed hyperlipidemia Well-managed on Zetia  10 mg daily and rosuvastatin  5 mg three times a week.    Supraventricular tachycardia Well-controlled on Bystolic  5 mg daily.    Prediabetes Recommend continue to work on eating healthy diet and exercise.  Orders:   POCT glycosylated hemoglobin (Hb A1C)  Paresthesia Chronic tingling and cold sensation in hands, possibly related to Diamox  use. No color changes in extremities. - Check vitamin levels (D, B12, folate).  Orders:   CBC with Differential/Platelet   Comprehensive metabolic panel with GFR   B12 and Folate Panel   Methylmalonic acid, serum  Other fatigue Chronic tingling and cold sensation in hands, possibly related to Diamox  use. No color changes in extremities. - Check vitamin levels (D, B12, folate). - consider sleep study  Orders:   CBC with Differential/Platelet   Comprehensive metabolic panel with GFR   MR Abdomen W Wo Contrast; Future  Liver lesion, right lobe Order mri  Orders:   MR Abdomen W  Wo Contrast; Future  Weight loss Order mri  Orders:   MR Abdomen W Wo Contrast; Future  RUQ abdominal pain Mri of abd ordered.      Body mass index is 20.87 kg/m.    No orders of the defined types were placed in this encounter.   Orders Placed This Encounter  Procedures   MR Abdomen W Wo Contrast   CBC with Differential/Platelet   Comprehensive metabolic panel with GFR   B12 and Folate Panel   Methylmalonic acid, serum   POCT glycosylated hemoglobin (Hb A1C)       Follow-up: Return in about 3 months (around 03/09/2024) for chronic follow up.  An After Visit Summary was printed and given to the patient.  Abigail Free, MD [facility] [phone] "

## 2023-12-07 NOTE — Assessment & Plan Note (Signed)
 [name]

## 2023-12-07 NOTE — Assessment & Plan Note (Signed)
 Dana [name]

## 2023-12-08 ENCOUNTER — Ambulatory Visit (INDEPENDENT_AMBULATORY_CARE_PROVIDER_SITE_OTHER): Admitting: Family Medicine

## 2023-12-08 ENCOUNTER — Encounter: Payer: Self-pay | Admitting: Family Medicine

## 2023-12-08 VITALS — BP 108/66 | HR 68 | Temp 98.0°F | Resp 16 | Ht 63.0 in | Wt 117.8 lb

## 2023-12-08 DIAGNOSIS — R7303 Prediabetes: Secondary | ICD-10-CM | POA: Diagnosis not present

## 2023-12-08 DIAGNOSIS — E782 Mixed hyperlipidemia: Secondary | ICD-10-CM

## 2023-12-08 DIAGNOSIS — R202 Paresthesia of skin: Secondary | ICD-10-CM | POA: Insufficient documentation

## 2023-12-08 DIAGNOSIS — R5383 Other fatigue: Secondary | ICD-10-CM | POA: Diagnosis not present

## 2023-12-08 DIAGNOSIS — I10 Essential (primary) hypertension: Secondary | ICD-10-CM | POA: Diagnosis not present

## 2023-12-08 DIAGNOSIS — E039 Hypothyroidism, unspecified: Secondary | ICD-10-CM | POA: Diagnosis not present

## 2023-12-08 DIAGNOSIS — R634 Abnormal weight loss: Secondary | ICD-10-CM

## 2023-12-08 DIAGNOSIS — K769 Liver disease, unspecified: Secondary | ICD-10-CM | POA: Insufficient documentation

## 2023-12-08 DIAGNOSIS — F419 Anxiety disorder, unspecified: Secondary | ICD-10-CM

## 2023-12-08 DIAGNOSIS — R1011 Right upper quadrant pain: Secondary | ICD-10-CM

## 2023-12-08 DIAGNOSIS — I471 Supraventricular tachycardia, unspecified: Secondary | ICD-10-CM

## 2023-12-08 LAB — POCT GLYCOSYLATED HEMOGLOBIN (HGB A1C): Hemoglobin A1C: 5.6 % (ref 4.0–5.6)

## 2023-12-08 NOTE — Assessment & Plan Note (Signed)
 Chronic tingling and cold sensation in hands, possibly related to Diamox  use. No color changes in extremities. - Check vitamin levels (D, B12, folate).  Orders:   CBC with Differential/Platelet   Comprehensive metabolic panel with GFR   B12 and Folate Panel   Methylmalonic acid, serum

## 2023-12-08 NOTE — Assessment & Plan Note (Signed)
 Chronic tingling and cold sensation in hands, possibly related to Diamox  use. No color changes in extremities. - Check vitamin levels (D, B12, folate). - consider sleep study  Orders:   CBC with Differential/Platelet   Comprehensive metabolic panel with GFR   MR Abdomen W Wo Contrast; Future

## 2023-12-08 NOTE — Patient Instructions (Addendum)
  VISIT SUMMARY: Today, we addressed your persistent fatigue, right-sided abdominal pain, sleep disturbance, unintentional weight loss, and tingling in your hands and feet. We also reviewed your management for hypothyroidism, hyperlipidemia, and hypertension.  YOUR PLAN: RIGHT UPPER ABDOMINAL AND BACK PAIN WITH UNINTENTIONAL WEIGHT LOSS AND FATIGUE: You have been experiencing sharp pain in your right upper abdomen that radiates to your back, along with significant weight loss and fatigue. -We will check your vitamin D, B12, and folate levels. -If your symptoms persist, we may consider a sleep study.  PARESTHESIA OF HANDS AND FEET: You have tingling and cold sensations in your hands, which may be related to your use of Diamox  for headaches. -We will check your vitamin D, B12, and folate levels. -Please follow up with your headache specialist.  HYPOTHYROIDISM: Your hypothyroidism is currently well-managed with levothyroxine  50 mcg daily, but you are dissatisfied with your current endocrinologist. -We can consider managing your thyroid  monitoring in this practice.  MIXED HYPERLIPIDEMIA: Your cholesterol levels are well-managed with Zetia  10 mg daily and rosuvastatin  5 mg three times a week. -Continue taking Zetia  10 mg daily and rosuvastatin  5 mg three times a week as prescribed.  ESSENTIAL HYPERTENSION: Your blood pressure is well-controlled with hydrochlorothiazide  25 mg daily and Bystolic  5 mg daily. -Continue taking hydrochlorothiazide  25 mg daily and Bystolic  5 mg daily as prescribed.  ABDOMINAL PAIN: ORDERING REPEAT MRI.   ORDERING SLEEP STUDY IF LABS NORMAL.                     Contains text generated by [employer].                                 Contains text generated by [employer].

## 2023-12-08 NOTE — Assessment & Plan Note (Signed)
 Order mri  Orders:   MR Abdomen W Wo Contrast; Future

## 2023-12-09 ENCOUNTER — Telehealth: Payer: Self-pay

## 2023-12-09 NOTE — Telephone Encounter (Signed)
 Good morning, Can you look on this, please?  Copied from CRM [id number]. Topic: Clinical - Medication Prior Auth >> Dec 08, 2023  5:30 PM Delon DASEN wrote: Reason for CRM: Carina with  [facility]- PA for MRI of abdomen- need additional information of prior imaging results- phone [phone] prompts for nurse reviewer

## 2023-12-10 ENCOUNTER — Ambulatory Visit (HOSPITAL_BASED_OUTPATIENT_CLINIC_OR_DEPARTMENT_OTHER)
Admission: RE | Admit: 2023-12-10 | Discharge: 2023-12-10 | Disposition: A | Source: Ambulatory Visit | Attending: Family Medicine | Admitting: Family Medicine

## 2023-12-10 ENCOUNTER — Ambulatory Visit: Payer: Self-pay | Admitting: Family Medicine

## 2023-12-10 DIAGNOSIS — D1803 Hemangioma of intra-abdominal structures: Secondary | ICD-10-CM | POA: Diagnosis not present

## 2023-12-10 DIAGNOSIS — Z8543 Personal history of malignant neoplasm of ovary: Secondary | ICD-10-CM | POA: Diagnosis not present

## 2023-12-10 DIAGNOSIS — K769 Liver disease, unspecified: Secondary | ICD-10-CM

## 2023-12-10 DIAGNOSIS — R634 Abnormal weight loss: Secondary | ICD-10-CM | POA: Diagnosis not present

## 2023-12-10 DIAGNOSIS — R5383 Other fatigue: Secondary | ICD-10-CM

## 2023-12-10 MED ORDER — GADOBUTROL 1 MMOL/ML IV SOLN
5.3000 mL | Freq: Once | INTRAVENOUS | Status: AC | PRN
  Administered 2023-12-10: 5.3 mL via INTRAVENOUS

## 2023-12-10 MED ORDER — GADOPICLENOL 0.5 MMOL/ML IV SOLN: 5.3000 mL | Freq: Once | INTRAVENOUS | Status: DC | PRN

## 2023-12-11 ENCOUNTER — Other Ambulatory Visit (HOSPITAL_BASED_OUTPATIENT_CLINIC_OR_DEPARTMENT_OTHER): Admitting: Radiology

## 2023-12-14 LAB — COMPREHENSIVE METABOLIC PANEL WITH GFR
ALT: 11 IU/L (ref 0–32)
AST: 16 IU/L (ref 0–40)
Albumin: 4.3 g/dL (ref 3.8–4.8)
Alkaline Phosphatase: 61 IU/L (ref 49–135)
BUN/Creatinine Ratio: 20 (ref 12–28)
BUN: 17 mg/dL (ref 8–27)
Bilirubin Total: 0.6 mg/dL (ref 0.0–1.2)
CO2: 24 mmol/L (ref 20–29)
Calcium: 10 mg/dL (ref 8.7–10.3)
Chloride: 104 mmol/L (ref 96–106)
Creatinine, Ser: 0.87 mg/dL (ref 0.57–1.00)
Globulin, Total: 2 g/dL (ref 1.5–4.5)
Glucose: 107 mg/dL — ABNORMAL HIGH (ref 70–99)
Potassium: 3.5 mmol/L (ref 3.5–5.2)
Sodium: 141 mmol/L (ref 134–144)
Total Protein: 6.3 g/dL (ref 6.0–8.5)
eGFR: 69 mL/min/1.73 (ref >59)

## 2023-12-14 LAB — CBC WITH DIFFERENTIAL/PLATELET
Basophils Absolute: 0 x10E3/uL (ref 0.0–0.2)
Basos: 0 %
EOS (ABSOLUTE): 0.1 x10E3/uL (ref 0.0–0.4)
Eos: 2 %
Hematocrit: 45 % (ref 34.0–46.6)
Hemoglobin: 14.6 g/dL (ref 11.1–15.9)
Immature Grans (Abs): 0 x10E3/uL (ref 0.0–0.1)
Immature Granulocytes: 0 %
Lymphocytes Absolute: 1.7 x10E3/uL (ref 0.7–3.1)
Lymphs: 28 %
MCH: 30.8 pg (ref 26.6–33.0)
MCHC: 32.4 g/dL (ref 31.5–35.7)
MCV: 95 fL (ref 79–97)
Monocytes Absolute: 0.4 x10E3/uL (ref 0.1–0.9)
Monocytes: 6 %
Neutrophils Absolute: 3.7 x10E3/uL (ref 1.4–7.0)
Neutrophils: 64 %
Platelets: 341 x10E3/uL (ref 150–450)
RBC: 4.74 x10E6/uL (ref 3.77–5.28)
RDW: 12.3 % (ref 11.7–15.4)
WBC: 5.9 x10E3/uL (ref 3.4–10.8)

## 2023-12-14 LAB — B12 AND FOLATE PANEL
Folate: 9 ng/mL (ref >3.0)
Vitamin B-12: 266 pg/mL (ref 232–1245)

## 2023-12-14 LAB — METHYLMALONIC ACID, SERUM: Methylmalonic Acid: 143 nmol/L (ref 0–378)

## 2023-12-15 DIAGNOSIS — N390 Urinary tract infection, site not specified: Secondary | ICD-10-CM | POA: Diagnosis not present

## 2023-12-19 NOTE — Progress Notes (Deleted)
 NEUROLOGY FOLLOW UP OFFICE NOTE  [name] [id number]  Assessment/Plan:   Primary cough headache  Acetazolamide  250mg  once daily May use Excedrin Migraine generic as needed.  Limit use of pain relievers to no more than 2 days out of week to prevent risk of rebound or medication-overuse headache. Follow up ***   Subjective:  Dana [name] is a 78 year old right-handed female with HTN, hypothyroidism, mitral valve regurgitation, HLD, anxiety, chronic low back pain and history of ovarian cancer who follows up for primary cough headache.    UPDATE: Current medications:  acetazolamide  250mg  twice daily, Excedrin   ***  12/08/2023 LABS:  CMP with Na 141, K 3.5, Cl 104, CO2 24, Ca 10, glucose 107, BUN 17, Cr 0.87, GFR 69, t bili 0.6, ALP 61, AST 16, ALT 11; CBC with WBC 5.9, HGB 14.6, HCT 45, PLT 341  HISTORY: In 2022, she developed a new headache.  Whenever she would cough, sneeze or bend over, she would experience a severe sharp pain in the left frontal/periorbital region radiating up to the left side of her scalp.  It would last about 20 minutes and subside.  Some associated photophobia but no neck pain, nausea, vomiting, phonophobia, dizziness or visual disturbance. She developed a respiratory infection at that time, so the headaches were frequent because she was frequently coughing.  MRI of brain with and without contrast on 03/22/2021 personally reviewed was unremarkable except for few nonspecific T2 hyperintense punctate foci within the cerebral white matter (not unusual for age) and single chronic microhemorrhage in the right centrum semiovale but no evidence of Chiari malformation or mass lesion.  She had a frequent cough for about a couple of months and then it subsided.  Whenever she would cough since then, it would cause the headache but it only occurred once in awhile.  She recently developed a new nagging cough and is now experiencing the same headaches again.  CTA Head on  01/03/2023 revealed minimal stenosis at the right MCA bifurcation and ordinary mild atherosclerotic calcification of the carotid siphon regions but otherwise no evidence of aneurysm, LVO, significant stenosis or vascular malformation.    She has history of ocular migraines without headache but no prior history of headaches.  PAST MEDICAL HISTORY: Past Medical History:  Diagnosis Date   Anxiety disorder 07/19/2019   Cancer (HCC) 1986   Ovarian   Cataract 2010   Chronic back pain 03/10/2018   Depression    Dyslipidemia 07/28/2019   Dyspnea on exertion 03/19/2019   Essential hypertension 03/19/2019   History of ovarian cancer    Hyperlipidemia 04/2015   Hypertension 02/2019   Hypothyroidism 03/10/2018   Impingement syndrome of right shoulder 05/12/2014   Insomnia due to other mental disorder 07/19/2019   Lumbar back pain 07/20/2019   Mixed hyperlipidemia 03/19/2019   Primary cough headache 03/21/2021   Right shoulder pain 05/12/2014   Right upper quadrant abdominal tenderness without rebound tenderness 01/29/2023   Sciatic nerve pain, left 07/19/2019   Sciatic nerve pain, right 07/19/2019   UTI (urinary tract infection)     MEDICATIONS: Current Outpatient Medications on File Prior to Visit  Medication Sig Dispense Refill   acetaZOLAMIDE  (DIAMOX ) 250 MG tablet Take 1 tablet (250 mg total) by mouth 2 (two) times daily.     ALPRAZolam  (XANAX ) 0.25 MG tablet TAKE 1 TABLET BY MOUTH AT BEDTIME AS NEEDED FOR ANXIETY. 30 tablet 4   D-Mannose 500 MG CAPS Take 2 capsules by mouth daily.  estradiol (ESTRACE) 0.1 MG/GM vaginal cream Place 1 Applicatorful vaginally daily.     ezetimibe  (ZETIA ) 10 MG tablet TAKE 1 TABLET BY MOUTH DAILY. 90 tablet 2   hydrochlorothiazide  (HYDRODIURIL ) 25 MG tablet TAKE 1 TABLET BY MOUTH DAILY. 90 tablet 0   levothyroxine  (SYNTHROID ) 50 MCG tablet Take 1 tablet (50 mcg total) by mouth daily. 90 tablet 2   nebivolol  (BYSTOLIC ) 5 MG tablet Take 1 tablet (5 mg  total) by mouth daily. 90 tablet 2   rosuvastatin  (CRESTOR ) 5 MG tablet TAKE 1 TABLET BY MOUTH 3 TIMES A WEEK 36 tablet 1   No current facility-administered medications on file prior to visit.    ALLERGIES: Allergies  Allergen Reactions   Lipitor [Atorvastatin]     myalgia   Penicillamine     Other reaction(s): Not available   Topamax  [Topiramate ] Other (See Comments)    BP decreased   Penicillins Rash    Other reaction(s): RASH    Pravastatin Other (See Comments)    Myalgia    FAMILY HISTORY: Family History  Problem Relation Age of Onset   Congestive Heart Failure Mother    Osteoarthritis Mother    Neuropathy Father    Esophageal cancer Neg Hx    Colon cancer Neg Hx       Objective:  *** General: No acute distress.  Patient appears well-groomed.   Head:  Normocephalic/atraumatic Neck:  Supple.  No paraspinal tenderness.  Full range of motion. Heart:  Regular rate and rhythm. Neuro:  Alert and oriented.  Speech fluent and not dysarthric.  Language intact.  CN II-XII intact.  Bulk and tone normal.  Muscle strength 5/5 throughout.  Sensation to light touch intact.  Deep tendon reflexes 2+ throughout, toes downgoing.  Gait normal.  Romberg negative.    Juliene Dunnings, DO  CC: Abigail Free, MD

## 2023-12-22 ENCOUNTER — Ambulatory Visit: Admitting: Neurology

## 2023-12-27 ENCOUNTER — Other Ambulatory Visit: Payer: Self-pay | Admitting: Family Medicine

## 2024-01-12 DIAGNOSIS — K080 Exfoliation of teeth due to systemic causes: Secondary | ICD-10-CM | POA: Diagnosis not present

## 2024-01-23 ENCOUNTER — Other Ambulatory Visit: Payer: Self-pay | Admitting: Family Medicine

## 2024-01-23 DIAGNOSIS — F419 Anxiety disorder, unspecified: Secondary | ICD-10-CM

## 2024-02-02 DIAGNOSIS — N390 Urinary tract infection, site not specified: Secondary | ICD-10-CM | POA: Diagnosis not present

## 2024-02-02 DIAGNOSIS — N952 Postmenopausal atrophic vaginitis: Secondary | ICD-10-CM | POA: Diagnosis not present

## 2024-02-12 ENCOUNTER — Other Ambulatory Visit: Payer: Self-pay | Admitting: Neurology

## 2024-03-16 ENCOUNTER — Other Ambulatory Visit: Payer: Self-pay | Admitting: Cardiology

## 2024-03-16 ENCOUNTER — Other Ambulatory Visit: Payer: Self-pay | Admitting: Family Medicine

## 2024-03-16 DIAGNOSIS — E782 Mixed hyperlipidemia: Secondary | ICD-10-CM

## 2024-03-16 MED ORDER — NEBIVOLOL HCL 5 MG PO TABS: 5.0000 mg | ORAL_TABLET | Freq: Every day | ORAL | 2 refills | Status: AC

## 2024-03-17 NOTE — Progress Notes (Unsigned)
 "  NEUROLOGY FOLLOW UP OFFICE NOTE  Dana [name] [id number]  Assessment/Plan:   Primary cough headache  As she has been doing well for a while now, will discontinue acetazolamide  250mg  once daily If headaches return, she will contact me, restart the acetazolamide  and have her follow up.   Subjective:  Dana [name] is a 79 year old right-handed female with HTN, hypothyroidism, mitral valve regurgitation, HLD, anxiety, chronic low back pain and history of ovarian cancer who follows up for primary cough headache.    UPDATE: Taking acetazolamide  250mg  twice daily  12/08/2023 LABS:  CMP with Na 141, K 3.5, Cl 104, CO2 24, Ca 10, glucose 107, BUN 17, Cr 0.87  No recurrent headache.  HISTORY: In 2022, she developed a new headache.  Whenever she would cough, sneeze or bend over, she would experience a severe sharp pain in the left frontal/periorbital region radiating up to the left side of her scalp.  It would last about 20 minutes and subside.  Some associated photophobia but no neck pain, nausea, vomiting, phonophobia, dizziness or visual disturbance. She developed a respiratory infection at that time, so the headaches were frequent because she was frequently coughing.  MRI of brain with and without contrast on 03/22/2021 personally reviewed was unremarkable except for few nonspecific T2 hyperintense punctate foci within the cerebral white matter (not unusual for age) and single chronic microhemorrhage in the right centrum semiovale but no evidence of Chiari malformation or mass lesion.  She had a frequent cough for about a couple of months and then it subsided.  Whenever she would cough since then, it would cause the headache but it only occurred once in awhile.  She recently developed a new nagging cough and is now experiencing the same headaches again.  She has been taking generic Excedrin Migraine as needed, which has helped.  CTA Head on 01/03/2023 revealed minimal stenosis at the right  MCA bifurcation and ordinary mild atherosclerotic calcification of the carotid siphon regions but otherwise no evidence of aneurysm, LVO, significant stenosis or vascular malformation.    She has history of ocular migraines without headache but no prior history of headaches.  PAST MEDICAL HISTORY: Past Medical History:  Diagnosis Date   Anxiety disorder 07/19/2019   Cancer (HCC) 1986   Ovarian   Cataract 2010   Chronic back pain 03/10/2018   Depression    Dyslipidemia 07/28/2019   Dyspnea on exertion 03/19/2019   Essential hypertension 03/19/2019   History of ovarian cancer    Hyperlipidemia 04/2015   Hypertension 02/2019   Hypothyroidism 03/10/2018   Impingement syndrome of right shoulder 05/12/2014   Insomnia due to other mental disorder 07/19/2019   Lumbar back pain 07/20/2019   Mixed hyperlipidemia 03/19/2019   Primary cough headache 03/21/2021   Right shoulder pain 05/12/2014   Right upper quadrant abdominal tenderness without rebound tenderness 01/29/2023   Sciatic nerve pain, left 07/19/2019   Sciatic nerve pain, right 07/19/2019   UTI (urinary tract infection)     MEDICATIONS: Current Outpatient Medications on File Prior to Visit  Medication Sig Dispense Refill   acetaZOLAMIDE  (DIAMOX ) 250 MG tablet TAKE 1 TABLET BY MOUTH 2 TIMES DAILY. 60 tablet 4   ALPRAZolam  (XANAX ) 0.25 MG tablet TAKE 1 TABLET BY MOUTH AT BEDTIME AS NEEDED FOR ANXIETY. 30 tablet 3   D-Mannose 500 MG CAPS Take 2 capsules by mouth daily.     estradiol (ESTRACE) 0.1 MG/GM vaginal cream Place 1 Applicatorful vaginally daily.  ezetimibe  (ZETIA ) 10 MG tablet TAKE 1 TABLET BY MOUTH DAILY. 90 tablet 1   hydrochlorothiazide  (HYDRODIURIL ) 25 MG tablet TAKE 1 TABLET BY MOUTH DAILY. 90 tablet 1   levothyroxine  (SYNTHROID ) 50 MCG tablet Take 1 tablet (50 mcg total) by mouth daily. 90 tablet 2   nebivolol  (BYSTOLIC ) 5 MG tablet Take 1 tablet (5 mg total) by mouth daily. 90 tablet 2   rosuvastatin  (CRESTOR )  5 MG tablet TAKE 1 TABLET BY MOUTH 3 TIMES A WEEK 36 tablet 1   No current facility-administered medications on file prior to visit.    ALLERGIES: Allergies  Allergen Reactions   Lipitor [Atorvastatin]     myalgia   Penicillamine     Other reaction(s): Not available   Topamax  [Topiramate ] Other (See Comments)    BP decreased   Penicillins Rash    Other reaction(s): RASH    Pravastatin Other (See Comments)    Myalgia    FAMILY HISTORY: Family History  Problem Relation Age of Onset   Congestive Heart Failure Mother    Osteoarthritis Mother    Neuropathy Father    Esophageal cancer Neg Hx    Colon cancer Neg Hx       Objective:  Blood pressure 126/78, pulse 74, height 5' 3 (1.6 m), weight 123 lb 9.6 oz (56.1 kg), SpO2 97%. General: No acute distress.  Patient appears well-groomed.   Head:  Normocephalic/atraumatic Neck:  Supple.  No paraspinal tenderness.  Full range of motion. Heart:  Regular rate and rhythm. Neuro:  Alert and oriented.  Speech fluent and not dysarthric.  Language intact.  CN II-XII intact.  Bulk and tone normal.  Muscle strength 5/5 throughout.  Sensation to light touch intact.  Deep tendon reflexes 2+ throughout, toes downgoing.  Gait normal.  Romberg negative.    Juliene Dunnings, DO  CC: Abigail Free, MD       "

## 2024-03-18 ENCOUNTER — Ambulatory Visit: Admitting: Family Medicine

## 2024-03-18 ENCOUNTER — Ambulatory Visit: Admitting: Neurology

## 2024-03-18 ENCOUNTER — Encounter: Payer: Self-pay | Admitting: Neurology

## 2024-03-18 ENCOUNTER — Encounter: Payer: Self-pay | Admitting: Family Medicine

## 2024-03-18 VITALS — BP 124/72 | HR 66 | Temp 98.3°F | Ht 63.0 in | Wt 124.0 lb

## 2024-03-18 VITALS — BP 126/78 | HR 74 | Ht 63.0 in | Wt 123.6 lb

## 2024-03-18 DIAGNOSIS — E039 Hypothyroidism, unspecified: Secondary | ICD-10-CM

## 2024-03-18 DIAGNOSIS — I10 Essential (primary) hypertension: Secondary | ICD-10-CM

## 2024-03-18 DIAGNOSIS — L853 Xerosis cutis: Secondary | ICD-10-CM

## 2024-03-18 DIAGNOSIS — G4483 Primary cough headache: Secondary | ICD-10-CM | POA: Diagnosis not present

## 2024-03-18 DIAGNOSIS — R7303 Prediabetes: Secondary | ICD-10-CM

## 2024-03-18 DIAGNOSIS — E782 Mixed hyperlipidemia: Secondary | ICD-10-CM

## 2024-03-18 LAB — COMPREHENSIVE METABOLIC PANEL WITH GFR
ALT: 12 IU/L (ref 0–32)
AST: 22 IU/L (ref 0–40)
Albumin: 4.5 g/dL (ref 3.8–4.8)
Alkaline Phosphatase: 63 IU/L (ref 49–135)
BUN/Creatinine Ratio: 16 (ref 12–28)
BUN: 14 mg/dL (ref 8–27)
Bilirubin Total: 0.8 mg/dL (ref 0.0–1.2)
CO2: 24 mmol/L (ref 20–29)
Calcium: 10.5 mg/dL — ABNORMAL HIGH (ref 8.7–10.3)
Chloride: 102 mmol/L (ref 96–106)
Creatinine, Ser: 0.87 mg/dL (ref 0.57–1.00)
Globulin, Total: 1.9 g/dL (ref 1.5–4.5)
Glucose: 100 mg/dL — ABNORMAL HIGH (ref 70–99)
Potassium: 4.4 mmol/L (ref 3.5–5.2)
Sodium: 141 mmol/L (ref 134–144)
Total Protein: 6.4 g/dL (ref 6.0–8.5)
eGFR: 69 mL/min/1.73

## 2024-03-18 LAB — POCT GLYCOSYLATED HEMOGLOBIN (HGB A1C): HbA1c POC (<> result, manual entry): 5.6 %

## 2024-03-18 LAB — CBC WITH DIFFERENTIAL/PLATELET
Basophils Absolute: 0.1 x10E3/uL (ref 0.0–0.2)
Basos: 1 %
EOS (ABSOLUTE): 0.1 x10E3/uL (ref 0.0–0.4)
Eos: 2 %
Hematocrit: 42.8 % (ref 34.0–46.6)
Hemoglobin: 14.5 g/dL (ref 11.1–15.9)
Immature Grans (Abs): 0 x10E3/uL (ref 0.0–0.1)
Immature Granulocytes: 0 %
Lymphocytes Absolute: 1.5 x10E3/uL (ref 0.7–3.1)
Lymphs: 27 %
MCH: 31.7 pg (ref 26.6–33.0)
MCHC: 33.9 g/dL (ref 31.5–35.7)
MCV: 93 fL (ref 79–97)
Monocytes Absolute: 0.4 x10E3/uL (ref 0.1–0.9)
Monocytes: 8 %
Neutrophils Absolute: 3.6 x10E3/uL (ref 1.4–7.0)
Neutrophils: 62 %
Platelets: 312 x10E3/uL (ref 150–450)
RBC: 4.58 x10E6/uL (ref 3.77–5.28)
RDW: 13 % (ref 11.7–15.4)
WBC: 5.7 x10E3/uL (ref 3.4–10.8)

## 2024-03-18 LAB — POCT LIPID PANEL
HDL: 90
LDL: 97
Non-HDL: 108
TC: 198
TRG: 58

## 2024-03-18 NOTE — Progress Notes (Signed)
 "  Subjective:  Patient ID: Dana [name], female    DOB: November 18, 1945  Age: 79 y.o. MRN: [id number]  Chief Complaint  Patient presents with   Medical Management of Chronic Issues    HPI: Discussed the use of AI scribe software for clinical note transcription with the patient, who gave verbal consent to proceed.  History of Present Illness Dana [name] is a 79 year old female who presents with dry, cracked skin on her thumb.  Cutaneous symptoms - Dry, cracked skin on thumb with painful split - Application of camphofenic causes burning sensation - Skin becomes extremely dry every winter, leading to similar issues  Weight loss and fatigue - Significant weight loss and fatigue from October 2024 to October 2025. No etiology found.  - Unable to gain weight during that period, felt 'miserable' - Recent improvement with 10-pound weight gain, currently feels well - Weight gain partially attributed to recent cruise - No changes in diet or use of supplements during this time  Headache history - History of headaches previously treated with acetazolamide  - Headaches described as 'low throb' escalating to migraine if untreated - Headaches resolved in December, acetazolamide  discontinued - No current headaches  Lower extremity edema - Significant swelling in feet during eight-hour flight despite wearing compression stockings - Swelling managed by taking an extra dose of hydrochlorothiazide  - No current pain - Bowel and bladder functions are normal  General constitutional symptoms - No fever, chills, sweats, earaches, sore throat, stuffy nose, or headaches  Physical activity - Active, works out at a 24-hour gym - Participates in [employer] program       09/29/2023    9:37 AM 08/04/2023    9:30 AM 05/01/2023    9:53 AM 01/29/2023    9:29 AM 09/24/2022    8:14 AM  Depression screen PHQ 2/9  Decreased Interest 0 0 0 0 0  Down, Depressed, Hopeless 0 0 0 0 0  PHQ - 2 Score 0 0 0 0  0  Altered sleeping 0 0 1 2 1   Tired, decreased energy 1 1 3 3  0  Change in appetite 0 0 1 3 0  Feeling bad or failure about yourself  0 0 0 0 0  Trouble concentrating 0 0 0 2 1  Moving slowly or fidgety/restless 0 0 0 3 0  Suicidal thoughts 0 0 0 0 0  PHQ-9 Score 1  1  5  13  2    Difficult doing work/chores Not difficult at all Not difficult at all Somewhat difficult Not difficult at all Not difficult at all     Data saved with a previous flowsheet row definition        03/18/2024   11:30 AM  Fall Risk   Falls in the past year? 0  Number falls in past yr: 0  Injury with Fall? 0  Risk for fall due to : No Fall Risks  Follow up Falls evaluation completed    Patient Care Team: Sherre Clapper, MD as PCP - General (Family Medicine) Bernie Lamar PARAS, MD as Consulting Physician (Cardiology) Ashley Darryle JAYSON Mickey. (Inactive) as Referring Physician (Obstetrics and Gynecology) Cleatus Collar, MD as Consulting Physician (Ophthalmology) Skeet Juliene SAUNDERS, DO as Consulting Physician (Neurology)   Review of Systems  Constitutional:  Negative for chills, fatigue and fever.  HENT:  Negative for congestion, ear pain and sore throat.   Respiratory:  Negative for cough and shortness of breath.   Cardiovascular:  Negative for chest  pain.  Gastrointestinal:  Negative for abdominal pain, constipation, diarrhea, nausea and vomiting.  Genitourinary:  Negative for dysuria and urgency.  Musculoskeletal:  Negative for arthralgias and myalgias.  Skin:  Negative for rash.       THUMB SKIN CRACK  Neurological:  Negative for dizziness and headaches.  Psychiatric/Behavioral:  Negative for dysphoric mood. The patient is not nervous/anxious.     Medications Ordered Prior to Encounter[1] Past Medical History:  Diagnosis Date   Anxiety disorder 07/19/2019   Cancer (HCC) 1986   Ovarian   Cataract 2010   Chronic back pain 03/10/2018   Depression    Dyslipidemia 07/28/2019   Dyspnea on exertion  03/19/2019   Essential hypertension 03/19/2019   History of ovarian cancer    Hyperlipidemia 04/2015   Hypertension 02/2019   Hypothyroidism 03/10/2018   Impingement syndrome of right shoulder 05/12/2014   Insomnia due to other mental disorder 07/19/2019   Lumbar back pain 07/20/2019   Mixed hyperlipidemia 03/19/2019   Primary cough headache 03/21/2021   Right shoulder pain 05/12/2014   Right upper quadrant abdominal tenderness without rebound tenderness 01/29/2023   Sciatic nerve pain, left 07/19/2019   Sciatic nerve pain, right 07/19/2019   UTI (urinary tract infection)    Past Surgical History:  Procedure Laterality Date   ABDOMINAL HYSTERECTOMY  1986   Total, also had chemo   APPENDECTOMY  1986   ESOPHAGOGASTRODUODENOSCOPY     years ago   EYE SURGERY  2010   Cataract surgery   LAPAROTOMY  1987   To rule out recurrent ovarian cancer   TONSILLECTOMY      Family History  Problem Relation Age of Onset   Congestive Heart Failure Mother    Osteoarthritis Mother    Neuropathy Father    Esophageal cancer Neg Hx    Colon cancer Neg Hx    Social History   Socioeconomic History   Marital status: Married    Spouse name: Todd   Number of children: Not on file   Years of education: Not on file   Highest education level: Bachelor's degree (e.g., BA, AB, BS)  Occupational History   Not on file  Tobacco Use   Smoking status: Never   Smokeless tobacco: Never  Vaping Use   Vaping status: Never Used  Substance and Sexual Activity   Alcohol use: Yes    Alcohol/week: 1.0 standard drink of alcohol    Types: 1 Glasses of wine per week    Comment: occasionally   Drug use: Not Currently    Types: Solvent inhalants   Sexual activity: Yes    Partners: Male    Birth control/protection: None  Other Topics Concern   Not on file  Social History Narrative   Are you right handed or left handed? Right   Are you currently employed ? Retired   What is your current occupation?   Do  you live at home alone? N   Who lives with you? Husband   What type of home do you live in: 1 story or 2 story? 1       Social Drivers of Health   Tobacco Use: Low Risk (03/18/2024)   Patient History    Smoking Tobacco Use: Never    Smokeless Tobacco Use: Never    Passive Exposure: Not on file  Financial Resource Strain: Low Risk (03/14/2024)   Overall Financial Resource Strain (CARDIA)    Difficulty of Paying Living Expenses: Not hard at all  Food Insecurity: No  Food Insecurity (03/14/2024)   Epic    Worried About Programme Researcher, Broadcasting/film/video in the Last Year: Never true    Ran Out of Food in the Last Year: Never true  Transportation Needs: No Transportation Needs (03/14/2024)   Epic    Lack of Transportation (Medical): No    Lack of Transportation (Non-Medical): No  Physical Activity: Insufficiently Active (03/14/2024)   Exercise Vital Sign    Days of Exercise per Week: 2 days    Minutes of Exercise per Session: 30 min  Stress: No Stress Concern Present (03/14/2024)   [employer] of Occupational Health - Occupational Stress Questionnaire    Feeling of Stress: Only a little  Social Connections: Socially Integrated (03/14/2024)   Social Connection and Isolation Panel    Frequency of Communication with Friends and Family: More than three times a week    Frequency of Social Gatherings with Friends and Family: More than three times a week    Attends Religious Services: More than 4 times per year    Active Member of Clubs or Organizations: Yes    Attends Banker Meetings: More than 4 times per year    Marital Status: Married  Depression (PHQ2-9): Low Risk (09/29/2023)   Depression (PHQ2-9)    PHQ-2 Score: 1  Alcohol Screen: Low Risk (03/14/2024)   Alcohol Screen    Last Alcohol Screening Score (AUDIT): 3  Housing: Low Risk (03/18/2024)   Epic    Unable to Pay for Housing in the Last Year: No    Number of Times Moved in the Last Year: 0    Homeless in the Last Year: No   Utilities: Not At Risk (03/18/2024)   Epic    Threatened with loss of utilities: No  Health Literacy: Adequate Health Literacy (08/04/2023)   B1300 Health Literacy    Frequency of need for help with medical instructions: Never    Objective:  BP 124/72   Pulse 66   Temp 98.3 F (36.8 C)   Ht 5' 3 (1.6 m)   Wt 124 lb (56.2 kg)   LMP  (LMP Unknown)   SpO2 100%   BMI 21.97 kg/m      03/18/2024   11:29 AM 03/18/2024    7:55 AM 12/08/2023    8:06 AM  BP/Weight  Systolic BP 126 124 108  Diastolic BP 78 72 66  Wt. (Lbs) 123.6 124 117.8  BMI 21.89 kg/m2 21.97 kg/m2 20.87 kg/m2    Physical Exam Vitals reviewed.  Constitutional:      Appearance: Normal appearance. She is normal weight.  Neck:     Vascular: No carotid bruit.  Cardiovascular:     Rate and Rhythm: Normal rate and regular rhythm.     Heart sounds: Normal heart sounds.  Pulmonary:     Effort: Pulmonary effort is normal. No respiratory distress.     Breath sounds: Normal breath sounds.  Abdominal:     General: Abdomen is flat. Bowel sounds are normal.     Palpations: Abdomen is soft.     Tenderness: There is no abdominal tenderness.  Neurological:     Mental Status: She is alert and oriented to person, place, and time.  Psychiatric:        Mood and Affect: Mood normal.        Behavior: Behavior normal.         Lab Results  Component Value Date   WBC 5.7 03/18/2024   HGB 14.5 03/18/2024  HCT 42.8 03/18/2024   PLT 312 03/18/2024   GLUCOSE 100 (H) 03/18/2024   CHOL 174 08/04/2023   TRIG 84 08/04/2023   HDL 71 08/04/2023   LDLCALC 88 08/04/2023   ALT 12 03/18/2024   AST 22 03/18/2024   NA 141 03/18/2024   K 4.4 03/18/2024   CL 102 03/18/2024   CREATININE 0.87 03/18/2024   BUN 14 03/18/2024   CO2 24 03/18/2024   TSH 2.330 08/04/2023   HGBA1C 5.6 03/18/2024    Results for orders placed or performed in visit on 03/18/24  POCT glycosylated hemoglobin (Hb A1C)   Collection Time: 03/18/24  8:12  AM  Result Value Ref Range   Hemoglobin A1C     HbA1c POC (<> result, manual entry) 5.6 4.0 - 5.6 %   HbA1c, POC (prediabetic range)     HbA1c, POC (controlled diabetic range)    POCT Lipid Panel   Collection Time: 03/18/24  8:13 AM  Result Value Ref Range   TC 198    HDL 90    TRG 58    LDL 97    Non-HDL 108    TC/HDL    CBC with Differential/Platelet   Collection Time: 03/18/24  8:49 AM  Result Value Ref Range   WBC 5.7 3.4 - 10.8 x10E3/uL   RBC 4.58 3.77 - 5.28 x10E6/uL   Hemoglobin 14.5 11.1 - 15.9 g/dL   Hematocrit 57.1 65.9 - 46.6 %   MCV 93 79 - 97 fL   MCH 31.7 26.6 - 33.0 pg   MCHC 33.9 31.5 - 35.7 g/dL   RDW 86.9 88.2 - 84.5 %   Platelets 312 150 - 450 x10E3/uL   Neutrophils 62 Not Estab. %   Lymphs 27 Not Estab. %   Monocytes 8 Not Estab. %   Eos 2 Not Estab. %   Basos 1 Not Estab. %   Neutrophils Absolute 3.6 1.4 - 7.0 x10E3/uL   Lymphocytes Absolute 1.5 0.7 - 3.1 x10E3/uL   Monocytes Absolute 0.4 0.1 - 0.9 x10E3/uL   EOS (ABSOLUTE) 0.1 0.0 - 0.4 x10E3/uL   Basophils Absolute 0.1 0.0 - 0.2 x10E3/uL   Immature Granulocytes 0 Not Estab. %   Immature Grans (Abs) 0.0 0.0 - 0.1 x10E3/uL  Comprehensive metabolic panel with GFR   Collection Time: 03/18/24  8:49 AM  Result Value Ref Range   Glucose 100 (H) 70 - 99 mg/dL   BUN 14 8 - 27 mg/dL   Creatinine, Ser 9.12 0.57 - 1.00 mg/dL   eGFR 69 >40 fO/fpw/8.26   BUN/Creatinine Ratio 16 12 - 28   Sodium 141 134 - 144 mmol/L   Potassium 4.4 3.5 - 5.2 mmol/L   Chloride 102 96 - 106 mmol/L   CO2 24 20 - 29 mmol/L   Calcium  10.5 (H) 8.7 - 10.3 mg/dL   Total Protein 6.4 6.0 - 8.5 g/dL   Albumin 4.5 3.8 - 4.8 g/dL   Globulin, Total 1.9 1.5 - 4.5 g/dL   Bilirubin Total 0.8 0.0 - 1.2 mg/dL   Alkaline Phosphatase 63 49 - 135 IU/L   AST 22 0 - 40 IU/L   ALT 12 0 - 32 IU/L  .  Assessment & Plan:   Assessment & Plan Mixed hyperlipidemia Managed with rosuvastatin  and Zetia . - Continue rosuvastatin  5 mg daily. -  Continue Zetia . Orders:   POCT Lipid Panel   CBC with Differential/Platelet   Comprehensive metabolic panel with GFR  Prediabetes Continue to work on healthy  diet and exercise.  Well controlled.  Orders:   POCT glycosylated hemoglobin (Hb A1C)   Essential hypertension Well-controlled with Bystolic  and hydrochlorothiazide . - Continue Bystolic  5 mg daily. - Continue hydrochlorothiazide  25 mg daily.    Acquired hypothyroidism - Continue levothyroxine  50 mcg daily.    Dry skin Dry skin of the hands Chronic dry skin with a split on the thumb, exacerbated in winter. Current treatment causes burning. - Recommended O'Keeffe's Working Hands cream. - Suggested using Vaseline and wearing a plastic glove at night.     Body mass index is 21.97 kg/m.   No orders of the defined types were placed in this encounter.   Orders Placed This Encounter  Procedures   CBC with Differential/Platelet   Comprehensive metabolic panel with GFR   POCT Lipid Panel   POCT glycosylated hemoglobin (Hb A1C)     I,Marla I Leal-Borjas,acting as a scribe for Abigail Free, MD.,have documented all relevant documentation on the behalf of Abigail Free, MD,as directed by  Abigail Free, MD while in the presence of Abigail Free, MD.   Follow-up: Return in about 31 weeks (around 10/21/2024) for chronic follow up, awv.  An After Visit Summary was printed and given to the patient.  I attest that I have reviewed this visit and agree with the plan scribed by my staff.   Abigail Free, MD [facility] [phone]       [1]  Current Outpatient Medications on File Prior to Visit  Medication Sig Dispense Refill   ALPRAZolam  (XANAX ) 0.25 MG tablet TAKE 1 TABLET BY MOUTH AT BEDTIME AS NEEDED FOR ANXIETY. (Patient taking differently: Take 0.25 mg by mouth at bedtime as needed. TAKE 1 TABLET BY MOUTH AT BEDTIME AS NEEDED FOR ANXIETY.) 30 tablet 3   estradiol (ESTRACE) 0.1 MG/GM vaginal cream Place 1  Applicatorful vaginally daily.     ezetimibe  (ZETIA ) 10 MG tablet TAKE 1 TABLET BY MOUTH DAILY. 90 tablet 1   hydrochlorothiazide  (HYDRODIURIL ) 25 MG tablet TAKE 1 TABLET BY MOUTH DAILY. 90 tablet 1   levothyroxine  (SYNTHROID ) 50 MCG tablet Take 1 tablet (50 mcg total) by mouth daily. 90 tablet 2   nebivolol  (BYSTOLIC ) 5 MG tablet Take 1 tablet (5 mg total) by mouth daily. 90 tablet 2   rosuvastatin  (CRESTOR ) 5 MG tablet TAKE 1 TABLET BY MOUTH 3 TIMES A WEEK 36 tablet 1   No current facility-administered medications on file prior to visit.   "

## 2024-03-18 NOTE — Assessment & Plan Note (Addendum)
 Continue to work on [employer] and exercise.  Well controlled.  Orders:   POCT glycosylated hemoglobin (Hb A1C)

## 2024-03-18 NOTE — Assessment & Plan Note (Addendum)
 Continue levothyroxine 50 mcg daily

## 2024-03-18 NOTE — Assessment & Plan Note (Addendum)
 Dry skin of the hands Chronic dry skin with a split on the thumb, exacerbated in winter. Current treatment causes burning. - Recommended O'Keeffe's Working Hands cream. - Suggested using Vaseline and wearing a plastic glove at night.

## 2024-03-18 NOTE — Assessment & Plan Note (Addendum)
 Managed with rosuvastatin  and Zetia . - Continue rosuvastatin  5 mg daily. - Continue Zetia . Orders:   POCT Lipid Panel   CBC with Differential/Platelet   Comprehensive metabolic panel with GFR

## 2024-03-18 NOTE — Assessment & Plan Note (Addendum)
 Well-controlled with Bystolic  and hydrochlorothiazide . - Continue Bystolic  5 mg daily. - Continue hydrochlorothiazide  25 mg daily.

## 2024-03-19 ENCOUNTER — Ambulatory Visit: Payer: Self-pay | Admitting: Family Medicine

## 2024-04-05 ENCOUNTER — Other Ambulatory Visit

## 2024-04-08 ENCOUNTER — Ambulatory Visit: Admitting: "Endocrinology

## 2024-04-29 ENCOUNTER — Ambulatory Visit: Admitting: Cardiology

## 2024-10-19 ENCOUNTER — Ambulatory Visit: Admitting: Family Medicine
